# Patient Record
Sex: Male | Born: 1975 | Race: Black or African American | Hispanic: No | Marital: Single | Smoking: Never smoker
Health system: Southern US, Community
[De-identification: ages and names within clinical notes are randomized; demographics above are authoritative.]

## PROBLEM LIST (undated history)

## (undated) DIAGNOSIS — H8309 Labyrinthitis, unspecified ear: Secondary | ICD-10-CM

## (undated) DIAGNOSIS — T7840XA Allergy, unspecified, initial encounter: Secondary | ICD-10-CM

## (undated) DIAGNOSIS — K469 Unspecified abdominal hernia without obstruction or gangrene: Secondary | ICD-10-CM

## (undated) DIAGNOSIS — G5600 Carpal tunnel syndrome, unspecified upper limb: Secondary | ICD-10-CM

## (undated) DIAGNOSIS — K219 Gastro-esophageal reflux disease without esophagitis: Secondary | ICD-10-CM

## (undated) DIAGNOSIS — M1990 Unspecified osteoarthritis, unspecified site: Secondary | ICD-10-CM

## (undated) DIAGNOSIS — I10 Essential (primary) hypertension: Secondary | ICD-10-CM

## (undated) DIAGNOSIS — G4730 Sleep apnea, unspecified: Secondary | ICD-10-CM

## (undated) DIAGNOSIS — F419 Anxiety disorder, unspecified: Secondary | ICD-10-CM

## (undated) DIAGNOSIS — R51 Headache: Secondary | ICD-10-CM

## (undated) DIAGNOSIS — E876 Hypokalemia: Secondary | ICD-10-CM

## (undated) DIAGNOSIS — J329 Chronic sinusitis, unspecified: Secondary | ICD-10-CM

## (undated) DIAGNOSIS — G43909 Migraine, unspecified, not intractable, without status migrainosus: Secondary | ICD-10-CM

## (undated) DIAGNOSIS — F32A Depression, unspecified: Secondary | ICD-10-CM

## (undated) DIAGNOSIS — T63301A Toxic effect of unspecified spider venom, accidental (unintentional), initial encounter: Secondary | ICD-10-CM

## (undated) DIAGNOSIS — F329 Major depressive disorder, single episode, unspecified: Secondary | ICD-10-CM

## (undated) HISTORY — DX: Chronic sinusitis, unspecified: J32.9

## (undated) HISTORY — DX: Major depressive disorder, single episode, unspecified: F32.9

## (undated) HISTORY — DX: Allergy, unspecified, initial encounter: T78.40XA

## (undated) HISTORY — DX: Headache: R51

## (undated) HISTORY — DX: Unspecified abdominal hernia without obstruction or gangrene: K46.9

## (undated) HISTORY — DX: Anxiety disorder, unspecified: F41.9

## (undated) HISTORY — DX: Labyrinthitis, unspecified ear: H83.09

## (undated) HISTORY — DX: Unspecified osteoarthritis, unspecified site: M19.90

## (undated) HISTORY — DX: Hypokalemia: E87.6

## (undated) HISTORY — DX: Essential (primary) hypertension: I10

## (undated) HISTORY — DX: Carpal tunnel syndrome, unspecified upper limb: G56.00

## (undated) HISTORY — DX: Toxic effect of unspecified spider venom, accidental (unintentional), initial encounter: T63.301A

## (undated) HISTORY — DX: Gastro-esophageal reflux disease without esophagitis: K21.9

## (undated) HISTORY — DX: Depression, unspecified: F32.A

---
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
## 2003-09-10 ENCOUNTER — Emergency Department (HOSPITAL_COMMUNITY): Admission: EM | Admit: 2003-09-10 | Discharge: 2003-09-10 | Payer: Self-pay

## 2004-01-14 ENCOUNTER — Emergency Department (HOSPITAL_COMMUNITY): Admission: EM | Admit: 2004-01-14 | Discharge: 2004-01-14 | Payer: Self-pay | Admitting: Emergency Medicine

## 2006-08-31 ENCOUNTER — Emergency Department (HOSPITAL_COMMUNITY): Admission: EM | Admit: 2006-08-31 | Discharge: 2006-08-31 | Payer: Self-pay | Admitting: Family Medicine

## 2009-10-21 ENCOUNTER — Emergency Department (HOSPITAL_COMMUNITY): Admission: EM | Admit: 2009-10-21 | Discharge: 2009-10-21 | Payer: Self-pay | Admitting: Emergency Medicine

## 2010-07-09 ENCOUNTER — Encounter: Admission: RE | Admit: 2010-07-09 | Discharge: 2010-07-09 | Payer: Self-pay | Admitting: Internal Medicine

## 2010-09-19 ENCOUNTER — Encounter
Admission: RE | Admit: 2010-09-19 | Discharge: 2010-09-19 | Payer: Self-pay | Source: Home / Self Care | Attending: Surgery | Admitting: Surgery

## 2010-12-21 LAB — COMPREHENSIVE METABOLIC PANEL
Albumin: 4.1 g/dL (ref 3.5–5.2)
Alkaline Phosphatase: 70 U/L (ref 39–117)
BUN: 9 mg/dL (ref 6–23)
Potassium: 4.2 mEq/L (ref 3.5–5.1)
Sodium: 134 mEq/L — ABNORMAL LOW (ref 135–145)
Total Protein: 7.4 g/dL (ref 6.0–8.3)

## 2010-12-21 LAB — COMPREHENSIVE METABOLIC PANEL WITH GFR
ALT: 16 U/L (ref 0–53)
AST: 20 U/L (ref 0–37)
CO2: 27 meq/L (ref 19–32)
Calcium: 9.1 mg/dL (ref 8.4–10.5)
Chloride: 99 meq/L (ref 96–112)
Creatinine, Ser: 1.28 mg/dL (ref 0.4–1.5)
GFR calc Af Amer: >60 mL/min (ref >60)
GFR calc non Af Amer: >60 mL/min (ref >60)
Glucose, Bld: 96 mg/dL (ref 70–99)
Total Bilirubin: 1 mg/dL (ref 0.3–1.2)

## 2010-12-21 LAB — URINALYSIS, ROUTINE W REFLEX MICROSCOPIC
Bilirubin Urine: NEGATIVE
Glucose, UA: NEGATIVE mg/dL
Hgb urine dipstick: NEGATIVE
Ketones, ur: NEGATIVE mg/dL
Nitrite: NEGATIVE
Protein, ur: NEGATIVE mg/dL
Specific Gravity, Urine: 1.011 (ref 1.005–1.030)
Urobilinogen, UA: 0.2 mg/dL (ref 0.0–1.0)
pH: 7.5 (ref 5.0–8.0)

## 2010-12-21 LAB — DIFFERENTIAL
Basophils Absolute: 0 K/uL (ref 0.0–0.1)
Basophils Relative: 1 % (ref 0–1)
Eosinophils Absolute: 0.1 K/uL (ref 0.0–0.7)
Eosinophils Relative: 3 % (ref 0–5)
Lymphocytes Relative: 34 % (ref 12–46)
Lymphs Abs: 1.3 K/uL (ref 0.7–4.0)
Monocytes Absolute: 0.3 10*3/uL (ref 0.1–1.0)
Monocytes Relative: 9 % (ref 3–12)
Neutro Abs: 2 10*3/uL (ref 1.7–7.7)
Neutrophils Relative %: 53 % (ref 43–77)

## 2010-12-21 LAB — LIPASE, BLOOD: Lipase: 17 U/L (ref 11–59)

## 2010-12-21 LAB — POCT CARDIAC MARKERS
CKMB, poc: <1 ng/mL — ABNORMAL LOW (ref 1.0–8.0)
CKMB, poc: <1 ng/mL — ABNORMAL LOW (ref 1.0–8.0)
Myoglobin, poc: 82.8 ng/mL (ref 12–200)
Myoglobin, poc: 93.4 ng/mL (ref 12–200)
Troponin i, poc: 0.06 ng/mL (ref 0.00–0.09)
Troponin i, poc: <0.05 ng/mL (ref 0.00–0.09)

## 2010-12-21 LAB — CBC
HCT: 45.7 % (ref 39.0–52.0)
Hemoglobin: 15.2 g/dL (ref 13.0–17.0)
MCHC: 33.3 g/dL (ref 30.0–36.0)
MCV: 83.9 fL (ref 78.0–100.0)
Platelets: 232 10*3/uL (ref 150–400)
RBC: 5.45 MIL/uL (ref 4.22–5.81)
RDW: 14 % (ref 11.5–15.5)
WBC: 3.7 K/uL — ABNORMAL LOW (ref 4.0–10.5)

## 2010-12-21 LAB — D-DIMER, QUANTITATIVE: D-Dimer, Quant: <0.22 ug{FEU}/mL (ref 0.00–0.48)

## 2011-01-21 ENCOUNTER — Other Ambulatory Visit (HOSPITAL_COMMUNITY): Payer: Self-pay | Admitting: Surgery

## 2011-01-21 ENCOUNTER — Other Ambulatory Visit: Payer: Self-pay | Admitting: Surgery

## 2011-01-21 ENCOUNTER — Ambulatory Visit (HOSPITAL_COMMUNITY)
Admission: RE | Admit: 2011-01-21 | Discharge: 2011-01-21 | Disposition: A | Discharge status: Home / Self Care | Payer: BC Managed Care – PPO | Source: Ambulatory Visit | Attending: Surgery | Admitting: Surgery

## 2011-01-21 ENCOUNTER — Encounter (HOSPITAL_COMMUNITY): Payer: BC Managed Care – PPO

## 2011-01-21 DIAGNOSIS — K4090 Unilateral inguinal hernia, without obstruction or gangrene, not specified as recurrent: Secondary | ICD-10-CM | POA: Insufficient documentation

## 2011-01-21 DIAGNOSIS — Z01818 Encounter for other preprocedural examination: Secondary | ICD-10-CM | POA: Insufficient documentation

## 2011-01-21 DIAGNOSIS — Z01812 Encounter for preprocedural laboratory examination: Secondary | ICD-10-CM | POA: Insufficient documentation

## 2011-01-21 DIAGNOSIS — K469 Unspecified abdominal hernia without obstruction or gangrene: Secondary | ICD-10-CM

## 2011-01-21 DIAGNOSIS — Z01810 Encounter for preprocedural cardiovascular examination: Secondary | ICD-10-CM | POA: Insufficient documentation

## 2011-01-21 LAB — BASIC METABOLIC PANEL
BUN: 15 mg/dL (ref 6–23)
CO2: 31 mEq/L (ref 19–32)
Chloride: 98 mEq/L (ref 96–112)
Creatinine, Ser: 1.21 mg/dL (ref 0.4–1.5)
Glucose, Bld: 92 mg/dL (ref 70–99)

## 2011-01-23 LAB — MRSA CULTURE

## 2011-01-30 ENCOUNTER — Ambulatory Visit (HOSPITAL_COMMUNITY)
Admission: RE | Admit: 2011-01-30 | Discharge: 2011-01-30 | Disposition: A | Discharge status: Home / Self Care | Payer: BC Managed Care – PPO | Source: Ambulatory Visit | Attending: Surgery | Admitting: Surgery

## 2011-01-30 DIAGNOSIS — Z79899 Other long term (current) drug therapy: Secondary | ICD-10-CM | POA: Insufficient documentation

## 2011-01-30 DIAGNOSIS — K429 Umbilical hernia without obstruction or gangrene: Secondary | ICD-10-CM | POA: Insufficient documentation

## 2011-01-30 DIAGNOSIS — K219 Gastro-esophageal reflux disease without esophagitis: Secondary | ICD-10-CM | POA: Insufficient documentation

## 2011-01-30 DIAGNOSIS — K4090 Unilateral inguinal hernia, without obstruction or gangrene, not specified as recurrent: Secondary | ICD-10-CM | POA: Insufficient documentation

## 2011-01-30 DIAGNOSIS — I10 Essential (primary) hypertension: Secondary | ICD-10-CM | POA: Insufficient documentation

## 2011-01-30 HISTORY — PX: HERNIA REPAIR: SHX51

## 2011-02-01 NOTE — Op Note (Signed)
Dylan Guerrero, DROTAR                ACCOUNT NO.:  [id number]  MEDICAL RECORD NO.:  [id number]           PATIENT TYPE:  O  LOCATION:  DAYL                         FACILITY:  [facility] [place]  PHYSICIAN:  Wilmon Arms. Corliss Skains, M.D. DATE OF BIRTH:  1976/06/15  DATE OF PROCEDURE:  01/30/2011 DATE OF DISCHARGE:                              OPERATIVE REPORT   PREOPERATIVE DIAGNOSES: 1. Right inguinal hernia. 2. Umbilical hernia.  POSTOPERATIVE DIAGNOSES: 1. Right inguinal hernia. 2. Umbilical hernia.  PROCEDURE PERFORMED: 1. Right inguinal hernia repair with mesh. 2. Umbilical hernia repair with mesh.  SURGEON:  Wilmon Arms. Dulcy Sida, M.D.  ANESTHESIA:  General  INDICATIONS:  This is a 35 year old male who presents with a protruding umbilicus and some pain in his right groin.  He was found to have a reducible right inguinal hernia as well as a reducible umbilical hernia. The patient presents now for repair.  DESCRIPTION OF PROCEDURE:  The patient was brought to the operating room, placed in supine position on the operating table.  After an adequate level of general anesthesia was obtained, the patient's abdomen and right groin were shaved, prepped with ChloraPrep and Betadine and draped in sterile fashion.  Time-out was taken to ensure the proper patient, proper procedure.  We infiltrated the area of the right inguinal ligament with 0.25% Marcaine with epinephrine.  An oblique incision was made.  Dissection was carried down to the fascia. Then, we opened the external oblique fascia along the direction of its fibers down the external ring.  The spermatic cord was circumferentially dissected and retracted with a Penrose drain.  The floor of the inguinal canal was intact.  We skeletonized the spermatic cord and dissected a moderate-sized indirect hernia sac free.  We reduced this up to the internal ring.  We used a Parietex ProGrip mesh and secured this with 2- 0 Vicryl at its apex down to the  pubic tubercle.  We then positioned the mesh in the proper orientation and secured it with ProGrip self-adherent mesh.  This mesh seemed to be in good placement.  We closed the fascia with 2-0 Vicryl.  The 3-0 Vicryl was used to close the subcutaneous tissues and 4-0 Monocryl was used to close the skin.  We then turned our attention to the umbilicus.  The patient had a protuberant umbilicus.  We made a transverse incision above the umbilicus.  Dissection was carried down into the subcutaneous tissues with cautery.  We identified the hernia sac.  We dissected all the way down to the fascia.  The defect initially seemed to be about 2 cm across.  We opened a 4.3-cm Proceed ventral patch.  However, as we were clearing the fascia in all directions to place the mesh, we identified 2 further defects.  One was above and one was below this original hernia defect.  We dissected these completely and then connected all the defects to make one large common defect.  This measured 6 cm.  All these hernias contained only omentum.  We did enter the peritoneal cavity. Therefore, we made the decision not to use the Proceed ventral patch as it  was too small.  We closed the fascia in vertical fashion with figure- of-eight interrupted #1 Novofil sutures.  We then cut Ultrapro into 7 x 3 cm strip and secured this as onlay with 0 Novofil sutures.  We closed the subcutaneous tissue anterior to the mesh with 3-0 Vicryl.  The base of the umbilicus was tacked down with 3-0 Vicryl.  A 4-0 Monocryl was used to close the skin.  Steri-Strips and clean dressings were applied. The patient was then extubated and brought to recovery room in stable condition.  All sponge, instrument and needle counts were correct.     Wilmon Arms. Corliss Skains, M.D.     MKT/MEDQ  D:  01/30/2011  T:  01/30/2011  Job:  [id number]  Electronically Signed by Manus Rudd M.D. on 02/01/2011 10:09:21 PM

## 2011-02-07 ENCOUNTER — Emergency Department (HOSPITAL_COMMUNITY)
Admission: EM | Admit: 2011-02-07 | Discharge: 2011-02-07 | Disposition: A | Discharge status: Home / Self Care | Payer: BC Managed Care – PPO | Attending: Emergency Medicine | Admitting: Emergency Medicine

## 2011-02-07 DIAGNOSIS — Z09 Encounter for follow-up examination after completed treatment for conditions other than malignant neoplasm: Secondary | ICD-10-CM | POA: Insufficient documentation

## 2011-02-07 DIAGNOSIS — T8132XA Disruption of internal operation (surgical) wound, not elsewhere classified, initial encounter: Secondary | ICD-10-CM | POA: Insufficient documentation

## 2011-02-07 DIAGNOSIS — I10 Essential (primary) hypertension: Secondary | ICD-10-CM | POA: Insufficient documentation

## 2011-02-07 DIAGNOSIS — Y838 Other surgical procedures as the cause of abnormal reaction of the patient, or of later complication, without mention of misadventure at the time of the procedure: Secondary | ICD-10-CM | POA: Insufficient documentation

## 2011-02-07 DIAGNOSIS — T81329A Deep disruption or dehiscence of operation wound, unspecified, initial encounter: Secondary | ICD-10-CM | POA: Insufficient documentation

## 2011-02-12 ENCOUNTER — Emergency Department (HOSPITAL_COMMUNITY)
Admission: EM | Admit: 2011-02-12 | Discharge: 2011-02-12 | Disposition: A | Discharge status: Home / Self Care | Payer: BC Managed Care – PPO | Attending: Emergency Medicine | Admitting: Emergency Medicine

## 2011-02-12 DIAGNOSIS — IMO0002 Reserved for concepts with insufficient information to code with codable children: Secondary | ICD-10-CM | POA: Insufficient documentation

## 2011-02-12 DIAGNOSIS — Y838 Other surgical procedures as the cause of abnormal reaction of the patient, or of later complication, without mention of misadventure at the time of the procedure: Secondary | ICD-10-CM | POA: Insufficient documentation

## 2011-10-21 ENCOUNTER — Encounter (INDEPENDENT_AMBULATORY_CARE_PROVIDER_SITE_OTHER): Payer: Self-pay | Admitting: Surgery

## 2011-10-22 ENCOUNTER — Ambulatory Visit (INDEPENDENT_AMBULATORY_CARE_PROVIDER_SITE_OTHER): Payer: BC Managed Care – PPO | Admitting: Surgery

## 2011-10-22 ENCOUNTER — Encounter (INDEPENDENT_AMBULATORY_CARE_PROVIDER_SITE_OTHER): Payer: Self-pay | Admitting: Surgery

## 2011-10-22 VITALS — BP 132/90 | HR 66 | Temp 98.0°F | Resp 18 | Ht 74.0 in | Wt 242.4 lb

## 2011-10-22 DIAGNOSIS — R109 Unspecified abdominal pain: Secondary | ICD-10-CM

## 2011-10-22 DIAGNOSIS — R1033 Periumbilical pain: Secondary | ICD-10-CM

## 2011-10-22 NOTE — Progress Notes (Signed)
This patient underwent right inguinal hernia repair with mesh as well as an umbilical hernia repair with mesh on 01/30/11. The umbilical hernia actually had 3 separate fascial defects which were all connected to a long 6 cm defect. This was repaired with figure-of-eight interrupted #1 Novafil sutures reinforced with onlay ultra Pro mesh. The patient had been doing quite well with no symptoms until about a week ago. He felt a pulling sensation just to the right of his umbilicus and then describes what he feels as warm fluid on his skin. However there was no actual drainage on the skin, just the sensation. He comes in today for evaluation. The patient is currently taking Naprosyn for tendinitis in his arm. He continues to work as a Midwife.  On examination, his incisions are well-healed with no sign of infection.  No obvious recurrent umbilical hernia.  No movement or protrusion with valsalva maneuver.    Imp:  No obvious hernia recurrence.  He may have pulled on one of the fascial sutures and caused some irritation to the muscle or a sensory nerve.    Recs:  NSAIDs, abdominal binder, no heavy lifting.  Recheck in 3 weeks.  Wilmon Arms. Corliss Skains, MD, [facility]  10/22/2011 9:40 AM

## 2011-10-22 NOTE — Patient Instructions (Signed)
Elastic abdominal binder may help with the pulling sensation. Use Naprosyn on a regular basis

## 2011-11-12 ENCOUNTER — Encounter (INDEPENDENT_AMBULATORY_CARE_PROVIDER_SITE_OTHER): Payer: Self-pay | Admitting: Surgery

## 2011-11-12 ENCOUNTER — Encounter (INDEPENDENT_AMBULATORY_CARE_PROVIDER_SITE_OTHER): Payer: BC Managed Care – PPO | Admitting: Surgery

## 2011-11-12 ENCOUNTER — Ambulatory Visit (INDEPENDENT_AMBULATORY_CARE_PROVIDER_SITE_OTHER): Payer: BC Managed Care – PPO | Admitting: Surgery

## 2011-11-12 VITALS — BP 126/82 | HR 80 | Temp 98.6°F | Resp 14 | Ht 74.0 in | Wt 244.2 lb

## 2011-11-12 DIAGNOSIS — R109 Unspecified abdominal pain: Secondary | ICD-10-CM

## 2011-11-12 DIAGNOSIS — R1033 Periumbilical pain: Secondary | ICD-10-CM

## 2011-11-12 NOTE — Progress Notes (Signed)
The right periumbilical pain is better with the abdominal binder.  No sign of recurrent hernia.   Incision well-healed  Resume full activity Follow-up PRN

## 2013-01-09 ENCOUNTER — Ambulatory Visit (INDEPENDENT_AMBULATORY_CARE_PROVIDER_SITE_OTHER): Payer: BC Managed Care – PPO | Admitting: Diagnostic Neuroimaging

## 2013-01-09 ENCOUNTER — Encounter: Payer: Self-pay | Admitting: Diagnostic Neuroimaging

## 2013-01-09 VITALS — BP 128/85 | HR 87 | Temp 99.0°F | Ht 75.0 in | Wt 252.0 lb

## 2013-01-09 DIAGNOSIS — G43109 Migraine with aura, not intractable, without status migrainosus: Secondary | ICD-10-CM

## 2013-01-09 MED ORDER — SUMATRIPTAN SUCCINATE 100 MG PO TABS: 100.0000 mg | ORAL_TABLET | Freq: Once | ORAL | Status: DC | PRN

## 2013-01-09 MED ORDER — PROPRANOLOL HCL ER 60 MG PO CP24: 60.0000 mg | ORAL_CAPSULE | Freq: Every day | ORAL | Status: DC

## 2013-01-09 NOTE — Patient Instructions (Signed)
Migraine Headache A migraine headache is an intense, throbbing pain on one or both sides of your head. A migraine can last for 30 minutes to several hours. CAUSES  The exact cause of a migraine headache is not always known. However, a migraine may be caused when nerves in the brain become irritated and release chemicals that cause inflammation. This causes pain. SYMPTOMS  Pain on one or both sides of your head.  Pulsating or throbbing pain.  Severe pain that prevents daily activities.  Pain that is aggravated by any physical activity.  Nausea, vomiting, or both.  Dizziness.  Pain with exposure to bright lights, loud noises, or activity.  General sensitivity to bright lights, loud noises, or smells. Before you get a migraine, you may get warning signs that a migraine is coming (aura). An aura may include:  Seeing flashing lights.  Seeing bright spots, halos, or zig-zag lines.  Having tunnel vision or blurred vision.  Having feelings of numbness or tingling.  Having trouble talking.  Having muscle weakness. MIGRAINE TRIGGERS  Alcohol.  Smoking.  Stress.  Menstruation.  Aged cheeses.  Foods or drinks that contain nitrates, glutamate, aspartame, or tyramine.  Lack of sleep.  Chocolate.  Caffeine.  Hunger.  Physical exertion.  Fatigue.  Medicines used to treat chest pain (nitroglycerine), birth control pills, estrogen, and some blood pressure medicines. DIAGNOSIS  A migraine headache is often diagnosed based on:  Symptoms.  Physical examination.  A CT scan or MRI of your head. TREATMENT Medicines may be given for pain and nausea. Medicines can also be given to help prevent recurrent migraines.  HOME CARE INSTRUCTIONS  Only take over-the-counter or prescription medicines for pain or discomfort as directed by your caregiver. The use of long-term narcotics is not recommended.  Lie down in a dark, quiet room when you have a migraine.  Keep a journal  to find out what may trigger your migraine headaches. For example, write down:  What you eat and drink.  How much sleep you get.  Any change to your diet or medicines.  Limit alcohol consumption.  Quit smoking if you smoke.  Get 7 to 9 hours of sleep, or as recommended by your caregiver.  Limit stress.  Keep lights dim if bright lights bother you and make your migraines worse. SEEK IMMEDIATE MEDICAL CARE IF:   Your migraine becomes severe.  You have a fever.  You have a stiff neck.  You have vision loss.  You have muscular weakness or loss of muscle control.  You start losing your balance or have trouble walking.  You feel faint or pass out.  You have severe symptoms that are different from your first symptoms. MAKE SURE YOU:   Understand these instructions.  Will watch your condition.  Will get help right away if you are not doing well or get worse. Document Released: 09/21/2005 Document Revised: 12/14/2011 Document Reviewed: 09/11/2011 Exit[employer] Patient Information 2013 [employer].  

## 2013-01-09 NOTE — Progress Notes (Signed)
[facility]  PATIENT: Dylan Guerrero DOB: 03/15/1976  REFERRING CLINICIAN: Pollyann Kennedy HISTORY FROM: patient REASON FOR VISIT: new consult   HISTORICAL  CHIEF COMPLAINT:  Chief Complaint  Patient presents with  . Headache  . Dizziness    HISTORY OF PRESENT ILLNESS:   37 year old right-handed male with history of hypertension, here for evaluation of headaches and dizziness.  Patient reports one to 2 year history of intermittent right-sided headaches, pounding throbbing sensation, radiating to the right eye. Headaches last up to 30 minutes at a time. He has hazy vision, flashing light sensation, photophobia, rocking back and forth sensation. No nausea vomiting or sensitivity to sound. He has up to 20 days of these headaches and symptoms per month.  Patient has tried meclizine, tramadol, ibuprofen without relief. In June 2013 he was out of town near [address], had severe headache and vertigo, had MRI of the brain which is unremarkable.  Patient has not tried topiramate, propranolol, or any sumatriptan for the symptoms.  REVIEW OF SYSTEMS: Full 14 system review of systems performed and notable only for spinning sensation, skin moles, eye pain, easy bruising, ecchymosis, allergies, dizziness, headache.  ALLERGIES: Allergies  Allergen Reactions  . Ketoprofen Other (See Comments)    Causes muscle and stomach cramps    HOME MEDICATIONS: Outpatient Prescriptions Prior to Visit  Medication Sig Dispense Refill  . ibuprofen (ADVIL,MOTRIN) 800 MG tablet Take 800 mg by mouth every 8 (eight) hours as needed.      . loratadine (CLARITIN) 10 MG tablet Take 10 mg by mouth daily.      . Olopatadine HCl (PATADAY) 0.2 % SOLN Apply to eye daily.      Marland Kitchen dexlansoprazole (DEXILANT) 60 MG capsule Take 60 mg by mouth daily.      . fluticasone (FLONASE) 50 MCG/ACT nasal spray Place 2 sprays into the nose 2 (two) times daily.      . hydrochlorothiazide (HYDRODIURIL)  25 MG tablet Take 25 mg by mouth daily.      . naproxen (NAPROSYN) 500 MG tablet Take 500 mg by mouth 2 (two) times daily with a meal.       No facility-administered medications prior to visit.    PAST MEDICAL HISTORY: Past Medical History  Diagnosis Date  . GERD (gastroesophageal reflux disease)   . Hypertension   . Allergy   . Spider bite   . Headache   . Arthritis   . Abdominal pain   . Hernia   . Carpal tunnel syndrome   . Infection of the inner ear   . Sinus infection   . Hypokalemia     PAST SURGICAL HISTORY: Past Surgical History  Procedure Laterality Date  . Appendectomy  1984  . Hernia repair  01/30/2011    Right Inguinal Hernia repair with mesh  . Hernia repair  01/30/2011    Umbilical Hernia repair with meash    FAMILY HISTORY: Family History  Problem Relation Age of Onset  . Scleroderma Mother   . Diabetes    . Hypertension      SOCIAL HISTORY:  History   Social History  . Marital Status: Single    Spouse Name: N/A    Number of Children: 0  . Years of Education: college   Occupational History  . BUS DRIVER [employer]   Social History Main Topics  . Smoking status: Never Smoker   . Smokeless tobacco: Never Used  . Alcohol Use: No  Comment: quit 10/05/2009  . Drug Use: No  . Sexually Active: Not on file   Other Topics Concern  . Not on file   Social History Narrative   Pt lives at home alone.   Caffeine Use- Does not use.     PHYSICAL EXAM  Filed Vitals:   01/09/13 1124 01/09/13 1129  BP: 132/87 128/85  Pulse: 91 87  Temp: 99 F (37.2 C)   TempSrc: Oral   Height: 6\' 3"  (1.905 m)   Weight: 252 lb (114.306 kg)    Body mass index is 31.5 kg/(m^2).  GENERAL EXAM: Patient is in no distress  CARDIOVASCULAR: Regular rate and rhythm, no murmurs, no carotid bruits  NEUROLOGIC: MENTAL STATUS: awake, alert, language fluent, comprehension intact, naming intact CRANIAL NERVE: no papilledema on fundoscopic exam,  pupils equal and reactive to light, visual fields full to confrontation, extraocular muscles intact, no nystagmus, facial sensation and strength symmetric, uvula midline, shoulder shrug symmetric, tongue midline. MOTOR: normal bulk and tone, full strength in the BUE, BLE SENSORY: normal and symmetric to light touch, pinprick, temperature, vibration COORDINATION: finger-nose-finger, fine finger movements normal REFLEXES: deep tendon reflexes present and symmetric GAIT/STATION: narrow based gait; able to walk on toes, heels and tandem; romberg is negative   DIAGNOSTIC DATA (LABS, IMAGING, TESTING) - I reviewed patient records, labs, notes, testing and imaging myself where available.  Lab Results  Component Value Date   WBC 3.7* 10/21/2009   HGB 15.2 10/21/2009   HCT 45.7 10/21/2009   MCV 83.9 10/21/2009   PLT 232 10/21/2009      Component Value Date/Time   NA 138 01/21/2011 1010   K 3.5 01/21/2011 1010   CL 98 01/21/2011 1010   CO2 31 01/21/2011 1010   GLUCOSE 92 01/21/2011 1010   BUN 15 01/21/2011 1010   CREATININE 1.21 01/21/2011 1010   CALCIUM 9.4 01/21/2011 1010   PROT 7.4 10/21/2009 0935   ALBUMIN 4.1 10/21/2009 0935   AST 20 10/21/2009 0935   ALT 16 10/21/2009 0935   ALKPHOS 70 10/21/2009 0935   BILITOT 1.0 10/21/2009 0935   GFRNONAA >60 01/21/2011 1010   GFRAA  Value: >60        The eGFR has been calculated using the MDRD equation. This calculation has not been validated in all clinical situations. eGFR's persistently <60 mL/min signify possible Chronic Kidney Disease. 01/21/2011 1010   No results found for this basename: CHOL, HDL, LDLCALC, LDLDIRECT, TRIG, CHOLHDL   No results found for this basename: HGBA1C   No results found for this basename: VITAMINB12   No results found for this basename: TSH     ASSESSMENT AND PLAN  37 y.o. year old male  has a past medical history of GERD (gastroesophageal reflux disease); Hypertension; Allergy; Spider bite; Headache; Arthritis; Abdominal  pain; Hernia; Carpal tunnel syndrome; Infection of the inner ear; Sinus infection; and Hypokalemia. here with one to 2 year history of intermittent headaches with migraine features. Neurologic exam unremarkable. Patient reports normal MRI of the brain in June 2013, although do not have the report or images to review myself.  PLAN: 1. Start propranolol plus sumatriptan 2. Migraine education, triggers and treatment strategies reviewed   Suanne Marker, MD 01/09/2013, 11:41 AM Certified in Neurology, Neurophysiology and Neuroimaging  [facility] [address], [address] [place], [place] [address] [phone]

## 2013-01-12 ENCOUNTER — Encounter: Payer: Self-pay | Admitting: Diagnostic Neuroimaging

## 2013-01-12 NOTE — Telephone Encounter (Signed)
I called pt and he is having tingling and jerking of both legs since starting the propranolol (Monday night), increasingly worse as time as gone on.  He takes another dose tonight at 1700.   I told him that I would send message to Dr. Marjory Lies.   Did not see these SE in reference book.  Will call back or will email.

## 2013-08-10 ENCOUNTER — Other Ambulatory Visit: Payer: Self-pay

## 2014-01-21 ENCOUNTER — Other Ambulatory Visit: Payer: Self-pay | Admitting: Diagnostic Neuroimaging

## 2015-09-15 ENCOUNTER — Encounter (HOSPITAL_COMMUNITY): Payer: Self-pay

## 2015-09-15 ENCOUNTER — Emergency Department (HOSPITAL_COMMUNITY)
Admission: EM | Admit: 2015-09-15 | Discharge: 2015-09-16 | Disposition: A | Discharge status: Home / Self Care | Payer: BC Managed Care – PPO | Attending: Emergency Medicine | Admitting: Emergency Medicine

## 2015-09-15 DIAGNOSIS — R05 Cough: Secondary | ICD-10-CM | POA: Insufficient documentation

## 2015-09-15 DIAGNOSIS — M1990 Unspecified osteoarthritis, unspecified site: Secondary | ICD-10-CM | POA: Diagnosis not present

## 2015-09-15 DIAGNOSIS — Z8639 Personal history of other endocrine, nutritional and metabolic disease: Secondary | ICD-10-CM | POA: Insufficient documentation

## 2015-09-15 DIAGNOSIS — Z79899 Other long term (current) drug therapy: Secondary | ICD-10-CM | POA: Insufficient documentation

## 2015-09-15 DIAGNOSIS — G43109 Migraine with aura, not intractable, without status migrainosus: Secondary | ICD-10-CM | POA: Diagnosis not present

## 2015-09-15 DIAGNOSIS — Z87828 Personal history of other (healed) physical injury and trauma: Secondary | ICD-10-CM | POA: Diagnosis not present

## 2015-09-15 DIAGNOSIS — R002 Palpitations: Secondary | ICD-10-CM | POA: Insufficient documentation

## 2015-09-15 DIAGNOSIS — Z8709 Personal history of other diseases of the respiratory system: Secondary | ICD-10-CM | POA: Diagnosis not present

## 2015-09-15 DIAGNOSIS — Z7951 Long term (current) use of inhaled steroids: Secondary | ICD-10-CM | POA: Insufficient documentation

## 2015-09-15 DIAGNOSIS — K219 Gastro-esophageal reflux disease without esophagitis: Secondary | ICD-10-CM | POA: Insufficient documentation

## 2015-09-15 DIAGNOSIS — I10 Essential (primary) hypertension: Secondary | ICD-10-CM | POA: Insufficient documentation

## 2015-09-15 DIAGNOSIS — R0602 Shortness of breath: Secondary | ICD-10-CM | POA: Insufficient documentation

## 2015-09-15 DIAGNOSIS — R51 Headache: Secondary | ICD-10-CM | POA: Diagnosis present

## 2015-09-15 MED ORDER — SODIUM CHLORIDE 0.9 % IV BOLUS (SEPSIS)
500.0000 mL | Freq: Once | INTRAVENOUS | Status: AC
  Administered 2015-09-15: 500 mL via INTRAVENOUS

## 2015-09-15 MED ORDER — PROMETHAZINE HCL 25 MG/ML IJ SOLN
25.0000 mg | Freq: Once | INTRAMUSCULAR | Status: AC
  Administered 2015-09-15: 25 mg via INTRAVENOUS
  Filled 2015-09-15: qty 1

## 2015-09-15 MED ORDER — DIPHENHYDRAMINE HCL 50 MG/ML IJ SOLN
25.0000 mg | Freq: Once | INTRAMUSCULAR | Status: AC
  Administered 2015-09-15: 25 mg via INTRAVENOUS
  Filled 2015-09-15: qty 1

## 2015-09-15 NOTE — ED Notes (Signed)
MD at bedside. 

## 2015-09-15 NOTE — ED Provider Notes (Signed)
CSN: [id number]     Arrival date & time 09/15/15  2201 History   First MD Initiated Contact with Patient 09/15/15 2219     Chief Complaint  Patient presents with  . Migraine     (Consider location/radiation/quality/duration/timing/severity/associated sxs/prior Treatment) Patient is a 39 y.o. male presenting with migraines. The history is provided by the patient.  Migraine This is a recurrent problem. Associated symptoms include headaches and shortness of breath. Pertinent negatives include no chest pain and no abdominal pain.   patient presents with right-sided headache. Began around 2 days ago. Has a history of somewhat frequent headaches. He is seemed neurologist for it and is on Imitrex and propranolol. States he started back on this around 2 weeks ago after a break. Has had some nausea. Some photophobia. No numbness weakness. States he does have some chronic vision changes in his right eye. It is been associated with migraines but is very limited assessment of the headache. He has occasionally felt his heart racing. No real chest pain. No trouble breathing. Occasional shortness of breath. Has had occasional cough. No abdominal pain. No trauma. No relief with his propranolol or Imitrex  Past Medical History  Diagnosis Date  . GERD (gastroesophageal reflux disease)   . Hypertension   . Allergy   . Spider bite   . Headache(784.0)   . Arthritis   . Abdominal pain   . Hernia   . Carpal tunnel syndrome   . Infection of the inner ear   . Sinus infection   . Hypokalemia    Past Surgical History  Procedure Laterality Date  . Appendectomy  1984  . Hernia repair  01/30/2011    Right Inguinal Hernia repair with mesh  . Hernia repair  01/30/2011    Umbilical Hernia repair with meash   Family History  Problem Relation Age of Onset  . Scleroderma Mother   . Diabetes    . Hypertension     Social History  Substance Use Topics  . Smoking status: Never Smoker   . Smokeless tobacco:  Never Used  . Alcohol Use: No     Comment: quit 10/05/2009    Review of Systems  Constitutional: Negative for activity change and appetite change.  Eyes: Positive for photophobia and visual disturbance. Negative for pain.  Respiratory: Positive for shortness of breath. Negative for chest tightness.   Cardiovascular: Negative for chest pain and leg swelling.  Gastrointestinal: Positive for nausea. Negative for vomiting, abdominal pain and diarrhea.  Genitourinary: Negative for flank pain.  Musculoskeletal: Negative for back pain and neck stiffness.  Skin: Negative for rash.  Neurological: Positive for headaches. Negative for weakness and numbness.  Psychiatric/Behavioral: Negative for behavioral problems.      Allergies  Ketoprofen  Home Medications   Prior to Admission medications   Medication Sig Start Date End Date Taking? Authorizing Provider  amLODipine (NORVASC) 10 MG tablet Take 10 mg by mouth daily.   Yes Historical Provider, MD  cycloSPORINE (RESTASIS) 0.05 % ophthalmic emulsion Place 1 drop into both eyes 2 (two) times daily.   Yes Historical Provider, MD  fluticasone (FLONASE) 50 MCG/ACT nasal spray Place 2 sprays into the nose 2 (two) times daily.   Yes Historical Provider, MD  hydrochlorothiazide (HYDRODIURIL) 25 MG tablet Take 25 mg by mouth daily.   Yes Historical Provider, MD  ibuprofen (ADVIL,MOTRIN) 800 MG tablet Take 800 mg by mouth every 8 (eight) hours as needed for moderate pain.    Yes Historical Provider, MD  loratadine (CLARITIN) 10 MG tablet Take 10 mg by mouth daily.   Yes Historical Provider, MD  meclizine (ANTIVERT) 25 MG tablet Take 25 mg by mouth 3 (three) times daily as needed for dizziness or nausea.    Yes Historical Provider, MD  Multiple Vitamins-Minerals (MENS MULTIVITAMIN PLUS PO) Take 1 tablet by mouth daily.   Yes Historical Provider, MD  naproxen (NAPROSYN) 500 MG tablet Take 500 mg by mouth 2 (two) times daily as needed for mild pain.    Yes  Historical Provider, MD  omeprazole (PRILOSEC) 20 MG capsule Take 20 mg by mouth daily.   Yes Historical Provider, MD  propranolol ER (INDERAL LA) 60 MG 24 hr capsule take 1 capsule by mouth once daily 01/21/14  Yes Suanne Marker, MD  SUMAtriptan (IMITREX) 100 MG tablet Take 1 tablet (100 mg total) by mouth once as needed for migraine. May repeat x 1 after 2 hours; maximum 2 tabs per day and 8 tabs per month 01/09/13  Yes Vikram R Penumalli, MD  traMADol (ULTRAM) 50 MG tablet Take 50 mg by mouth every 6 (six) hours as needed for pain.   Yes Historical Provider, MD  vitamin C (ASCORBIC ACID) 250 MG tablet Take 250 mg by mouth daily.   Yes Historical Provider, MD   BP 93/68 mmHg  Pulse 65  Temp(Src) 97.9 F (36.6 C) (Oral)  Resp 18  Ht  (1.88 m)  Wt 263 lb (119.296 kg)  BMI 33.75 kg/m2  SpO2 98% Physical Exam  Constitutional: He is oriented to person, place, and time. He appears well-developed and well-nourished.  HENT:  Head: Normocephalic and atraumatic.  Eyes: Pupils are equal, round, and reactive to light.  Neck: Normal range of motion. Neck supple.  Cardiovascular: Normal rate, regular rhythm and normal heart sounds.   No murmur heard. Pulmonary/Chest: Effort normal and breath sounds normal. He has no wheezes. He exhibits no tenderness.  Abdominal: Soft. Bowel sounds are normal. He exhibits no distension and no mass. There is no tenderness. There is no rebound and no guarding.  Musculoskeletal: Normal range of motion. He exhibits no edema.  Neurological: He is alert and oriented to person, place, and time. No cranial nerve deficit.  Skin: Skin is warm and dry.  Psychiatric: He has a normal mood and affect.  Nursing note and vitals reviewed.   ED Course  Procedures (including critical care time) Labs Review Labs Reviewed - No data to display  Imaging Review No results found. I have personally reviewed and evaluated these images and lab results as part of my medical  decision-making.   EKG Interpretation   Date/Time:  Sunday September 15 2015 22:04:19 EST Ventricular Rate:  86 PR Interval:  172 QRS Duration: 74 QT Interval:  354 QTC Calculation: 423 R Axis:   64 Text Interpretation:  Normal sinus rhythm Normal ECG Confirmed by  Rubin Payor  MD, Harrold Donath [phone]) on 09/15/2015 11:12:07 PM      MDM   Final diagnoses:  Migraine with aura and without status migrainosus, not intractable    Patient migraine. History of same. Feels better after treatment will discharge home.    Benjiman Core, MD 09/18/15 [phone]

## 2015-09-15 NOTE — ED Notes (Signed)
Pt states he has had a migraine all day on the right side and states he felt his HR speed up just when he was sitting down not doing anything. Denies any N/V, endorses sensitivity to light.

## 2015-09-16 MED ORDER — KETOROLAC TROMETHAMINE 30 MG/ML IJ SOLN
30.0000 mg | Freq: Once | INTRAMUSCULAR | Status: AC
  Administered 2015-09-16: 30 mg via INTRAVENOUS
  Filled 2015-09-16: qty 1

## 2015-09-16 NOTE — ED Notes (Signed)
Pt left at this time with all belongings.  

## 2015-09-16 NOTE — Discharge Instructions (Signed)

## 2015-10-01 ENCOUNTER — Encounter: Payer: Self-pay | Admitting: Diagnostic Neuroimaging

## 2015-10-01 ENCOUNTER — Ambulatory Visit (INDEPENDENT_AMBULATORY_CARE_PROVIDER_SITE_OTHER): Payer: BC Managed Care – PPO | Admitting: Diagnostic Neuroimaging

## 2015-10-01 VITALS — BP 112/79 | HR 71 | Ht 75.0 in | Wt 266.0 lb

## 2015-10-01 DIAGNOSIS — G43109 Migraine with aura, not intractable, without status migrainosus: Secondary | ICD-10-CM | POA: Diagnosis not present

## 2015-10-01 NOTE — Patient Instructions (Signed)
Thank you for coming to see [place] at [facility]. I hope we have been able to provide you high quality care today.  You may receive a patient satisfaction survey over the next few weeks. We would appreciate your feedback and comments so that we may continue to improve ourselves and the health of our patients.  - continue propranolol and naproxyn   ~~~~~~~~~~~~~~~~~~~~~~~~~~~~~~~~~~~~~~~~~~~~~~~~~~~~~~~~~~~~~~~~~  DR. Jaishon Krisher'S GUIDE TO HAPPY AND HEALTHY LIVING These are some of my general health and wellness recommendations. Some of them may apply to you better than others. Please use common sense as you try these suggestions and feel free to ask me any questions.   ACTIVITY/FITNESS Mental, social, emotional and physical stimulation are very important for brain and body health. Try learning a new activity (arts, music, language, sports, games).  Keep moving your body to the best of your abilities. You can do this at home, inside or outside, the park, community center, gym or anywhere you like. Consider a physical therapist or personal trainer to get started. Consider the app Sworkit. Fitness trackers such as smart-watches, smart-phones or Fitbits can help as well.   NUTRITION Eat more plants: colorful vegetables, nuts, seeds and berries.  Eat less sugar, salt, preservatives and processed foods.  Avoid toxins such as cigarettes and alcohol.  Drink water when you are thirsty. Warm water with a slice of lemon is an excellent morning drink to start the day.  Consider these websites for more information The Nutrition Source (https://www.henry-hernandez.biz/) Precision Nutrition (WindowBlog.ch)   RELAXATION Consider practicing mindfulness meditation or other relaxation techniques such as deep breathing, prayer, yoga, tai chi, massage. See website mindful.org or the apps Headspace or Calm to help get started.   SLEEP Try to  get at least 7-8+ hours sleep per day. Regular exercise and reduced caffeine will help you sleep better. Practice good sleep hygeine techniques. See website sleep.org for more information.   PLANNING Prepare estate planning, living will, healthcare POA documents. Sometimes this is best planned with the help of an attorney. Theconversationproject.org and agingwithdignity.org are excellent resources.\

## 2015-10-01 NOTE — Progress Notes (Signed)
[facility]  PATIENT: Dylan Guerrero DOB: 05/06/1976  REFERRING CLINICIAN:  HISTORY FROM: patient  REASON FOR VISIT: follow up   HISTORICAL  CHIEF COMPLAINT:  Chief Complaint  Patient presents with  . Migraine    rm 7, "daily HA, migraine on R side of head"  . Follow-up    8 month    HISTORY OF PRESENT ILLNESS:   UPDATE 10/01/15: Since last visit, was doing well and HA resolved on propranolol, so stopped it back in 2014. Then HA returned in 2016. Now back on propranolol x 2-3 months. Sumatriptan was too strong. Overall HA getting better. Naproxyn works well (5-10 x per month). Having ~ 5-10 days HA per month.  PRIOR HPI (01/09/13): 39 year old right-handed male with history of hypertension, here for evaluation of headaches and dizziness. Patient reports one to 2 year history of intermittent right-sided headaches, pounding throbbing sensation, radiating to the right eye. Headaches last up to 30 minutes at a time. He has hazy vision, flashing light sensation, photophobia, rocking back and forth sensation. No nausea vomiting or sensitivity to sound. He has up to 20 days of these headaches and symptoms per month. Patient has tried meclizine, tramadol, ibuprofen without relief. In June 2013 he was out of town near the [place], had severe headache and vertigo, had MRI of the brain which is unremarkable. Patient has not tried topiramate, propranolol, or any sumatriptan for the symptoms.  REVIEW OF SYSTEMS: Full 14 system review of systems performed and notable only for dizziness headache tremors light sens ear pain.    ALLERGIES: Allergies  Allergen Reactions  . Ibuprofen Other (See Comments)    reflux  . Ketoprofen Other (See Comments)    Causes muscle and stomach cramps    HOME MEDICATIONS: Outpatient Prescriptions Prior to Visit  Medication Sig Dispense Refill  . amLODipine (NORVASC) 10 MG tablet Take 10 mg by mouth daily.    . cycloSPORINE  (RESTASIS) 0.05 % ophthalmic emulsion Place 1 drop into both eyes 2 (two) times daily.    . fluticasone (FLONASE) 50 MCG/ACT nasal spray Place 2 sprays into the nose 2 (two) times daily.    . hydrochlorothiazide (HYDRODIURIL) 25 MG tablet Take 25 mg by mouth daily.    Marland Kitchen loratadine (CLARITIN) 10 MG tablet Take 10 mg by mouth daily.    . meclizine (ANTIVERT) 25 MG tablet Take 25 mg by mouth 3 (three) times daily as needed for dizziness or nausea.     . Multiple Vitamins-Minerals (MENS MULTIVITAMIN PLUS PO) Take 1 tablet by mouth daily.    . naproxen (NAPROSYN) 500 MG tablet Take 500 mg by mouth 2 (two) times daily as needed for mild pain.     Marland Kitchen omeprazole (PRILOSEC) 20 MG capsule Take 20 mg by mouth daily.    . propranolol ER (INDERAL LA) 60 MG 24 hr capsule take 1 capsule by mouth once daily 30 capsule 0  . SUMAtriptan (IMITREX) 100 MG tablet Take 1 tablet (100 mg total) by mouth once as needed for migraine. May repeat x 1 after 2 hours; maximum 2 tabs per day and 8 tabs per month 8 tablet 6  . traMADol (ULTRAM) 50 MG tablet Take 50 mg by mouth every 6 (six) hours as needed for pain.    . vitamin C (ASCORBIC ACID) 250 MG tablet Take 250 mg by mouth daily.    Marland Kitchen ibuprofen (ADVIL,MOTRIN) 800 MG tablet Take 800 mg by mouth every 8 (eight) hours  as needed for moderate pain.      No facility-administered medications prior to visit.    PAST MEDICAL HISTORY: Past Medical History  Diagnosis Date  . GERD (gastroesophageal reflux disease)   . Hypertension   . Allergy   . Spider bite   . Headache(784.0)   . Arthritis   . Abdominal pain   . Hernia   . Carpal tunnel syndrome   . Infection of the inner ear   . Sinus infection   . Hypokalemia     PAST SURGICAL HISTORY: Past Surgical History  Procedure Laterality Date  . Appendectomy  1984  . Hernia repair  01/30/2011    Right Inguinal Hernia repair with mesh  . Hernia repair  98/33/8250    Umbilical Hernia repair with meash    FAMILY  HISTORY: Family History  Problem Relation Age of Onset  . Scleroderma Mother   . Diabetes    . Hypertension      SOCIAL HISTORY:  Social History   Social History  . Marital Status: Single    Spouse Name: N/A  . Number of Children: 0  . Years of Education: college   Occupational History  . [employer]   Social History Main Topics  . Smoking status: Never Smoker   . Smokeless tobacco: Never Used  . Alcohol Use: No     Comment: quit 10/05/2009  . Drug Use: No  . Sexual Activity: Not on file   Other Topics Concern  . Not on file   Social History Narrative   Pt lives at home alone.   Caffeine Use- Does not use.     PHYSICAL EXAM  GENERAL EXAM/CONSTITUTIONAL: Vitals:  Filed Vitals:   10/01/15 1524  BP: 112/79  Pulse: 71  Height: 6' 3"  (1.905 m)  Weight: 266 lb (120.657 kg)     Body mass index is 33.25 kg/(m^2).  No exam data present  Patient is in no distress; well developed, nourished and groomed; neck is supple  CARDIOVASCULAR:  Examination of carotid arteries is normal; no carotid bruits  Regular rate and rhythm, no murmurs  Examination of peripheral vascular system by observation and palpation is normal  EYES:  Ophthalmoscopic exam of optic discs and posterior segments is normal; no papilledema or hemorrhages  MUSCULOSKELETAL:  Gait, strength, tone, movements noted in Neurologic exam below  NEUROLOGIC: MENTAL STATUS:  No flowsheet data found.  awake, alert, oriented to person, place and time  recent and remote memory intact  normal attention and concentration  language fluent, comprehension intact, naming intact,   fund of knowledge appropriate  CRANIAL NERVE:   2nd - no papilledema on fundoscopic exam  2nd, 3rd, 4th, 6th - pupils equal and reactive to light, visual fields full to confrontation, extraocular muscles intact, no nystagmus  5th - facial sensation symmetric  7th - facial strength  symmetric  8th - hearing intact  9th - palate elevates symmetrically, uvula midline  11th - shoulder shrug symmetric  12th - tongue protrusion midline  MOTOR:   normal bulk and tone, full strength in the BUE, BLE  SENSORY:   normal and symmetric to light touch, pinprick, temperature, vibration   COORDINATION:   finger-nose-finger, fine finger movements normal  REFLEXES:   deep tendon reflexes present and symmetric  GAIT/STATION:   narrow based gait; romberg is negative    DIAGNOSTIC DATA (LABS, IMAGING, TESTING) - I reviewed patient records, labs, notes, testing and imaging myself where available.  Lab  Results  Component Value Date   WBC 3.7* 10/21/2009   HGB 15.2 10/21/2009   HCT 45.7 10/21/2009   MCV 83.9 10/21/2009   PLT 232 10/21/2009      Component Value Date/Time   NA 138 01/21/2011 1010   K 3.5 01/21/2011 1010   CL 98 01/21/2011 1010   CO2 31 01/21/2011 1010   GLUCOSE 92 01/21/2011 1010   BUN 15 01/21/2011 1010   CREATININE 1.21 01/21/2011 1010   CALCIUM 9.4 01/21/2011 1010   PROT 7.4 10/21/2009 0935   ALBUMIN 4.1 10/21/2009 0935   AST 20 10/21/2009 0935   ALT 16 10/21/2009 0935   ALKPHOS 70 10/21/2009 0935   BILITOT 1.0 10/21/2009 0935   GFRNONAA >60 01/21/2011 1010   GFRAA  01/21/2011 1010    >60        The eGFR has been calculated using the MDRD equation. This calculation has not been validated in all clinical situations. eGFR's persistently <60 mL/min signify possible Chronic Kidney Disease.   No results found for: CHOL, HDL, LDLCALC, LDLDIRECT, TRIG, CHOLHDL No results found for: HGBA1C No results found for: VITAMINB12 No results found for: TSH   09/23/15 CT head  - No acute intracranial abnormalities.     ASSESSMENT AND PLAN  39 y.o. year old male here with here with intermittent headaches with migraine features since 2012. Neurologic exam unremarkable. Patient reports normal MRI of the brain in June 2013, although do  not have the report or images to review myself. CT head from 09/23/15 normal. HA improved with propranolol treatment. Sumatriptan not tolerated ("too strong").  PLAN: 1. Continue propranolol + naproxyn prn 2. Migraine education, triggers and treatment strategies reviewed 3. May consider sleep study in future (daytime fatigue, obesity, HTN)  Return in about 3 months (around 12/30/2015).    Penni Bombard, MD 08/65/7846, 9:62 PM Certified in Neurology, Neurophysiology and Neuroimaging  [facility] [address], [place], [place] [address] [phone]

## 2015-11-28 ENCOUNTER — Telehealth: Payer: Self-pay | Admitting: Diagnostic Neuroimaging

## 2015-11-28 MED ORDER — PROPRANOLOL HCL ER 60 MG PO CP24: 60.0000 mg | ORAL_CAPSULE | Freq: Every day | ORAL | Status: DC

## 2015-11-28 NOTE — Telephone Encounter (Signed)
Patient is calling to get a new Rx for propranolol ER (INDERAL LA) 60 MG 24 hr capsule called to [facility] on [address].

## 2016-01-03 ENCOUNTER — Ambulatory Visit (INDEPENDENT_AMBULATORY_CARE_PROVIDER_SITE_OTHER): Payer: BC Managed Care – PPO | Admitting: Diagnostic Neuroimaging

## 2016-01-03 ENCOUNTER — Encounter: Payer: Self-pay | Admitting: Diagnostic Neuroimaging

## 2016-01-03 VITALS — BP 130/93 | HR 89 | Ht 75.0 in | Wt 263.0 lb

## 2016-01-03 DIAGNOSIS — G4719 Other hypersomnia: Secondary | ICD-10-CM | POA: Diagnosis not present

## 2016-01-03 DIAGNOSIS — G43109 Migraine with aura, not intractable, without status migrainosus: Secondary | ICD-10-CM | POA: Diagnosis not present

## 2016-01-03 DIAGNOSIS — I10 Essential (primary) hypertension: Secondary | ICD-10-CM | POA: Diagnosis not present

## 2016-01-03 DIAGNOSIS — E669 Obesity, unspecified: Secondary | ICD-10-CM

## 2016-01-03 MED ORDER — PROPRANOLOL HCL ER 60 MG PO CP24: 60.0000 mg | ORAL_CAPSULE | Freq: Every day | ORAL | Status: DC

## 2016-01-03 NOTE — Patient Instructions (Signed)
Thank you for coming to see [place] at [facility]. I hope we have been able to provide you high quality care today.  You may receive a patient satisfaction survey over the next few weeks. We would appreciate your feedback and comments so that we may continue to improve ourselves and the health of our patients.  - continue propranolol - I will check sleep study    ~~~~~~~~~~~~~~~~~~~~~~~~~~~~~~~~~~~~~~~~~~~~~~~~~~~~~~~~~~~~~~~~~  DR. Breelyn Icard'S GUIDE TO HAPPY AND HEALTHY LIVING These are some of my general health and wellness recommendations. Some of them may apply to you better than others. Please use common sense as you try these suggestions and feel free to ask me any questions.   ACTIVITY/FITNESS Mental, social, emotional and physical stimulation are very important for brain and body health. Try learning a new activity (arts, music, language, sports, games).  Keep moving your body to the best of your abilities. You can do this at home, inside or outside, the park, community center, gym or anywhere you like. Consider a physical therapist or personal trainer to get started. Consider the app Sworkit. Fitness trackers such as smart-watches, smart-phones or Fitbits can help as well.   NUTRITION Eat more plants: colorful vegetables, nuts, seeds and berries.  Eat less sugar, salt, preservatives and processed foods.  Avoid toxins such as cigarettes and alcohol.  Drink water when you are thirsty. Warm water with a slice of lemon is an excellent morning drink to start the day.  Consider these websites for more information The Nutrition Source (https://www.henry-hernandez.biz/) Precision Nutrition (WindowBlog.ch)   RELAXATION Consider practicing mindfulness meditation or other relaxation techniques such as deep breathing, prayer, yoga, tai chi, massage. See website mindful.org or the apps Headspace or Calm to help get  started.   SLEEP Try to get at least 7-8+ hours sleep per day. Regular exercise and reduced caffeine will help you sleep better. Practice good sleep hygeine techniques. See website sleep.org for more information.   PLANNING Prepare estate planning, living will, healthcare POA documents. Sometimes this is best planned with the help of an attorney. Theconversationproject.org and agingwithdignity.org are excellent resources.

## 2016-01-03 NOTE — Progress Notes (Signed)
[facility]  PATIENT: Dylan Guerrero DOB: 1976-09-09  REFERRING CLINICIAN:  HISTORY FROM: patient  REASON FOR VISIT: follow up   HISTORICAL  CHIEF COMPLAINT:  Chief Complaint  Patient presents with  . Migraine    rm 6, having migraine maybe once every 2 wks, not as steady as before"  . Follow-up    3 month    HISTORY OF PRESENT ILLNESS:   UPDATE 01/03/16: Since last visit, HA improved. Now with 2 days HA per month. Now on flonase, claritin for allergies and sinus issues, and he thinks this is also helping HA. Tolerating other meds. Still with daytime sleepiness.   UPDATE 10/01/15: Since last visit, was doing well and HA resolved on propranolol, so stopped it back in 2014. Then HA returned in 2016. Now back on propranolol x 2-3 months. Sumatriptan was too strong. Overall HA getting better. Naproxyn works well (5-10 x per month). Having ~ 5-10 days HA per month.  PRIOR HPI (01/09/13): 40 year old right-handed male with history of hypertension, here for evaluation of headaches and dizziness. Patient reports one to 2 year history of intermittent right-sided headaches, pounding throbbing sensation, radiating to the right eye. Headaches last up to 30 minutes at a time. He has hazy vision, flashing light sensation, photophobia, rocking back and forth sensation. No nausea vomiting or sensitivity to sound. He has up to 20 days of these headaches and symptoms per month. Patient has tried meclizine, tramadol, ibuprofen without relief. In June 2013 he was out of town near the [place], had severe headache and vertigo, had MRI of the brain which is unremarkable. Patient has not tried topiramate, propranolol, or any sumatriptan for the symptoms.  REVIEW OF SYSTEMS: Full 14 system review of systems performed and negative except for headache light sens ear pain.    ALLERGIES: Allergies  Allergen Reactions  . Ibuprofen Other (See Comments)    reflux  . Ketoprofen  Other (See Comments)    Causes muscle and stomach cramps    HOME MEDICATIONS: Outpatient Prescriptions Prior to Visit  Medication Sig Dispense Refill  . amLODipine (NORVASC) 10 MG tablet Take 10 mg by mouth daily.    . cycloSPORINE (RESTASIS) 0.05 % ophthalmic emulsion Place 1 drop into both eyes 2 (two) times daily.    . fluticasone (FLONASE) 50 MCG/ACT nasal spray Place 2 sprays into the nose 2 (two) times daily.    . hydrochlorothiazide (HYDRODIURIL) 25 MG tablet Take 25 mg by mouth daily.    Marland Kitchen loratadine (CLARITIN) 10 MG tablet Take 10 mg by mouth daily.    . meclizine (ANTIVERT) 25 MG tablet Take 25 mg by mouth 3 (three) times daily as needed for dizziness or nausea.     . Multiple Vitamins-Minerals (MENS MULTIVITAMIN PLUS PO) Take 1 tablet by mouth daily.    . naproxen (NAPROSYN) 500 MG tablet Take 500 mg by mouth 2 (two) times daily as needed for mild pain.     Marland Kitchen omeprazole (PRILOSEC) 20 MG capsule Take 20 mg by mouth daily.    . propranolol ER (INDERAL LA) 60 MG 24 hr capsule Take 1 capsule (60 mg total) by mouth daily. 90 capsule 4  . vitamin C (ASCORBIC ACID) 250 MG tablet Take 250 mg by mouth daily.    . traMADol (ULTRAM) 50 MG tablet Take 50 mg by mouth every 6 (six) hours as needed for pain.     No facility-administered medications prior to visit.    PAST  MEDICAL HISTORY: Past Medical History  Diagnosis Date  . GERD (gastroesophageal reflux disease)   . Hypertension   . Allergy   . Spider bite   . Headache(784.0)   . Arthritis   . Abdominal pain   . Hernia   . Carpal tunnel syndrome   . Infection of the inner ear   . Sinus infection   . Hypokalemia     PAST SURGICAL HISTORY: Past Surgical History  Procedure Laterality Date  . Appendectomy  1984  . Hernia repair  01/30/2011    Right Inguinal Hernia repair with mesh  . Hernia repair  01/30/2011    Umbilical Hernia repair with meash    FAMILY HISTORY: Family History  Problem Relation Age of Onset  .  Scleroderma Mother   . Diabetes    . Hypertension      SOCIAL HISTORY:  Social History   Social History  . Marital Status: Single    Spouse Name: N/A  . Number of Children: 0  . 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 Smoking status: Never Smoker   . Smokeless tobacco: Never Used  . Alcohol Use: No     Comment: quit 10/05/2009  . Drug Use: No  . Sexual Activity: Not on file   Other Topics Concern  . Not on file   Social History Narrative   Pt lives at home alone.   Caffeine Use- Does not use.     PHYSICAL EXAM  GENERAL EXAM/CONSTITUTIONAL: Vitals:  Filed Vitals:   01/03/16 0943  BP: 130/93  Pulse: 89  Height: 6' 3" (1.905 m)  Weight: 263 lb (119.296 kg)   Body mass index is 32.87 kg/(m^2). No exam data present  Patient is in no distress; well developed, nourished and groomed; neck is supple  CARDIOVASCULAR:  Examination of carotid arteries is normal; no carotid bruits  Regular rate and rhythm, no murmurs  Examination of peripheral vascular system by observation and palpation is normal  EYES:  Ophthalmoscopic exam of optic discs and posterior segments is normal; no papilledema or hemorrhages  MUSCULOSKELETAL:  Gait, strength, tone, movements noted in Neurologic exam below  NEUROLOGIC: MENTAL STATUS:  No flowsheet data found.  awake, alert, oriented to person, place and time  recent and remote memory intact  normal attention and concentration  language fluent, comprehension intact, naming intact,   fund of knowledge appropriate  CRANIAL NERVE:   2nd - no papilledema on fundoscopic exam  2nd, 3rd, 4th, 6th - pupils equal and reactive to light, visual fields full to confrontation, extraocular muscles intact, no nystagmus  5th - facial sensation symmetric  7th - facial strength symmetric  8th - hearing intact  9th - palate elevates symmetrically,  uvula midline  11th - shoulder shrug symmetric  12th - tongue protrusion midline  MOTOR:   normal bulk and tone, full strength in the BUE, BLE  SENSORY:   normal and symmetric to light touch, temperature, vibration   COORDINATION:   finger-nose-finger, fine finger movements normal  REFLEXES:   deep tendon reflexes TRACE and symmetric  GAIT/STATION:   narrow based gait; TANDEM STABLE     DIAGNOSTIC DATA (LABS, IMAGING, TESTING) - I reviewed patient records, labs, notes, testing and imaging myself where available.  Lab Results  Component Value Date   WBC 3.7* 10/21/2009   HGB 15.2 10/21/2009   HCT 45.7 10/21/2009   MCV 83.9   10/21/2009   PLT 232 10/21/2009      Component Value Date/Time   NA 138 01/21/2011 1010   K 3.5 01/21/2011 1010   CL 98 01/21/2011 1010   CO2 31 01/21/2011 1010   GLUCOSE 92 01/21/2011 1010   BUN 15 01/21/2011 1010   CREATININE 1.21 01/21/2011 1010   CALCIUM 9.4 01/21/2011 1010   PROT 7.4 10/21/2009 0935   ALBUMIN 4.1 10/21/2009 0935   AST 20 10/21/2009 0935   ALT 16 10/21/2009 0935   ALKPHOS 70 10/21/2009 0935   BILITOT 1.0 10/21/2009 0935   GFRNONAA >60 01/21/2011 1010   GFRAA  01/21/2011 1010    >60        The eGFR has been calculated using the MDRD equation. This calculation has not been validated in all clinical situations. eGFR's persistently <60 mL/min signify possible Chronic Kidney Disease.   No results found for: CHOL, HDL, LDLCALC, LDLDIRECT, TRIG, CHOLHDL No results found for: HGBA1C No results found for: VITAMINB12 No results found for: TSH   09/16/15 EKG [I reviewed images myself and agree with interpretation. -VRP]  - normal sinus rhythm  09/23/15 CT head  - No acute intracranial abnormalities.     ASSESSMENT AND PLAN  40 y.o. year old male here with here with intermittent headaches with migraine features since 2012. Neurologic exam unremarkable. Patient reports normal MRI of the brain in June 2013,  although do not have the report or images to review myself. CT head from 09/23/15 normal. HA improved with propranolol treatment. Sumatriptan not tolerated ("too strong").  Dx:  Migraine with aura and without status migrainosus, not intractable  Excessive daytime sleepiness  Accelerated hypertension  Obesity    PLAN: 1. Continue propranolol + naproxyn prn 2. Migraine education, triggers and treatment strategies reviewed 3. Check sleep study (daytime fatigue, obesity, HTN)  Orders Placed This Encounter  Procedures  . Ambulatory referral to Sleep Studies   Meds ordered this encounter  Medications  . propranolol ER (INDERAL LA) 60 MG 24 hr capsule    Sig: Take 1 capsule (60 mg total) by mouth daily.    Dispense:  90 capsule    Refill:  4   Return in about 3 months (around 04/03/2016).    Penni Bombard, MD 8/85/0277, 41:28 AM Certified in Neurology, Neurophysiology and Neuroimaging  [facility] [address] [place], [place] [address] [phone]

## 2016-01-09 ENCOUNTER — Institutional Professional Consult (permissible substitution): Payer: BC Managed Care – PPO | Admitting: Neurology

## 2016-02-13 ENCOUNTER — Institutional Professional Consult (permissible substitution): Payer: BC Managed Care – PPO | Admitting: Neurology

## 2016-03-17 ENCOUNTER — Ambulatory Visit (INDEPENDENT_AMBULATORY_CARE_PROVIDER_SITE_OTHER): Payer: BC Managed Care – PPO | Admitting: Neurology

## 2016-03-17 ENCOUNTER — Encounter: Payer: Self-pay | Admitting: Neurology

## 2016-03-17 VITALS — BP 116/82 | HR 90 | Resp 18 | Ht 75.0 in | Wt 262.0 lb

## 2016-03-17 DIAGNOSIS — R519 Headache, unspecified: Secondary | ICD-10-CM

## 2016-03-17 DIAGNOSIS — R51 Headache: Secondary | ICD-10-CM

## 2016-03-17 DIAGNOSIS — E669 Obesity, unspecified: Secondary | ICD-10-CM

## 2016-03-17 DIAGNOSIS — R0902 Hypoxemia: Secondary | ICD-10-CM | POA: Diagnosis not present

## 2016-03-17 DIAGNOSIS — R351 Nocturia: Secondary | ICD-10-CM | POA: Diagnosis not present

## 2016-03-17 DIAGNOSIS — G4734 Idiopathic sleep related nonobstructive alveolar hypoventilation: Secondary | ICD-10-CM

## 2016-03-17 DIAGNOSIS — G478 Other sleep disorders: Secondary | ICD-10-CM

## 2016-03-17 NOTE — Patient Instructions (Signed)

## 2016-03-17 NOTE — Progress Notes (Signed)
Subjective:    Patient ID: Dylan Guerrero is a 40 y.o. male.  HPI     Huston Foley, MD, PhD [facility] [address] [address] [place], [place] [address]  Dear Dylan Guerrero,   I saw your patient, Dylan Guerrero, upon your kind request in my clinic today for initial consultation of his sleep disturbance, in particular, concern for underlying obstructive sleep apnea. The patient is unaccompanied today. As you know, Dylan Guerrero is a 39 year old right-handed gentleman with an underlying medical history of recurrent headaches, hypertension, allergies, arthritis, history of sinus infection, reflux disease, and obesity, who reports possible snoring and an abnormal overnight pulse oximetry test recently. I reviewed your office note from 01/03/2016.  He denies morning headaches, has nocturia about once per night on average, does not always wake up rested, feels either adequately to marginally rested typically. Tries to go to bed by 8:30 and puts his TV on the 90 minute timer, typically is asleep about 30-45 minutes into it. Wakeup time is 4:30 AM. His weight has been fluctuating. Epworth sleepiness score is 1 out of 24 today, his fatigue score is 21 out of 63. Primary care physician ordered an overnight pulse oximetry test recently in April 2017. Results are not available for my review today but patient verbally reports that findings were mildly abnormal and PCP talked to him about potentially doing a sleep study, and the diagnosis of OSA and treatment options including CPAP at the time.  The patient is single and works as a Midwife for [employer]. He is a nonsmoker. He lives alone. He does not drink caffeine on a regular basis (maybe one cup per week) or alcohol on a regular basis, in fact quit alcohol in 2011, never heavy drinker. Has no pets, no children, no FHx of OSA. No RLS Sx.  His Past Medical History Is Significant For: Past Medical History  Diagnosis  Date  . GERD (gastroesophageal reflux disease)   . Hypertension   . Allergy   . Spider bite   . Headache(784.0)   . Arthritis   . Abdominal pain   . Hernia   . Carpal tunnel syndrome   . Infection of the inner ear   . Sinus infection   . Hypokalemia     Her Past Surgical History Is Significant For: Past Surgical History  Procedure Laterality Date  . Appendectomy  1984  . Hernia repair  01/30/2011    Right Inguinal Hernia repair with mesh  . Hernia repair  01/30/2011    Umbilical Hernia repair with meash    His Family History Is Significant For: Family History  Problem Relation Age of Onset  . Scleroderma Mother   . Diabetes    . Hypertension      His Social History Is Significant For: Social History   Social History  . Marital Status: Single    Spouse Name: N/A  . Number of Children: 0  . Years of Education: college   Occupational History  . BUS DRIVER [employer]   Social History Main Topics  . Smoking status: Never Smoker   . Smokeless tobacco: Never Used  . Alcohol Use: No     Comment: quit 10/05/2009  . Drug Use: No  . Sexual Activity: Not Asked   Other Topics Concern  . None   Social History Narrative   Pt lives at home alone.   Caffeine Use- maybe 1 cup of coffee a week  His Allergies Are:  Allergies  Allergen Reactions  . Ibuprofen Other (See Comments)    reflux  . Ketoprofen Other (See Comments)    Causes muscle and stomach cramps  :   His Current Medications Are:  Outpatient Encounter Prescriptions as of 03/17/2016  Medication Sig  . amLODipine (NORVASC) 10 MG tablet Take 10 mg by mouth daily.  . cycloSPORINE (RESTASIS) 0.05 % ophthalmic emulsion Place 1 drop into both eyes 2 (two) times daily.  . fluticasone (FLONASE) 50 MCG/ACT nasal spray Place 2 sprays into the nose 2 (two) times daily.  . hydrochlorothiazide (HYDRODIURIL) 25 MG tablet Take 25 mg by mouth daily.  Marland Kitchen levocetirizine (XYZAL) 5 MG tablet Take 5 mg by  mouth every evening.  . meclizine (ANTIVERT) 25 MG tablet Take 25 mg by mouth 3 (three) times daily as needed for dizziness or nausea.   . Multiple Vitamins-Minerals (MENS MULTIVITAMIN PLUS PO) Take 1 tablet by mouth daily.  . naproxen (NAPROSYN) 500 MG tablet Take 500 mg by mouth 2 (two) times daily as needed for mild pain.   Marland Kitchen omeprazole (PRILOSEC) 20 MG capsule Take 20 mg by mouth daily.  . propranolol ER (INDERAL LA) 60 MG 24 hr capsule Take 1 capsule (60 mg total) by mouth daily.  . vitamin C (ASCORBIC ACID) 250 MG tablet Take 250 mg by mouth daily.  . [DISCONTINUED] loratadine (CLARITIN) 10 MG tablet Take 10 mg by mouth daily.   No facility-administered encounter medications on file as of 03/17/2016.  :  Review of Systems:  Out of a complete 14 point review of systems, all are reviewed and negative with the exception of these symptoms as listed below:   Review of Systems  Neurological:       No trouble falling or staying asleep, headaches, unable to take naps during the day  Epworth Sleepiness Scale 0= would never doze 1= slight chance of dozing 2= moderate chance of dozing 3= high chance of dozing  Sitting and reading:0 Watching TV:0 Sitting inactive in a public place (ex. Theater or meeting):0 As a passenger in a car for an hour without a break:0 Lying down to rest in the afternoon:1 Sitting and talking to someone:0 Sitting quietly after lunch (no alcohol):0 In a car, while stopped in traffic:0 Total:1   Objective:  Neurologic Exam  Physical Exam Physical Examination:   Filed Vitals:   03/17/16 1058  BP: 116/82  Pulse: 90  Resp: 18   General Examination: The patient is a very pleasant 39 y.o. male in no acute distress. He appears well-developed and well-nourished and well groomed.   HEENT: Normocephalic, atraumatic, pupils are equal, round and reactive to light and accommodation. Funduscopic exam is normal with sharp disc margins noted. Extraocular tracking is  good without limitation to gaze excursion or nystagmus noted. Normal smooth pursuit is noted. Hearing is grossly intact. Tympanic membranes are clear bilaterally. Face is symmetric with normal facial animation and normal facial sensation. Speech is clear with no dysarthria noted. There is no hypophonia. There is no lip, neck/head, jaw or voice tremor. Neck is supple with full range of passive and active motion. There are no carotid bruits on auscultation. Oropharynx exam reveals: mild mouth dryness, adequate to marginal dental hygiene and moderate airway crowding, due to larger tongue, large uvula, which is also fairly elongated, tonsils in place, left a little larger than right. Mallampati is class II. Tongue protrudes centrally and palate elevates symmetrically. Tonsils are 1-2+ bilaterally. Neck circumference is 17-5/8  inches. No overbite, maybe mild underbite.   Chest: Clear to auscultation without wheezing, rhonchi or crackles noted.  Heart: S1+S2+0, regular and normal without murmurs, rubs or gallops noted.   Abdomen: Soft, non-tender and non-distended with normal bowel sounds appreciated on auscultation.  Extremities: There is no pitting edema in the distal lower extremities bilaterally. Pedal pulses are intact.  Skin: Warm and dry without trophic changes noted. There are no varicose veins.  Musculoskeletal: exam reveals no obvious joint deformities, tenderness or joint swelling or erythema.   Neurologically:  Mental status: The patient is awake, alert and oriented in all 4 spheres. His immediate and remote memory, attention, language skills and fund of knowledge are appropriate. There is no evidence of aphasia, agnosia, apraxia or anomia. Speech is clear with normal prosody and enunciation. Thought process is linear. Mood is normal and affect is normal.  Cranial nerves II - XII are as described above under HEENT exam. In addition: shoulder shrug is normal with equal shoulder height  noted. Motor exam: Normal bulk, strength and tone is noted. There is no drift, tremor or rebound. Romberg is negative. Reflexes are 2+ throughout. Babinski: Toes are flexor bilaterally. Fine motor skills and coordination: intact with normal finger taps, normal hand movements, normal rapid alternating patting, normal foot taps and normal foot agility.  Cerebellar testing: No dysmetria or intention tremor on finger to nose testing. Heel to shin is unremarkable bilaterally. There is no truncal or gait ataxia.  Sensory exam: intact to light touch, pinprick, vibration, temperature sense in the upper and lower extremities.  Gait, station and balance: He stands easily. No veering to one side is noted. No leaning to one side is noted. Posture is age-appropriate and stance is narrow based. Gait shows normal stride length and normal pace. No problems turning are noted. He turns en bloc. Tandem walk is unremarkable.   Assessment and Plan:  In summary, WALKER SITAR is a very pleasant 40 y.o.-year old male with an underlying medical history of recurrent headaches, hypertension, allergies, arthritis, history of sinus infection, reflux disease, and obesity, who reports possible snoring and an abnormal overnight pulse oximetry test recently. His history and physical exam are concerning for obstructive sleep apnea (OSA). I had a long chat with the patient about my findings and the diagnosis of OSA, its prognosis and treatment options. We talked about medical treatments, surgical interventions and non-pharmacological approaches. I explained in particular the risks and ramifications of untreated moderate to severe OSA, especially with respect to developing cardiovascular disease down the Road, including congestive heart failure, difficult to treat hypertension, cardiac arrhythmias, or stroke. Even type 2 diabetes has, in part, been linked to untreated OSA. Symptoms of untreated OSA include daytime sleepiness, memory  problems, mood irritability and mood disorder such as depression and anxiety, lack of energy, as well as recurrent headaches, especially morning headaches. We talked about trying to maintain a healthy lifestyle in general, as well as the importance of weight control. I encouraged the patient to eat healthy, exercise daily and keep well hydrated, to keep a scheduled bedtime and wake time routine, to not skip any meals and eat healthy snacks in between meals. I advised the patient not to drive when feeling sleepy. I recommended the following at this time: sleep study with potential positive airway pressure titration. (We will score hypopneas at 3% and split the sleep study into diagnostic and treatment portion, if the estimated. 2 hour AHI is >15/h).   I explained the sleep  test procedure to the patient and also outlined possible surgical and non-surgical treatment options of OSA, including the use of a custom-made dental device (which would require a referral to a specialist dentist or oral surgeon), upper airway surgical options, such as pillar implants, radiofrequency surgery, tongue base surgery, and UPPP (which would involve a referral to an ENT surgeon). Rarely, jaw surgery such as mandibular advancement may be considered.  I also explained the CPAP treatment option to the patient, who indicated that he would be willing to try CPAP if the need arises. I explained the importance of being compliant with PAP treatment, not only for insurance purposes but primarily to improve His symptoms, and for the patient's long term health benefit, including to reduce His cardiovascular risks. I answered all his questions today and the patient was in agreement. I would like to see him back after the sleep study is completed and encouraged him to call with any interim questions, concerns, problems or updates.   Thank you very much for allowing me to participate in the care of this nice patient. If I can be of any further  assistance to you please do not hesitate to talk to me.  Sincerely,   Huston FoleySaima Vonda Harth, MD, PhD

## 2016-04-15 ENCOUNTER — Ambulatory Visit: Payer: BC Managed Care – PPO | Admitting: Diagnostic Neuroimaging

## 2016-04-23 ENCOUNTER — Ambulatory Visit (INDEPENDENT_AMBULATORY_CARE_PROVIDER_SITE_OTHER): Payer: BC Managed Care – PPO | Admitting: Neurology

## 2016-04-23 DIAGNOSIS — G479 Sleep disorder, unspecified: Secondary | ICD-10-CM

## 2016-04-23 DIAGNOSIS — G4733 Obstructive sleep apnea (adult) (pediatric): Secondary | ICD-10-CM | POA: Diagnosis not present

## 2016-04-23 DIAGNOSIS — G4734 Idiopathic sleep related nonobstructive alveolar hypoventilation: Secondary | ICD-10-CM

## 2016-04-23 DIAGNOSIS — G4720 Circadian rhythm sleep disorder, unspecified type: Secondary | ICD-10-CM

## 2016-04-29 ENCOUNTER — Telehealth: Payer: Self-pay | Admitting: Neurology

## 2016-04-29 DIAGNOSIS — G4733 Obstructive sleep apnea (adult) (pediatric): Secondary | ICD-10-CM

## 2016-04-29 NOTE — Telephone Encounter (Signed)
Patient referred by Dr. Marjory Lies, seen by me on 03/17/16, diagnostic PSG on 04/23/16.   Please call and notify the patient that the recent sleep study did confirm the diagnosis of severe obstructive sleep apnea and that I recommend treatment for this in the form of CPAP. This will require a repeat sleep study for proper titration and mask fitting. Please explain to patient and arrange for a CPAP titration study. I have placed an order in the chart. Thanks, and please route to [facility] for scheduling next sleep study.  Huston Foley, MD, PhD [facility])

## 2016-04-29 NOTE — Telephone Encounter (Signed)
I spoke to patient and he is aware of results and recommendations. He is willing to proceed with titration study. I will send copy to PCP.  

## 2016-05-19 ENCOUNTER — Ambulatory Visit (INDEPENDENT_AMBULATORY_CARE_PROVIDER_SITE_OTHER): Payer: BC Managed Care – PPO | Admitting: Neurology

## 2016-05-19 DIAGNOSIS — G4733 Obstructive sleep apnea (adult) (pediatric): Secondary | ICD-10-CM | POA: Diagnosis not present

## 2016-05-19 DIAGNOSIS — G479 Sleep disorder, unspecified: Secondary | ICD-10-CM

## 2016-05-19 DIAGNOSIS — G4720 Circadian rhythm sleep disorder, unspecified type: Secondary | ICD-10-CM

## 2016-05-26 ENCOUNTER — Telehealth: Payer: Self-pay | Admitting: Neurology

## 2016-05-26 DIAGNOSIS — G4733 Obstructive sleep apnea (adult) (pediatric): Secondary | ICD-10-CM

## 2016-05-26 NOTE — Telephone Encounter (Signed)
Pt returned call. Please call when you have time.

## 2016-05-26 NOTE — Telephone Encounter (Signed)
I spoke to patient and he is aware of results and recommendations. He is willing to proceed with treatment. I will send orders to [facility]. Patient requested specifically not to use [facility]. I have sent report to PCP. I will send the patient a letter reminding him to make f/u appt and stress the importance of compliance.

## 2016-05-26 NOTE — Telephone Encounter (Signed)
Patient referred by Dr. Marjory LiesPenumalli, seen by me on 03/17/16, diagnostic PSG on 04/23/16, CPAP study on 05/19/16.   Please call and inform patient that I have entered an order for treatment with positive airway pressure (PAP) treatment of obstructive sleep apnea (OSA). He did well during the latest sleep study with CPAP. We will, therefore, arrange for a machine for home use through a DME (durable medical equipment) company of His choice; and I will see the patient back in follow-up in about 8-10 weeks. Please also explain to the patient that I will be looking out for compliance data, which can be downloaded from the machine (stored on an SD card, that is inserted in the machine) or via remote access through a modem, that is built into the machine. At the time of the followup appointment we will discuss sleep study results and how it is going with PAP treatment at home. Please advise patient to bring His machine at the time of the first FU visit, even though this is cumbersome. Bringing the machine for every visit after that will likely not be needed, but often helps for the first visit to troubleshoot if needed. Please re-enforce the importance of compliance with treatment and the need for us to monitor compliance data - often an insurance requirement and actually good feedback for the patient as far as how they are doing.  Also remind patient, that any interim PAP machine or mask issues should be first addressed with the DME company, as they can often help better with technical and mask fit issues. Please ask if patient has a preference regarding DME company.  Please also make sure, the patient has a follow-up appointment with me in about 8-10 weeks from the setup date, thanks.  Once you have spoken to the patient - and faxed/routed report to PCP and referring MD (if other than PCP), you can close this encounter, thanks,   Huston FoleySaima Mikah Rottinghaus, MD, PhD [facility] ([facility])

## 2016-05-26 NOTE — Telephone Encounter (Signed)
I called patient back. He was just calling because we got disconnected today. I made sure he had all the information that he needed.

## 2016-05-26 NOTE — Telephone Encounter (Signed)
LM for patient to call back for results

## 2016-05-26 NOTE — Telephone Encounter (Signed)
Pt called back and wants you to call him back dg

## 2016-06-24 ENCOUNTER — Other Ambulatory Visit: Payer: Self-pay | Admitting: Internal Medicine

## 2016-06-24 ENCOUNTER — Ambulatory Visit
Admission: RE | Admit: 2016-06-24 | Discharge: 2016-06-24 | Disposition: A | Discharge status: Home / Self Care | Payer: BC Managed Care – PPO | Source: Ambulatory Visit | Attending: Internal Medicine | Admitting: Internal Medicine

## 2016-06-24 DIAGNOSIS — M25551 Pain in right hip: Secondary | ICD-10-CM
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
## 2016-12-17 ENCOUNTER — Encounter (HOSPITAL_COMMUNITY): Payer: Self-pay | Admitting: *Deleted

## 2016-12-17 ENCOUNTER — Emergency Department (HOSPITAL_COMMUNITY): Payer: BC Managed Care – PPO

## 2016-12-17 ENCOUNTER — Emergency Department (HOSPITAL_COMMUNITY)
Admission: EM | Admit: 2016-12-17 | Discharge: 2016-12-17 | Disposition: A | Discharge status: Home / Self Care | Payer: BC Managed Care – PPO | Attending: Physician Assistant | Admitting: Physician Assistant

## 2016-12-17 DIAGNOSIS — Z79899 Other long term (current) drug therapy: Secondary | ICD-10-CM | POA: Insufficient documentation

## 2016-12-17 DIAGNOSIS — H6123 Impacted cerumen, bilateral: Secondary | ICD-10-CM | POA: Diagnosis not present

## 2016-12-17 DIAGNOSIS — I10 Essential (primary) hypertension: Secondary | ICD-10-CM | POA: Diagnosis not present

## 2016-12-17 DIAGNOSIS — H9203 Otalgia, bilateral: Secondary | ICD-10-CM | POA: Diagnosis present

## 2016-12-17 MED ORDER — DOCUSATE SODIUM 50 MG/5ML PO LIQD
50.0000 mg | Freq: Once | ORAL | Status: AC
  Administered 2016-12-17: 50 mg via ORAL
  Filled 2016-12-17: qty 10

## 2016-12-17 MED ORDER — HYDROCODONE-ACETAMINOPHEN 5-325 MG PO TABS
2.0000 | ORAL_TABLET | Freq: Once | ORAL | Status: AC
Start: 2016-12-17 — End: 2016-12-17
  Administered 2016-12-17: 2 via ORAL
  Filled 2016-12-17: qty 2

## 2016-12-17 MED ORDER — AMOXICILLIN 500 MG PO CAPS: 500.0000 mg | ORAL_CAPSULE | Freq: Three times a day (TID) | ORAL | 0 refills | Status: AC

## 2016-12-17 NOTE — ED Notes (Signed)
Patient transported to X-ray 

## 2016-12-17 NOTE — ED Provider Notes (Signed)
[facility]-EMERGENCY DEPT Provider Note   CSN: [id number] Arrival date & time: 12/17/16  1017  History   Chief Complaint Chief Complaint  Patient presents with  . Shortness of Breath    HPI Dylan Guerrero is a 41 y.o. male with a h/o of migraine, HTN, and allergies who presents to the Emergency Department with dyspnea. He states he started getting SOB with some chest tightness and a left-sided headache that radiates down the left side of his neck and onto the left side of his anterior chest wall yesterday, but the symptoms worsened this morning. He states the current headache feels different than his usual migraines. He also presents with nasal congestion, sore through, bilateral otalgia, and tinnitus.  Denies hearing changes, eye pain, visual changes, rhinorrhea, lightheadedness, syncope, CP, or abdominal pain.  He is a non-smoker. PMH includes migraines, allergies, and HTN. Medication sensitivities include ibuprofen, which causes gastric upset.  HPI  Past Medical History:  Diagnosis Date  . Abdominal pain   . Allergy   . Arthritis   . Carpal tunnel syndrome   . GERD (gastroesophageal reflux disease)   . Headache(784.0)   . Hernia   . Hypertension   . Hypokalemia   . Infection of the inner ear   . Sinus infection   . Spider bite    Patient Active Problem List   Diagnosis Date Noted  . Migraine with aura 01/09/2013  . Umbilical pain 10/22/2011   Past Surgical History:  Procedure Laterality Date  . APPENDECTOMY  1984  . HERNIA REPAIR  01/30/2011   Right Inguinal Hernia repair with mesh  . HERNIA REPAIR  01/30/2011   Umbilical Hernia repair with meash     Home Medications    Prior to Admission medications   Medication Sig Start Date End Date Taking? Authorizing Provider  amLODipine (NORVASC) 10 MG tablet Take 10 mg by mouth daily.    Historical Provider, MD  cycloSPORINE (RESTASIS) 0.05 % ophthalmic emulsion Place 1 drop into both eyes 2 (two) times daily.    Historical  Provider, MD  fluticasone (FLONASE) 50 MCG/ACT nasal spray Place 2 sprays into the nose 2 (two) times daily.    Historical Provider, MD  hydrochlorothiazide (HYDRODIURIL) 25 MG tablet Take 25 mg by mouth daily.    Historical Provider, MD  levocetirizine (XYZAL) 5 MG tablet Take 5 mg by mouth every evening.    Historical Provider, MD  meclizine (ANTIVERT) 25 MG tablet Take 25 mg by mouth 3 (three) times daily as needed for dizziness or nausea.     Historical Provider, MD  Multiple Vitamins-Minerals (MENS MULTIVITAMIN PLUS PO) Take 1 tablet by mouth daily.    Historical Provider, MD  naproxen (NAPROSYN) 500 MG tablet Take 500 mg by mouth 2 (two) times daily as needed for mild pain.     Historical Provider, MD  omeprazole (PRILOSEC) 20 MG capsule Take 20 mg by mouth daily.    Historical Provider, MD  propranolol ER (INDERAL LA) 60 MG 24 hr capsule Take 1 capsule (60 mg total) by mouth daily. 01/03/16   Suanne Marker, MD  vitamin C (ASCORBIC ACID) 250 MG tablet Take 250 mg by mouth daily.    Historical Provider, MD    Family History Family History  Problem Relation Age of Onset  . Scleroderma Mother   . Diabetes    . Hypertension      Social History Social History  Substance Use Topics  . Smoking status: Never Smoker  .  Smokeless tobacco: Never Used  . Alcohol use No     Comment: quit 10/05/2009    Allergies   Ibuprofen and Ketoprofen  Review of Systems Review of Systems  Constitutional: Negative for activity change, chills and fever.  HENT: Positive for congestion, ear pain, sinus pain, sinus pressure, sore throat and tinnitus. Negative for facial swelling, hearing loss and rhinorrhea.   Eyes: Negative for pain and visual disturbance.  Respiratory: Positive for shortness of breath. Negative for cough.   Cardiovascular: Negative for chest pain and leg swelling.  Gastrointestinal: Negative for abdominal pain.  Genitourinary: Negative for flank pain.  Musculoskeletal: Positive  for neck pain. Negative for back pain and neck stiffness.  Allergic/Immunologic: Positive for environmental allergies.  Neurological: Positive for dizziness and headaches. Negative for weakness.  Psychiatric/Behavioral: Negative for confusion.  All other systems reviewed and are negative.    Physical Exam Updated Vital Signs BP 136/96   Pulse 87   Temp 98 F (36.7 C) (Oral)   Resp 17   Ht 6\' 2"  (1.88 m)   Wt 120.2 kg   SpO2 99%   BMI 34.02 kg/m   Physical Exam  Constitutional: He is oriented to person, place, and time. He appears well-developed and well-nourished.  HENT:  Head: Normocephalic and atraumatic.  Right Ear: Hearing and external ear normal.  Left Ear: Hearing and external ear normal.  Nose: Mucosal edema present. No rhinorrhea. Right sinus exhibits no maxillary sinus tenderness and no frontal sinus tenderness. Left sinus exhibits no maxillary sinus tenderness and no frontal sinus tenderness.  Mouth/Throat: Oropharynx is clear and moist and mucous membranes are normal.  TMs are occluded bilaterally. After treatment with colace, TMs are not injected or bulging. Non-erythematous.  No drainage.   Eyes: Conjunctivae and EOM are normal. Pupils are equal, round, and reactive to light.  Neck: Normal range of motion. Neck supple. No tracheal deviation present. No thyromegaly present.  Mild tenderness over the sternocleidomastoid.   Cardiovascular: Normal rate, regular rhythm, normal heart sounds and intact distal pulses.  Exam reveals no gallop and no friction rub.   No murmur heard. Pulmonary/Chest: Effort normal and breath sounds normal. No respiratory distress. He has no wheezes. He has no rales. He exhibits no tenderness.  Abdominal: Soft. Bowel sounds are normal. He exhibits no distension.  Musculoskeletal: Normal range of motion. He exhibits no edema.  Lymphadenopathy:    He has no cervical adenopathy.  Neurological: He is alert and oriented to person, place, and time.  He has normal strength. No cranial nerve deficit or sensory deficit. Coordination and gait normal. GCS eye subscore is 4. GCS verbal subscore is 5. GCS motor subscore is 6.  Skin: Skin is warm and dry.  Psychiatric: He has a normal mood and affect. His behavior is normal. Judgment and thought content normal.  Nursing note and vitals reviewed.  ED Treatments / Results  Labs (all labs ordered are listed, but only abnormal results are displayed) Labs Reviewed - No data to display  EKG  EKG Interpretation  Date/Time:  Thursday December 17 2016 10:18:19 EDT Ventricular Rate:  84 PR Interval:    QRS Duration: 92 QT Interval:  373 QTC Calculation: 441 R Axis:   43 Text Interpretation:  Sinus rhythm ST elev, probable normal early repol pattern No significant change since last tracing Confirmed by Kandis Mannan ([id number]) on 12/17/2016 10:28:32 AM      Radiology Dg Chest 2 View  Result Date: 12/17/2016 CLINICAL DATA:  Shortness of  breath.  Hypertension. EXAM: CHEST  2 VIEW COMPARISON:  January 21, 2011 FINDINGS: Lungs are clear. Heart size and pulmonary vascularity are normal. No adenopathy. No bone lesions. IMPRESSION: No edema or consolidation. Electronically Signed   By: Bretta BangWilliam  Woodruff III M.D.   On: 12/17/2016 12:49    Procedures Procedures (including critical care time)  Medications Ordered in ED Medications  HYDROcodone-acetaminophen (NORCO/VICODIN) 5-325 MG per tablet 2 tablet (2 tablets Oral Given 12/17/16 1207)  docusate (COLACE) 50 MG/5ML liquid 50 mg (50 mg Oral Given 12/17/16 1232)    Initial Impression / Assessment and Plan / ED Course  I have reviewed the triage vital signs and the nursing notes.  Pertinent labs & imaging results that were available during my care of the patient were reviewed by me and considered in my medical decision making (see chart for details).     41 year old male with a h/o of migraines, HTN, and allergies with headache dizziness and shortness  of breath. Ddx: otitis media, CVA, sinus infection, migraine, pneumonia. CXR shows no acute cardiopulmonary processes. Lungs are CTA bilaterally. On HENT exam, the bilateral TMs are occluded. Treated with colace and bilateral TMs were visualized. No dizziness reported in the ED, and the patient successfully ambulated through the unit. TMs do not appear infected. Will discharge to home with supportive treatment. Discussed reasons to return including fever and worsening symptoms and follow up with PCP as needed. The patient is agreeable with the plan at this time.   Final Clinical Impressions(s) / ED Diagnoses   Final diagnoses:  None    New Prescriptions New Prescriptions   No medications on file     Cortlin Marano Conan Bowensdair Jillyan Plitt, PA-C 12/17/16 1439    Courteney Lyn Mackuen, MD 12/18/16 1430

## 2016-12-17 NOTE — ED Triage Notes (Signed)
Pt stated that he has been having SOB since last night, His left nostril is stopped up, he is having pain on the left side of his face, radiating to the left side of his neck. His right leg feels like It is pulsating.

## 2016-12-17 NOTE — ED Notes (Signed)
Placed patient into a gown and on the monitor waiting for provider 

## 2016-12-17 NOTE — ED Notes (Signed)
Pt's ears irrigated with warm water to aid in wax removal. Unable to get all of wax out of left ear. Used colace drops x 3, followed by irrigation after setting for 10 mins. The right ear canal is cleared out. Was able to remove large ball of wax.

## 2016-12-17 NOTE — ED Notes (Addendum)
Pt ambulated independently well. No dizziness.

## 2016-12-22 ENCOUNTER — Emergency Department (HOSPITAL_COMMUNITY)
Admission: EM | Admit: 2016-12-22 | Discharge: 2016-12-22 | Disposition: A | Discharge status: Home / Self Care | Payer: BC Managed Care – PPO | Attending: Emergency Medicine | Admitting: Emergency Medicine

## 2016-12-22 ENCOUNTER — Emergency Department (HOSPITAL_COMMUNITY): Payer: BC Managed Care – PPO

## 2016-12-22 ENCOUNTER — Encounter (HOSPITAL_COMMUNITY): Payer: Self-pay

## 2016-12-22 DIAGNOSIS — I10 Essential (primary) hypertension: Secondary | ICD-10-CM | POA: Diagnosis not present

## 2016-12-22 DIAGNOSIS — R1012 Left upper quadrant pain: Secondary | ICD-10-CM | POA: Diagnosis not present

## 2016-12-22 HISTORY — DX: Migraine, unspecified, not intractable, without status migrainosus: G43.909

## 2016-12-22 HISTORY — DX: Sleep apnea, unspecified: G47.30

## 2016-12-22 LAB — CBC
HEMATOCRIT: 42.2 % (ref 39.0–52.0)
HEMOGLOBIN: 14.5 g/dL (ref 13.0–17.0)
MCH: 26.7 pg (ref 26.0–34.0)
MCHC: 34.4 g/dL (ref 30.0–36.0)
MCV: 77.6 fL — AB (ref 78.0–100.0)
Platelets: 309 10*3/uL (ref 150–400)
RBC: 5.44 MIL/uL (ref 4.22–5.81)
RDW: 13.7 % (ref 11.5–15.5)
WBC: 6.6 10*3/uL (ref 4.0–10.5)

## 2016-12-22 LAB — URINALYSIS, ROUTINE W REFLEX MICROSCOPIC
BILIRUBIN URINE: NEGATIVE
GLUCOSE, UA: NEGATIVE mg/dL
HGB URINE DIPSTICK: NEGATIVE
Ketones, ur: NEGATIVE mg/dL
Leukocytes, UA: NEGATIVE
Nitrite: NEGATIVE
PH: 7 (ref 5.0–8.0)
Protein, ur: NEGATIVE mg/dL
SPECIFIC GRAVITY, URINE: 1.006 (ref 1.005–1.030)

## 2016-12-22 LAB — COMPREHENSIVE METABOLIC PANEL
ALBUMIN: 4.6 g/dL (ref 3.5–5.0)
ALK PHOS: 104 U/L (ref 38–126)
ALT: 16 U/L — ABNORMAL LOW (ref 17–63)
ANION GAP: 10 (ref 5–15)
AST: 32 U/L (ref 15–41)
BUN: 11 mg/dL (ref 6–20)
CALCIUM: 9.2 mg/dL (ref 8.9–10.3)
CHLORIDE: 103 mmol/L (ref 101–111)
CO2: 24 mmol/L (ref 22–32)
Creatinine, Ser: 1.01 mg/dL (ref 0.61–1.24)
GFR calc Af Amer: >60 mL/min (ref >60)
GFR calc non Af Amer: >60 mL/min (ref >60)
GLUCOSE: 100 mg/dL — AB (ref 65–99)
POTASSIUM: 4.1 mmol/L (ref 3.5–5.1)
SODIUM: 137 mmol/L (ref 135–145)
Total Bilirubin: 1.7 mg/dL — ABNORMAL HIGH (ref 0.3–1.2)
Total Protein: 8.3 g/dL — ABNORMAL HIGH (ref 6.5–8.1)

## 2016-12-22 LAB — LIPASE, BLOOD: LIPASE: 25 U/L (ref 11–51)

## 2016-12-22 MED ORDER — IOPAMIDOL (ISOVUE-300) INJECTION 61%
INTRAVENOUS | Status: AC
  Filled 2016-12-22: qty 100

## 2016-12-22 MED ORDER — IOPAMIDOL (ISOVUE-300) INJECTION 61%
100.0000 mL | Freq: Once | INTRAVENOUS | Status: AC | PRN
Start: 2016-12-22 — End: 2016-12-22
  Administered 2016-12-22: 100 mL via INTRAVENOUS

## 2016-12-22 MED ORDER — ACETAMINOPHEN 325 MG PO TABS
650.0000 mg | ORAL_TABLET | Freq: Once | ORAL | Status: AC
  Administered 2016-12-22: 650 mg via ORAL
  Filled 2016-12-22: qty 2

## 2016-12-22 NOTE — ED Provider Notes (Signed)
[facility]-EMERGENCY DEPT Provider Note   CSN: [id number] Arrival date & time: 12/22/16  0741     History   Chief Complaint Chief Complaint  Patient presents with  . Abdominal Pain    HPI Dylan Guerrero is a 41 y.o. male with PMHx of GERD, inguinal and umbilical hernia, HTN presents today with complaints of LUQ abdominal pain 1 week. He reports his pain has been worsening, constant, sharp, 8/10. He denies fever, chills, chest pain, nausea, vomiting, diarrhea, urinary symptoms, changes in appetite, trauma. He states movement and palpation makes symptoms worse. He states he has tried naproxen with no relief. He states nothing makes his symptoms better. He admits to past abdominal surgeries consisting of 2 hernia repair and appendectomy.      The history is provided by the patient. No language interpreter was used.    Past Medical History:  Diagnosis Date  . Abdominal pain   . Allergy   . Arthritis   . Carpal tunnel syndrome   . GERD (gastroesophageal reflux disease)   . Headache(784.0)   . Hernia   . Hypertension   . Hypokalemia   . Infection of the inner ear   . Migraine   . Sinus infection   . Sleep apnea   . Spider bite     Patient Active Problem List   Diagnosis Date Noted  . Migraine with aura 01/09/2013  . Umbilical pain 10/22/2011    Past Surgical History:  Procedure Laterality Date  . APPENDECTOMY  1984  . HERNIA REPAIR  01/30/2011   Right Inguinal Hernia repair with mesh  . HERNIA REPAIR  01/30/2011   Umbilical Hernia repair with meash       Home Medications    Prior to Admission medications   Medication Sig Start Date End Date Taking? Authorizing Provider  amLODipine (NORVASC) 10 MG tablet Take 10 mg by mouth daily.   Yes Historical Provider, MD  amoxicillin (AMOXIL) 500 MG capsule Take 1 capsule (500 mg total) by mouth 3 (three) times daily. 12/17/16 12/22/16 Yes Mia Conan Bowens, PA-C  cyclobenzaprine (FLEXERIL) 10 MG tablet Take 10 mg by mouth 2  (two) times daily as needed for muscle spasms.   Yes Historical Provider, MD  fluticasone (FLONASE) 50 MCG/ACT nasal spray Place 2 sprays into both nostrils 2 (two) times daily.    Yes Historical Provider, MD  hydrochlorothiazide (HYDRODIURIL) 25 MG tablet Take 25 mg by mouth daily.   Yes Historical Provider, MD  levocetirizine (XYZAL) 5 MG tablet Take 5 mg by mouth at bedtime.    Yes Historical Provider, MD  Multiple Vitamin (MULTIVITAMIN WITH MINERALS) TABS tablet Take 1 tablet by mouth daily.   Yes Historical Provider, MD  naproxen (NAPROSYN) 500 MG tablet Take 500 mg by mouth 2 (two) times daily as needed for mild pain.    Yes Historical Provider, MD  omeprazole (PRILOSEC) 20 MG capsule Take 20 mg by mouth every evening.    Yes Historical Provider, MD  propranolol ER (INDERAL LA) 60 MG 24 hr capsule Take 1 capsule (60 mg total) by mouth daily. 01/03/16  Yes Suanne Marker, MD  vitamin C (ASCORBIC ACID) 500 MG tablet Take 500 mg by mouth daily.   Yes Historical Provider, MD    Family History Family History  Problem Relation Age of Onset  . Scleroderma Mother   . Diabetes    . Hypertension      Social History Social History  Substance Use Topics  . Smoking  status: Never Smoker  . Smokeless tobacco: Never Used  . Alcohol use No     Comment: quit 10/05/2009     Allergies   Ibuprofen and Ketoprofen   Review of Systems Review of Systems  Constitutional: Negative for chills and fever.  Respiratory: Negative for shortness of breath.   Cardiovascular: Negative for chest pain.  Gastrointestinal: Positive for abdominal pain (LUQ). Negative for diarrhea, nausea and vomiting.  Genitourinary: Negative for difficulty urinating and dysuria.  Skin: Negative for wound.  All other systems reviewed and are negative.    Physical Exam Updated Vital Signs BP 112/80   Pulse 76   Temp 97.6 F (36.4 C) (Oral)   Resp 16   Ht 6\' 2"  (1.88 m)   Wt 120.2 kg   SpO2 97%   BMI 34.02 kg/m    Physical Exam  Constitutional: He is oriented to person, place, and time. He appears well-developed and well-nourished.  Well appearing  HENT:  Head: Normocephalic and atraumatic.  Nose: Nose normal.  Mouth/Throat: Oropharynx is clear and moist.  Eyes: Conjunctivae and EOM are normal.  Neck: Normal range of motion.  Cardiovascular: Normal rate, normal heart sounds and intact distal pulses.   No murmur heard. Pulmonary/Chest: Effort normal and breath sounds normal. No respiratory distress. He has no wheezes. He has no rales.  Normal work of breathing. No respiratory distress noted.   Abdominal: Soft. There is tenderness (LUQ). There is guarding. There is no rebound.  Negative Murphy sign. No focal tenderness at McBurney's point. Negative CVA tenderness.  Musculoskeletal: Normal range of motion.  Neurological: He is alert and oriented to person, place, and time.  Skin: Skin is warm.  Psychiatric: He has a normal mood and affect. His behavior is normal.  Nursing note and vitals reviewed.    ED Treatments / Results  Labs (all labs ordered are listed, but only abnormal results are displayed) Labs Reviewed  COMPREHENSIVE METABOLIC PANEL - Abnormal; Notable for the following:       Result Value   Glucose, Bld 100 (*)    Total Protein 8.3 (*)    ALT 16 (*)    Total Bilirubin 1.7 (*)    All other components within normal limits  CBC - Abnormal; Notable for the following:    MCV 77.6 (*)    All other components within normal limits  URINALYSIS, ROUTINE W REFLEX MICROSCOPIC - Abnormal; Notable for the following:    Color, Urine STRAW (*)    All other components within normal limits  LIPASE, BLOOD    EKG  EKG Interpretation None       Radiology Ct Abdomen Pelvis W Contrast  Result Date: 12/22/2016 CLINICAL DATA:  One week history of abdominal pain EXAM: CT ABDOMEN AND PELVIS WITH CONTRAST TECHNIQUE: Multidetector CT imaging of the abdomen and pelvis was performed using  the standard protocol following bolus administration of intravenous contrast. CONTRAST:  100mL ISOVUE-300 IOPAMIDOL (ISOVUE-300) INJECTION 61% COMPARISON:  July 09, 2010 FINDINGS: Lower chest: Lung bases are clear. Hepatobiliary: No focal liver lesions are evident. Gallbladder wall is not appreciably thickened. There is no biliary duct dilatation. Pancreas: No pancreatic mass or inflammatory focus. Spleen: No splenic lesions are evident. There is a small splenule medial and superior to the spleen. Adrenals/Urinary Tract: Adrenals appear normal bilaterally. Kidneys bilaterally show no mass or hydronephrosis on either side. There is no renal or ureteral calculus on either side. Urinary bladder is midline with wall thickness within normal limits. Stomach/Bowel: There  is no appreciable bowel wall or mesenteric thickening. There are scattered colonic diverticula without diverticulitis. No bowel obstruction. No free air or portal venous air. Vascular/Lymphatic: There is no abdominal aortic aneurysm. No vascular lesions are appreciable. No adenopathy is evident in the abdomen or pelvis. Reproductive: Prostate and seminal vesicles appear normal in size and contour. No pelvic mass. Other: Appendix is absent. The patient has had previous hiatal hernia repair with scarring in the anterior abdominal wall in this area. No hernia currently evident, although there is thinning of the rectus muscles medially. No abscess or ascites is evident in the abdomen or pelvis. Musculoskeletal: There are no blastic or lytic bone lesions. No intramuscular or abdominal wall lesion. IMPRESSION: Scarring in the anterior abdominal wall at the site of a previous ventral hernia repair. There is thinning of the rectus muscle in the midline in this area but no recurrent hernia. Scattered colonic diverticula without diverticulitis. No bowel obstruction. No abscess. Appendix absent. No renal or ureteral calculus.  No hydronephrosis. Electronically  Signed   By: Bretta Bang III M.D.   On: 12/22/2016 13:45    Procedures Procedures (including critical care time)  Medications Ordered in ED Medications  iopamidol (ISOVUE-300) 61 % injection (not administered)  acetaminophen (TYLENOL) tablet 650 mg (650 mg Oral Given 12/22/16 1241)  iopamidol (ISOVUE-300) 61 % injection 100 mL (100 mLs Intravenous Contrast Given 12/22/16 1310)     Initial Impression / Assessment and Plan / ED Course  I have reviewed the triage vital signs and the nursing notes.  Pertinent labs & imaging results that were available during my care of the patient were reviewed by me and considered in my medical decision making (see chart for details).    Pt here with LUQ abdominal pain. No evidence of recurrent hernia, diverticulitis, bowel obstruction, abscess, evidence of renal or ureteral calculus, hydronephrosis via CT. Low suspicion for PNA due to no recent URI symptoms, no cough, no fever, and lower ribs clear on CT. Low suspicion for PE.  Perc negative. Normal work of breathing, afebrile, NAD here in ED. Heart and lungs clear to auscultation. Abdomen mildly tender to LUQ with mild guarding. No rebound tenderness. Negative CVA tenderness. He also denies any gastric symptoms. Labwork is reassuring. Patient is to follow up with his PCP regarding today's visit. Encouraged symptomatic treatment with tylenol and warm compress. Reasons to return to the ED discussed. He is in NAD, hemodynamically stable and afebrile prior to discharge. Able to tolerate PO without difficulty.    Final Clinical Impressions(s) / ED Diagnoses   Final diagnoses:  Left upper quadrant pain    New Prescriptions Discharge Medication List as of 12/22/2016  3:01 PM       [address] [place], [place] 12/22/16 1604    Benjiman Core, MD 12/23/16 1047

## 2016-12-22 NOTE — ED Triage Notes (Signed)
Per EMS, Pt, from home, c/o LUQ abdominal pain x 1 week.  Pain score 8/10. Denies n/v/d.  Hx of Inguinal and Umbilical hernia.

## 2016-12-22 NOTE — Discharge Instructions (Signed)
Please schedule appointment with your primary care provider within 1 week regarding today's visit. Take tylenol as needed for pain. Use cold/warm compress to the area.   Get help right away if: Your pain does not go away as soon as your health care provider told you to expect. You cannot stop throwing up. Your pain is only in areas of the abdomen, such as the right side or the left lower portion of the abdomen. You have bloody or black stools, or stools that look like tar. You have severe pain, cramping, or bloating in your abdomen. You have signs of dehydration, such as: Dark urine, very little urine, or no urine. Cracked lips. Dry mouth. Sunken eyes. Sleepiness. Weakness.

## 2017-02-10 ENCOUNTER — Other Ambulatory Visit: Payer: Self-pay | Admitting: Otolaryngology

## 2017-02-10 DIAGNOSIS — R1310 Dysphagia, unspecified: Secondary | ICD-10-CM

## 2017-02-10 DIAGNOSIS — J989 Respiratory disorder, unspecified: Secondary | ICD-10-CM

## 2017-02-10 DIAGNOSIS — R0989 Other specified symptoms and signs involving the circulatory and respiratory systems: Secondary | ICD-10-CM

## 2017-02-12 ENCOUNTER — Emergency Department (HOSPITAL_COMMUNITY): Payer: BC Managed Care – PPO

## 2017-02-12 ENCOUNTER — Encounter (HOSPITAL_COMMUNITY): Payer: Self-pay | Admitting: Emergency Medicine

## 2017-02-12 ENCOUNTER — Emergency Department (HOSPITAL_COMMUNITY)
Admission: EM | Admit: 2017-02-12 | Discharge: 2017-02-12 | Disposition: A | Discharge status: Home / Self Care | Payer: BC Managed Care – PPO | Attending: Emergency Medicine | Admitting: Emergency Medicine

## 2017-02-12 ENCOUNTER — Other Ambulatory Visit: Payer: BC Managed Care – PPO

## 2017-02-12 DIAGNOSIS — J385 Laryngeal spasm: Secondary | ICD-10-CM | POA: Insufficient documentation

## 2017-02-12 DIAGNOSIS — Z79899 Other long term (current) drug therapy: Secondary | ICD-10-CM | POA: Diagnosis not present

## 2017-02-12 DIAGNOSIS — I10 Essential (primary) hypertension: Secondary | ICD-10-CM | POA: Diagnosis not present

## 2017-02-12 DIAGNOSIS — R0602 Shortness of breath: Secondary | ICD-10-CM | POA: Diagnosis present

## 2017-02-12 LAB — BASIC METABOLIC PANEL
Anion gap: 10 (ref 5–15)
BUN: 11 mg/dL (ref 6–20)
CALCIUM: 9.5 mg/dL (ref 8.9–10.3)
CHLORIDE: 99 mmol/L — AB (ref 101–111)
CO2: 29 mmol/L (ref 22–32)
CREATININE: 1.26 mg/dL — AB (ref 0.61–1.24)
Glucose, Bld: 92 mg/dL (ref 65–99)
Potassium: 3.2 mmol/L — ABNORMAL LOW (ref 3.5–5.1)
SODIUM: 138 mmol/L (ref 135–145)

## 2017-02-12 LAB — URINALYSIS, ROUTINE W REFLEX MICROSCOPIC
Bilirubin Urine: NEGATIVE
GLUCOSE, UA: NEGATIVE mg/dL
Hgb urine dipstick: NEGATIVE
KETONES UR: NEGATIVE mg/dL
LEUKOCYTES UA: NEGATIVE
NITRITE: NEGATIVE
PROTEIN: NEGATIVE mg/dL
Specific Gravity, Urine: 1.002 — ABNORMAL LOW (ref 1.005–1.030)
pH: 7 (ref 5.0–8.0)

## 2017-02-12 LAB — CBC
HCT: 43.5 % (ref 39.0–52.0)
Hemoglobin: 14.7 g/dL (ref 13.0–17.0)
MCH: 26.7 pg (ref 26.0–34.0)
MCHC: 33.8 g/dL (ref 30.0–36.0)
MCV: 78.9 fL (ref 78.0–100.0)
PLATELETS: 320 10*3/uL (ref 150–400)
RBC: 5.51 MIL/uL (ref 4.22–5.81)
RDW: 13.1 % (ref 11.5–15.5)
WBC: 6.4 10*3/uL (ref 4.0–10.5)

## 2017-02-12 LAB — D-DIMER, QUANTITATIVE (NOT AT ARMC)

## 2017-02-12 MED ORDER — IOPAMIDOL (ISOVUE-300) INJECTION 61%
INTRAVENOUS | Status: AC
  Administered 2017-02-12: 100 mL
  Filled 2017-02-12: qty 75

## 2017-02-12 MED ORDER — POTASSIUM CHLORIDE CRYS ER 20 MEQ PO TBCR: 40.0000 meq | EXTENDED_RELEASE_TABLET | Freq: Two times a day (BID) | ORAL | 0 refills | Status: DC

## 2017-02-12 NOTE — ED Provider Notes (Signed)
[facility]-EMERGENCY DEPT Provider Note   CSN: [id number] Arrival date & time: 02/12/17  1243     History   Chief Complaint Chief Complaint  Patient presents with  . Dizziness  . Shortness of Breath    HPI Dylan Guerrero is a 41 y.o. male who presents to the Emergency Department for acute shortness of breath. He reports that he was sitting down to eat his lunch when he became short of breath suddenly with associated lightheaded, dizziness, and tingling in his bilateral hands and feet. He also reports that he feels that something has been stuck in his throat for some time. He denies CP, He reports a h/o of chronic abdominal pain that is unchanged, chronic dizziness for which he has been evaluated by ENT.   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 Jearld Fenton ENT on 5/09. Transnasal laryngoscopy on 3/28 demonstrated laryngopharyngeal reflux.   The history is provided by the patient. No language interpreter was used.    Past Medical History:  Diagnosis Date  . Abdominal pain   . Allergy   . Arthritis   . Carpal tunnel syndrome   . GERD (gastroesophageal reflux disease)   . Headache(784.0)   . Hernia   . Hypertension   . Hypokalemia   . Infection of the inner ear   . Migraine   . Sinus infection   . Sleep apnea   . Spider bite     Patient Active Problem List   Diagnosis Date Noted  . Migraine with aura 01/09/2013  . Umbilical pain 10/22/2011    Past Surgical History:  Procedure Laterality Date  . APPENDECTOMY  1984  . HERNIA REPAIR  01/30/2011   Right Inguinal Hernia repair with mesh  . HERNIA REPAIR  01/30/2011   Umbilical Hernia repair with meash       Home Medications    Prior to Admission medications   Medication Sig Start Date End Date Taking? Authorizing Provider  acetaminophen (TYLENOL) 500 MG tablet Take 500 mg by mouth every 6 (six) hours as needed (pain).   Yes [provider]    albuterol (PROVENTIL HFA;VENTOLIN HFA) 108 (90 Base) MCG/ACT inhaler Inhale 2 puffs into the lungs every 6 (six) hours as needed for wheezing or shortness of breath.   Yes [provider]  amLODipine (NORVASC) 10 MG tablet Take 10 mg by mouth daily.   Yes [provider]  cyclobenzaprine (FLEXERIL) 10 MG tablet Take 10 mg by mouth 2 (two) times daily as needed for muscle spasms.   Yes [provider]  hydrochlorothiazide (HYDRODIURIL) 25 MG tablet Take 25 mg by mouth daily.   Yes [provider]  loratadine (CLARITIN) 10 MG tablet Take 10 mg by mouth daily.   Yes [provider]  Multiple Vitamin (MULTIVITAMIN WITH MINERALS) TABS tablet Take 1 tablet by mouth daily.   Yes [provider]  pantoprazole (PROTONIX) 40 MG tablet Take 40 mg by mouth 2 (two) times daily.   Yes [provider]  propranolol ER (INDERAL LA) 60 MG 24 hr capsule Take 1 capsule (60 mg total) by mouth daily. 01/03/16  Yes Penumalli, Glenford Bayley, MD  ranitidine (ZANTAC) 300 MG capsule Take 300 mg by mouth every evening.   Yes [provider]  vitamin B-12 (CYANOCOBALAMIN) 1000 MCG tablet Take 1,000 mcg by mouth daily.   Yes [provider]  potassium chloride SA (K-DUR,KLOR-CON) 20 MEQ tablet Take 2  tablets (40 mEq total) by mouth 2 (two) times daily. 02/12/17   Sonjia Wilcoxson A, PA-C    Family History Family History  Problem Relation Age of Onset  . Scleroderma Mother   . Diabetes Unknown   . Hypertension Unknown     Social History Social History  Substance Use Topics  . Smoking status: Never Smoker  . Smokeless tobacco: Never Used  . Alcohol use No     Comment: quit 10/05/2009     Allergies   Ibuprofen and Ketoprofen   Review of Systems Review of Systems  Constitutional: Negative for chills and fever.  HENT: Negative for congestion and sore throat.   Eyes: Negative for visual disturbance.  Respiratory: Positive for chest  tightness and shortness of breath. Negative for cough and wheezing.   Cardiovascular: Negative for chest pain and leg swelling.  Gastrointestinal: Positive for abdominal pain (chronic and unchanged). Negative for diarrhea, nausea and vomiting.  Genitourinary: Negative for dysuria and flank pain.  Musculoskeletal: Positive for neck pain and neck stiffness.  Skin: Negative for rash.  Allergic/Immunologic: Negative for immunocompromised state.  Neurological: Negative for headaches.       Tingling  Psychiatric/Behavioral: Negative for confusion.     Physical Exam Updated Vital Signs BP 112/76 (BP Location: Left Arm)   Pulse 81   Temp 97.8 F (36.6 C) (Oral)   Resp 20   SpO2 97%   Physical Exam  Constitutional: He is oriented to person, place, and time. He appears well-developed and well-nourished.  HENT:  Head: Normocephalic and atraumatic.  Right Ear: Tympanic membrane and external ear normal.  Left Ear: Tympanic membrane and external ear normal.  Nose: Nose normal.  Mouth/Throat: Uvula is midline, oropharynx is clear and moist and mucous membranes are normal.  Eyes: Conjunctivae are normal.  Neck: Normal range of motion. Neck supple. No JVD present. No tracheal deviation present. No thyromegaly present.  Mild upper airways sounds noted with auscultation.   Cardiovascular: Normal rate, regular rhythm, normal heart sounds and intact distal pulses.  Exam reveals no gallop and no friction rub.   No murmur heard. Pulmonary/Chest: Effort normal and breath sounds normal. No respiratory distress. He has no wheezes. He has no rales.  Abdominal: Soft. He exhibits no distension. There is tenderness. There is no guarding.  Well-healed surgical scars superior to the umbilicus, consistent with hernia repair. Left upper quadrant discomfort with palpation.   Musculoskeletal: Normal range of motion. He exhibits no edema, tenderness or deformity.  Lymphadenopathy:    He has no cervical  adenopathy.  Neurological: He is alert and oriented to person, place, and time.  Normal gait. No sensory deficits in the bilateral upper and lower extremities.   Skin: Skin is warm and dry.  Psychiatric: His behavior is normal.  Nursing note and vitals reviewed.  ED Treatments / Results  Labs (all labs ordered are listed, but only abnormal results are displayed) Labs Reviewed  BASIC METABOLIC PANEL - Abnormal; Notable for the following:       Result Value   Potassium 3.2 (*)    Chloride 99 (*)    Creatinine, Ser 1.26 (*)    All other components within normal limits  URINALYSIS, ROUTINE W REFLEX MICROSCOPIC - Abnormal; Notable for the following:    Color, Urine COLORLESS (*)    Specific Gravity, Urine 1.002 (*)    All other components within normal limits  CBC  D-DIMER, QUANTITATIVE (NOT AT [facility])    EKG  EKG Interpretation None  Radiology Dg Chest 2 View  Result Date: 02/12/2017 CLINICAL DATA:  Shortness of breath and generalized weakness. Tachypnea. EXAM: CHEST  2 VIEW COMPARISON:  12/17/2016 FINDINGS: The heart size and mediastinal contours are within normal limits. Both lungs are clear. The visualized skeletal structures are unremarkable. IMPRESSION: Normal chest x-ray Electronically Signed   By: Rudie Meyer M.D.   On: 02/12/2017 16:03   Ct Soft Tissue Neck W Contrast  Result Date: 02/12/2017 CLINICAL DATA:  Dyspnea.  Globus sensation. EXAM: CT NECK WITH CONTRAST TECHNIQUE: Multidetector CT imaging of the neck was performed using the standard protocol following the bolus administration of intravenous contrast. CONTRAST:  < 75 mL > ISOVUE-300 IOPAMIDOL (ISOVUE-300) INJECTION 61% COMPARISON:  None. FINDINGS: Pharynx and larynx: Normal. No mass or swelling. Salivary glands: No inflammation, mass, or stone. Thyroid: Negative Lymph nodes: No enlarged or pathologic lymph nodes in the neck. Vascular: Negative Limited intracranial: Negative Visualized orbits: Negative Mastoids  and visualized paranasal sinuses: Negative Skeleton: Negative Upper chest: Negative Other: None IMPRESSION: Negative CT of the neck. Electronically Signed   By: Marlan Palau M.D.   On: 02/12/2017 16:14    Procedures Procedures (including critical care time)  Medications Ordered in ED Medications  iopamidol (ISOVUE-300) 61 % injection (100 mLs  Contrast Given 02/12/17 1543)     Initial Impression / Assessment and Plan / ED Course  I have reviewed the triage vital signs and the nursing notes.  Pertinent labs & imaging results that were available during my care of the patient were reviewed by me and considered in my medical decision making (see chart for details).     41 year old male with multiple vague complaints including acute non-exertional dyspnea lightheadedness, and tingling in the bilateral upper and lower extremities. He also reports dizziness, which has previously been evaluated by Dr. Jearld Fenton in ENT. NAD. VSS improving in the ED, improved tachypnea since arrival. SaO2 in the high 90s. Patient is resting comfortably in bed; previously walk to the restroom to give a urine sample. EKG with NSR. Cr at patient's baseline of 1.2-1.3. CBC unremarkable. This patient was seen by me on 3/15 for dyspnea and otalgia. After reviewing the patients chart, he is scheduled for an outpatient CT of the soft tissues of the neck in 4 days. Will go ahead and perform here since pulmonary exam is unremarkable and the patient's O2 Sats are in the high 90s. Discussed that patient with Dr. Rosalia Hammers, attending physician. CT neck is negative. CXR is clear. Leading diagnosis is laryngospasm given unremarkably pulmonary work up during this visit and previous. Will discharge the patient to home with education on improving dyspnea related to laryngospasms, including panting and breathing through a straw. Encouraged the patient to contact Dr. Tona Sensing office about today's visit and for follow-up.   Final Clinical Impressions(s)  / ED Diagnoses   Final diagnoses:  Laryngospasm    New Prescriptions Discharge Medication List as of 02/12/2017  5:21 PM    START taking these medications   Details  potassium chloride SA (K-DUR,KLOR-CON) 20 MEQ tablet Take 2 tablets (40 mEq total) by mouth 2 (two) times daily., Starting Fri 02/12/2017, Print         Brittain Smithey A, PA-C 02/13/17 0111    Margarita Grizzle, MD 02/19/17 [phone]

## 2017-02-12 NOTE — Discharge Instructions (Signed)
If you develop severe worsening shortness of breath, fever, chills, or worsening symptoms, please return to the Emergency Department for re-evaluation. Please call Dr. Tona SensingByer's office and let him know about today's visit and the CT that was performed. If you become short of breath, you can try to use your albuterol inhaler, but you can also try other techniques such as breathing through a straw or forcing yourself to pant to help with the spasms. Continue to use your CPAP as directed.

## 2017-02-12 NOTE — ED Triage Notes (Signed)
Pt reports SOB, lightheadedness, and generalized body numbness for the last hour. Began just prior eating. Ate some of lunch prior to arrival, but this did not help.

## 2017-02-15 ENCOUNTER — Other Ambulatory Visit: Payer: BC Managed Care – PPO

## 2017-03-31 ENCOUNTER — Other Ambulatory Visit: Payer: Self-pay

## 2017-03-31 ENCOUNTER — Ambulatory Visit (HOSPITAL_COMMUNITY)
Admission: EM | Admit: 2017-03-31 | Discharge: 2017-03-31 | Disposition: A | Discharge status: Nursing Home (Includes ICF) | Payer: BC Managed Care – PPO | Source: Home / Self Care

## 2017-03-31 ENCOUNTER — Encounter (HOSPITAL_COMMUNITY): Payer: Self-pay | Admitting: Emergency Medicine

## 2017-03-31 ENCOUNTER — Emergency Department (HOSPITAL_COMMUNITY): Payer: BC Managed Care – PPO

## 2017-03-31 ENCOUNTER — Emergency Department (HOSPITAL_COMMUNITY)
Admission: EM | Admit: 2017-03-31 | Discharge: 2017-03-31 | Disposition: A | Discharge status: Home / Self Care | Payer: BC Managed Care – PPO | Attending: Emergency Medicine | Admitting: Emergency Medicine

## 2017-03-31 DIAGNOSIS — R072 Precordial pain: Secondary | ICD-10-CM | POA: Insufficient documentation

## 2017-03-31 DIAGNOSIS — I10 Essential (primary) hypertension: Secondary | ICD-10-CM | POA: Diagnosis not present

## 2017-03-31 DIAGNOSIS — R079 Chest pain, unspecified: Secondary | ICD-10-CM | POA: Diagnosis present

## 2017-03-31 LAB — CBC
HCT: 44.6 % (ref 39.0–52.0)
Hemoglobin: 15.3 g/dL (ref 13.0–17.0)
MCH: 27.3 pg (ref 26.0–34.0)
MCHC: 34.3 g/dL (ref 30.0–36.0)
MCV: 79.5 fL (ref 78.0–100.0)
Platelets: 339 10*3/uL (ref 150–400)
RBC: 5.61 MIL/uL (ref 4.22–5.81)
RDW: 13.5 % (ref 11.5–15.5)
WBC: 6.8 10*3/uL (ref 4.0–10.5)

## 2017-03-31 LAB — BASIC METABOLIC PANEL
Anion gap: 11 (ref 5–15)
BUN: 8 mg/dL (ref 6–20)
CHLORIDE: 100 mmol/L — AB (ref 101–111)
CO2: 27 mmol/L (ref 22–32)
Calcium: 9.6 mg/dL (ref 8.9–10.3)
Creatinine, Ser: 1.18 mg/dL (ref 0.61–1.24)
GFR calc Af Amer: >60 mL/min (ref >60)
GFR calc non Af Amer: >60 mL/min (ref >60)
Glucose, Bld: 91 mg/dL (ref 65–99)
Potassium: 3.4 mmol/L — ABNORMAL LOW (ref 3.5–5.1)
SODIUM: 138 mmol/L (ref 135–145)

## 2017-03-31 LAB — I-STAT TROPONIN, ED: Troponin i, poc: 0 ng/mL (ref 0.00–0.08)

## 2017-03-31 MED ORDER — ACETAMINOPHEN 500 MG PO TABS
500.0000 mg | ORAL_TABLET | Freq: Once | ORAL | Status: AC
  Administered 2017-03-31: 500 mg via ORAL
  Filled 2017-03-31: qty 1

## 2017-03-31 NOTE — ED Provider Notes (Signed)
[facility]-EMERGENCY DEPT Provider Note   CSN: [id number] Arrival date & time: 03/31/17  1635     History   Chief Complaint Chief Complaint  Patient presents with  . Chest Pain    HPI Dylan Guerrero is a 41 y.o. male presenting with three-day history of intermittent chest pain.  Patient states that for the past several days he has felt this left-sided chest pain that lasts for a few seconds before it goes away. The pain is sharp, and can be triggered with movement of the arm. He denies associated shortness of breath, nausea, vomiting, diaphoresis, or dizziness. He denies fever, chills, or abdominal pain. Patient went to see his primary care doctor yesterday and was diagnosed with musculoskeletal chest wall pain. He was told to take Tylenol and given a prescription for Robaxin. He took one dose of Tylenol this morning, and has not taken any of the Robaxin.   HPI  Past Medical History:  Diagnosis Date  . Abdominal pain   . Allergy   . Arthritis   . Carpal tunnel syndrome   . GERD (gastroesophageal reflux disease)   . Headache(784.0)   . Hernia   . Hypertension   . Hypokalemia   . Infection of the inner ear   . Migraine   . Sinus infection   . Sleep apnea   . Spider bite     Patient Active Problem List   Diagnosis Date Noted  . Migraine with aura 01/09/2013  . Umbilical pain 10/22/2011    Past Surgical History:  Procedure Laterality Date  . APPENDECTOMY  1984  . HERNIA REPAIR  01/30/2011   Right Inguinal Hernia repair with mesh  . HERNIA REPAIR  01/30/2011   Umbilical Hernia repair with meash       Home Medications    Prior to Admission medications   Medication Sig Start Date End Date Taking? Authorizing Provider  acetaminophen (TYLENOL) 500 MG tablet Take 500 mg by mouth every 6 (six) hours as needed (pain).    [provider]  albuterol (PROVENTIL HFA;VENTOLIN HFA) 108 (90 Base) MCG/ACT inhaler Inhale 2 puffs into the lungs every 6 (six) hours as  needed for wheezing or shortness of breath.    [provider]  amLODipine (NORVASC) 10 MG tablet Take 10 mg by mouth daily.    [provider]  cyclobenzaprine (FLEXERIL) 10 MG tablet Take 10 mg by mouth 2 (two) times daily as needed for muscle spasms.    [provider]  hydrochlorothiazide (HYDRODIURIL) 25 MG tablet Take 25 mg by mouth daily.    [provider]  loratadine (CLARITIN) 10 MG tablet Take 10 mg by mouth daily.    [provider]  Multiple Vitamin (MULTIVITAMIN WITH MINERALS) TABS tablet Take 1 tablet by mouth daily.    [provider]  pantoprazole (PROTONIX) 40 MG tablet Take 40 mg by mouth 2 (two) times daily.    [provider]  potassium chloride SA (K-DUR,KLOR-CON) 20 MEQ tablet Take 2 tablets (40 mEq total) by mouth 2 (two) times daily. 02/12/17   McDonald, Mia A, PA-C  propranolol ER (INDERAL LA) 60 MG 24 hr capsule Take 1 capsule (60 mg total) by mouth daily. 01/03/16   Penumalli, Glenford Bayley, MD  ranitidine (ZANTAC) 300 MG capsule Take 300 mg by mouth every evening.    [provider]  vitamin B-12 (CYANOCOBALAMIN) 1000 MCG tablet Take 1,000 mcg by mouth daily.    [provider]  Family History Family History  Problem Relation Age of Onset  . Scleroderma Mother   . Diabetes Unknown   . Hypertension Unknown     Social History Social History  Substance Use Topics  . Smoking status: Never Smoker  . Smokeless tobacco: Never Used  . Alcohol use No     Comment: quit 10/05/2009     Allergies   Ibuprofen and Ketoprofen   Review of Systems Review of Systems  Constitutional: Negative for chills, diaphoresis and fever.  HENT: Negative for congestion and sore throat.   Eyes: Negative for photophobia and visual disturbance.  Respiratory: Negative for cough, chest tightness and shortness of breath.   Cardiovascular: Positive for chest pain. Negative for palpitations and leg swelling.    Gastrointestinal: Negative for abdominal pain, nausea and vomiting.  Genitourinary: Negative for dysuria and frequency.  Musculoskeletal: Negative for back pain, neck pain and neck stiffness.  Skin: Negative for rash and wound.  Neurological: Negative for dizziness, light-headedness and headaches.  Psychiatric/Behavioral: Negative for confusion.     Physical Exam Updated Vital Signs BP 119/83   Pulse 72   Temp 98.5 F (36.9 C) (Oral)   Resp 13   Ht 6\' 2"  (1.88 m)   Wt 121.1 kg (267 lb)   SpO2 95%   BMI 34.28 kg/m   Physical Exam  Constitutional: He is oriented to person, place, and time. He appears well-developed and well-nourished. No distress.  HENT:  Head: Normocephalic and atraumatic.  Eyes: Conjunctivae and EOM are normal. Pupils are equal, round, and reactive to light.  Neck: Normal range of motion. Neck supple.  Cardiovascular: Normal rate, regular rhythm, normal heart sounds and intact distal pulses.   Pulmonary/Chest: Effort normal and breath sounds normal. No respiratory distress. He has no wheezes.  Tenderness to palpation of the left pectoral muscle. This reproduces the pain.   Abdominal: Soft. Bowel sounds are normal. He exhibits no distension. There is no tenderness.  Musculoskeletal: Normal range of motion.  Full range of motion of the left arm without pain. Strength of upper extremities intact. Sensation intact bilaterally. Pulses intact bilaterally.  Neurological: He is alert and oriented to person, place, and time.  Skin: Skin is warm and dry. He is not diaphoretic.     ED Treatments / Results  Labs (all labs ordered are listed, but only abnormal results are displayed) Labs Reviewed  BASIC METABOLIC PANEL - Abnormal; Notable for the following:       Result Value   Potassium 3.4 (*)    Chloride 100 (*)    All other components within normal limits  CBC  I-STAT TROPOININ, ED    EKG  EKG Interpretation None       Radiology Dg Chest 2  View  Result Date: 03/31/2017 CLINICAL DATA:  Chest pains. EXAM: CHEST  2 VIEW COMPARISON:  02/12/2017. FINDINGS: Mediastinum and hilar structures normal. Lungs are clear. No pleural effusion or pneumothorax. Mild thoracic spine scoliosis. IMPRESSION: No acute cardiopulmonary disease. Electronically Signed   By: Maisie Fus  Register   On: 03/31/2017 17:09    Procedures Procedures (including critical care time)  Medications Ordered in ED Medications  acetaminophen (TYLENOL) tablet 500 mg (500 mg Oral Given 03/31/17 2206)     Initial Impression / Assessment and Plan / ED Course  I have reviewed the triage vital signs and the nursing notes.  Pertinent labs & imaging results that were available during my care of the patient were reviewed by me and considered in  my medical decision making (see chart for details).     Patient presents with intermittent chest pain lasting several seconds, triggered by movement of the arm or touching the chest wall. Troponin, EKG and CXR reassuring. Physical exam shows pain is reproducible with palpation. Patient denies associated shortness of breath, nausea, vomiting, or dizziness. Pain is likely musculoskeletal. Will give another dose of Tylenol here (pt took 500 mg earlier). Patient told by primary care not to take NSAIDs as it was causing stomach upset. Discussed with patient likely cause of his pain. Patient to continue to use tylenol for pain control, and start taking robaxin. Return precautions given. Pt states he understands and agrees to plan.   Final Clinical Impressions(s) / ED Diagnoses   Final diagnoses:  Precordial pain    New Prescriptions Discharge Medication List as of 03/31/2017 10:01 PM       Alveria ApleyCaccavale, Jazel Nimmons, PA-C 03/31/17 2236    Benjiman CorePickering, Nathan, MD 03/31/17 2300

## 2017-03-31 NOTE — ED Triage Notes (Signed)
Pt report he woke up this am with a heaviness in the center of his chest that has been intermittent.  Pt also states he has has intermittent shortness of breath. Pt is alert , warm and dry, no acute distress.

## 2017-03-31 NOTE — Discharge Instructions (Signed)
Continue to take Tylenol as needed for pain. You may also try the Robaxin that your doctor prescribed you. You may try the shoulder exercises for pain relief. Follow-up with your primary care doctor if you are still experiencing this pain. Return to the emergency department if you develop fever, chills, worsening chest pain, or significant increase in shortness of breath.

## 2017-03-31 NOTE — ED Notes (Addendum)
EKG done at triage but did not transfer over.

## 2017-04-09 ENCOUNTER — Encounter: Payer: Self-pay | Admitting: Internal Medicine

## 2017-04-09 ENCOUNTER — Ambulatory Visit (INDEPENDENT_AMBULATORY_CARE_PROVIDER_SITE_OTHER): Payer: BC Managed Care – PPO | Admitting: Internal Medicine

## 2017-04-09 ENCOUNTER — Other Ambulatory Visit (INDEPENDENT_AMBULATORY_CARE_PROVIDER_SITE_OTHER): Payer: BC Managed Care – PPO

## 2017-04-09 VITALS — BP 124/80 | HR 86 | Ht 74.0 in | Wt 268.0 lb

## 2017-04-09 DIAGNOSIS — R0609 Other forms of dyspnea: Secondary | ICD-10-CM

## 2017-04-09 DIAGNOSIS — I10 Essential (primary) hypertension: Secondary | ICD-10-CM | POA: Diagnosis not present

## 2017-04-09 LAB — CBC WITH DIFFERENTIAL/PLATELET
BASOS PCT: 0.6 % (ref 0.0–3.0)
Basophils Absolute: 0 10*3/uL (ref 0.0–0.1)
EOS PCT: 2.9 % (ref 0.0–5.0)
Eosinophils Absolute: 0.2 10*3/uL (ref 0.0–0.7)
HEMATOCRIT: 44.8 % (ref 39.0–52.0)
HEMOGLOBIN: 14.9 g/dL (ref 13.0–17.0)
LYMPHS PCT: 34.6 % (ref 12.0–46.0)
Lymphs Abs: 1.8 10*3/uL (ref 0.7–4.0)
MCHC: 33.4 g/dL (ref 30.0–36.0)
MCV: 80.1 fl (ref 78.0–100.0)
MONO ABS: 0.5 10*3/uL (ref 0.1–1.0)
MONOS PCT: 9.3 % (ref 3.0–12.0)
Neutro Abs: 2.8 10*3/uL (ref 1.4–7.7)
Neutrophils Relative %: 52.6 % (ref 43.0–77.0)
Platelets: 311 10*3/uL (ref 150.0–400.0)
RBC: 5.59 Mil/uL (ref 4.22–5.81)
RDW: 13.5 % (ref 11.5–15.5)
WBC: 5.3 10*3/uL (ref 4.0–10.5)

## 2017-04-09 MED ORDER — BISOPROLOL FUMARATE 5 MG PO TABS: 5.0000 mg | ORAL_TABLET | Freq: Every day | ORAL | 11 refills | Status: DC

## 2017-04-09 MED ORDER — OMEPRAZOLE 20 MG PO CPDR: DELAYED_RELEASE_CAPSULE | ORAL | 11 refills | Status: DC

## 2017-04-09 NOTE — Patient Instructions (Addendum)
Stop inderol (proranolol)  Start bisoprolol 5 mg one daily in its place  Only use your albuterol as a rescue medication to be used if you can't catch your breath by resting or doing a relaxed purse lip breathing pattern.  - The less you use it, the better it will work when you need it. - Ok to use up to 2 puffs  every 4 hours if you must but call for immediate appointment if use goes up over your usual need - Don't leave home without it !!  (think of it like the spare tire for your car)    Omperazole 40mg   Take 30-60 min before first meal of the day   GERD (REFLUX)  is an extremely common cause of respiratory symptoms just like yours , many times with no obvious heartburn at all.    It can be treated with medication, but also with lifestyle changes including elevation of the head of your bed (ideally with 6 inch  bed blocks),  Smoking cessation, avoidance of late meals, excessive alcohol, and avoid fatty foods, chocolate, peppermint, colas, red wine, and acidic juices such as orange juice.  NO MINT OR MENTHOL PRODUCTS SO NO COUGH DROPS   USE SUGARLESS CANDY INSTEAD (Jolley ranchers or Stover's or Life Savers) or even ice chips will also do - the key is to swallow to prevent all throat clearing. NO OIL BASED VITAMINS - use powdered substitutes.  If not happy by around the 1st August you need to call [facility] [phone] and ask to schedule a cpst

## 2017-04-09 NOTE — Progress Notes (Signed)
Subjective:     Patient ID: Dylan Guerrero, male   DOB: April 27, 1976,     MRN: [id number]  HPI  40 yobm never smoker healthy as teenager including soccer but stopped aerobics p 10 th grade and started gaining wt and noted gradual doe but could still do jog  mile thru sept 2017 then  much worse feb 2018 assoc with globus but neg ent w/u and neg response to gerd rx so referred to pulmonary clinic 04/09/2017 by Dr Mikeal HawthorneGarba     04/09/2017 1st  Pulmonary office visit/ Reann Dobias   Chief Complaint  Patient presents with  . Pulmonary Consult    Referred by Dr. Gwenyth BouillonMohammad.  Pt states having SOB and feeling light headed for the past 5 months. He gets SOB walking or even just standing.  He states he has CP occ when he takes a deep breath or when he moves his left side. He gets light headed every time he gets SOB. He has an albuterol inhaler that he uses 4 x per wk on average.   spells of sob up to 5 min at rest / no better with inhaler / happens avg  once a day s pattern but assoc with loss of voice and chest discomfort  Not typically at hs while on cpap Ex tol down to a block nl pace walk  tilley eval neg for cards feb 2018  Takes omeprazole 20 mg x 30 min a supper but most of his symptoms are before supper  No better p saba  clariton x 2  Years no better  No obvious day to day or daytime variability or assoc excess/ purulent sputum or mucus plugs or hemoptysis or cp or chest tightness, subjective wheeze or overt sinus or hb symptoms. No unusual exp hx or h/o childhood pna/ asthma or knowledge of premature birth.  Sleeping ok without nocturnal  or early am exacerbation  of respiratory  c/o's or need for noct saba. Also denies any obvious fluctuation of symptoms with weather or environmental changes or other aggravating or alleviating factors except as outlined above   Current Medications, Allergies, Complete Past Medical History, Past Surgical History, Family History, and Social History were reviewed in  [facility] electronic medical record.  ROS  The following are not active complaints unless bolded sore throat, dysphagia, dental problems, itching, sneezing,  nasal congestion or excess/ purulent secretions, ear ache,   fever, chills, sweats, unintended wt loss, classically pleuritic or exertional cp,  orthopnea pnd or leg swelling, presyncope, palpitations, abdominal pain, anorexia, nausea, vomiting, diarrhea  or change in bowel or bladder habits, change in stools or urine, dysuria,hematuria,  rash, arthralgias, visual complaints, headache, numbness, weakness or ataxia or problems with walking or coordination,  change in mood/affect or memory.          Review of Systems     Objective:   Physical Exam  amb obes bm nad   Wt Readings from Last 3 Encounters:  04/09/17 268 lb (121.6 kg)  03/31/17 267 lb (121.1 kg)  12/22/16 265 lb (120.2 kg)    Vital signs reviewed  - Note on arrival 02 sats  97% on RA      HEENT: nl dentition, turbinates bilaterally, and oropharynx. Nl external ear canals without cough reflex   NECK :  without JVD/Nodes/TM/ nl carotid upstrokes bilaterally   LUNGS: no acc muscle use,  Nl contour chest which is clear to A and P bilaterally without cough on insp or exp maneuvers  CV:  RRR  no s3 or murmur or increase in P2, and no edema   ABD:  soft and nontender with nl inspiratory excursion in the supine position. No bruits or organomegaly appreciated, bowel sounds nl  MS:  Nl gait/ ext warm without deformities, calf tenderness, cyanosis or clubbing No obvious joint restrictions   SKIN: warm and dry without lesions    NEURO:  alert, approp, nl sensorium with  no motor or cerebellar deficits apparent.     I personally reviewed images and agree with radiology impression as follows:  CXR:   03/31/17 No acute cardiopulmonary disease.    Labs ordered 04/09/2017    Allergy profile       Assessment:

## 2017-04-11 DIAGNOSIS — I10 Essential (primary) hypertension: Secondary | ICD-10-CM | POA: Insufficient documentation

## 2017-04-11 NOTE — Assessment & Plan Note (Signed)
Body mass index is 34.41 kg/m.  -  trending up slowly  No results found for: TSH   Contributing to gerd risk/ doe/reviewed the need and the process to achieve and maintain neg calorie balance > defer f/u primary care including intermittently monitoring thyroid status

## 2017-04-11 NOTE — Assessment & Plan Note (Addendum)
Spirometry 04/09/2017  FEV1 3.33 (82%)  Ratio 85   Allergy profile 04/09/2017 >  Eos 0.2 /  IgE  Pending  - 04/09/2017  Walked RA x 3 laps @ 185 ft each stopped due to  End of study, nl pace, no sob or desat but throat tightness > rec ppi qam ac x 6 weeks then cpst as already had neg cards w/u per [name]   Symptoms are markedly disproportionate to objective findings and not clear this is actually much of a  lung problem but pt does appear to have difficult to sort out respiratory symptoms of unknown origin for which  DDX  = almost all start with A and  include Adherence, Ace Inhibitors, Acid Reflux, Active Sinus Disease, Alpha 1 Antitripsin deficiency, Anxiety masquerading as Airways dz,  ABPA,  Allergy(esp in young), Aspiration (esp in elderly), Adverse effects of meds,  Active smokers, A bunch of PE's (a small clot burden can't cause this syndrome unless there is already severe underlying pulm or vascular dz with poor reserve) plus two Bs  = Bronchiectasis and Beta blocker use..and one C= CHF     Adherence is always the initial "prime suspect" and is a multilayered concern that requires a "trust but verify" approach in every patient - starting with knowing how to use medications, especially inhalers, correctly, keeping up with refills and understanding the fundamental difference between maintenance and prns vs those medications only taken for a very short course and then stopped and not refilled.  - note not taking ppi as advised   ? Acid (or non-acid) GERD > always difficult to exclude as up to 75% of pts in some series report no assoc GI/ Heartburn symptoms> rec max (24h)  acid suppression and diet restrictions/ reviewed and instructions given in writing.   ? Anxiety > usually at the bottom of this list of usual suspects but should be much higher on this pt's based on H and P esp since symptoms come on at rest and resolve s specific rx  - the ones that come on reproducibly with ex but could not be  reproduced here need to be explored with cpst next step  ? Allergy/ asthma > neg resp to saba and absence of rhinitis or sleep symptoms both make this less likely > check allergy profile   ? Beta blocker effects > in theory this could be why when he has "spells" he's not resp to saba though not why he gets better without saba but in any case reasonable to d/c propranolol in this setting (see separate a/p)   ? CHF >  Neg cards eval noted    Total time devoted to counseling  > 50 % of initial 60 min office visit:  review case with pt/ discussion of options/alternatives/ personally creating written customized instructions  in presence of pt  then going over those specific  Instructions directly with the pt including how to use all of the meds but in particular covering each new medication in detail and the difference between the maintenance= "automatic" meds and the prns using an action plan format for the latter (If this problem/symptom => do that organization reading Left to right).  Please see AVS from this visit for a full list of these instructions which I personally wrote for this pt and  are unique to this visit.

## 2017-04-11 NOTE — Assessment & Plan Note (Signed)
D/c propranolol 04/09/2017 > change to bisoprolol due to concern ? Asthma   Strongly prefer in this setting: Bystolic, the most beta -1  selective Beta blocker available in sample form, with bisoprolol the most selective generic choice  on the market.   Try bisoprolol 5 mg daily > f/u with cpst next if not better

## 2017-04-12 ENCOUNTER — Telehealth: Payer: Self-pay | Admitting: Internal Medicine

## 2017-04-12 LAB — RESPIRATORY ALLERGY PROFILE REGION II ~~LOC~~
ALLERGEN, COTTONWOOD, T14: 0.18 kU/L — AB
ALLERGEN, D PTERNOYSSINUS, D1: 0.93 kU/L — AB
Allergen, A. alternata, m6: <0.1 kU/L
Allergen, Cedar tree, t12: <0.1 kU/L
Allergen, Comm Silver Birch, t9: <0.1 kU/L
Allergen, Mouse Urine Protein, e78: <0.1 kU/L
Allergen, Oak,t7: <0.1 kU/L
Allergen, P. notatum, m1: <0.1 kU/L
Aspergillus fumigatus, m3: <0.1 kU/L
Box Elder IgE: <0.1 kU/L
COCKROACH: 0.1 kU/L — AB
Common Ragweed: <0.1 kU/L
D. FARINAE: 0.73 kU/L — AB
ELM IGE: 0.1 kU/L — AB
IgE (Immunoglobulin E), Serum: 282 kU/L — ABNORMAL HIGH (ref <115)
Pecan/Hickory Tree IgE: 0.17 kU/L — ABNORMAL HIGH
Rough Pigweed  IgE: <0.1 kU/L
Sheep Sorrel IgE: <0.1 kU/L
Timothy Grass: <0.1 kU/L

## 2017-04-12 MED ORDER — OMEPRAZOLE 40 MG PO CPDR: 40.0000 mg | DELAYED_RELEASE_CAPSULE | Freq: Every day | ORAL | 11 refills | Status: DC

## 2017-04-12 NOTE — Progress Notes (Signed)
ATC, NA and no option to leave msg 

## 2017-04-12 NOTE — Telephone Encounter (Signed)
Attempted to call patient but VM was full at time of call. Looked at AVS from last visit and it does appear MW wanted him to be on omprazole 40mg  instead of the 20mg  that was called in.   RX for 40mg  has been sent in to his pharmacy.

## 2017-05-06 DIAGNOSIS — Z0289 Encounter for other administrative examinations: Secondary | ICD-10-CM

## 2017-05-11 ENCOUNTER — Telehealth: Payer: Self-pay | Admitting: Internal Medicine

## 2017-05-11 DIAGNOSIS — R0609 Other forms of dyspnea: Principal | ICD-10-CM

## 2017-05-11 NOTE — Telephone Encounter (Signed)
Spoke with the pt  He wanted to go ahead and have CPST like MW had rec b/c he is not improving  I ordered the test and cancelled ov with MW for this wk per his request so that we can see him after the test

## 2017-05-13 ENCOUNTER — Ambulatory Visit (INDEPENDENT_AMBULATORY_CARE_PROVIDER_SITE_OTHER): Payer: BC Managed Care – PPO | Admitting: Diagnostic Neuroimaging

## 2017-05-13 ENCOUNTER — Encounter: Payer: Self-pay | Admitting: Diagnostic Neuroimaging

## 2017-05-13 ENCOUNTER — Ambulatory Visit: Payer: BC Managed Care – PPO | Admitting: Internal Medicine

## 2017-05-13 VITALS — BP 130/80 | HR 84 | Wt 267.4 lb

## 2017-05-13 DIAGNOSIS — G441 Vascular headache, not elsewhere classified: Secondary | ICD-10-CM | POA: Diagnosis not present

## 2017-05-13 DIAGNOSIS — G4733 Obstructive sleep apnea (adult) (pediatric): Secondary | ICD-10-CM

## 2017-05-13 DIAGNOSIS — R42 Dizziness and giddiness: Secondary | ICD-10-CM

## 2017-05-13 DIAGNOSIS — G43109 Migraine with aura, not intractable, without status migrainosus: Secondary | ICD-10-CM

## 2017-05-13 MED ORDER — TOPIRAMATE 50 MG PO TABS: 50.0000 mg | ORAL_TABLET | Freq: Two times a day (BID) | ORAL | 12 refills | Status: DC

## 2017-05-13 NOTE — Patient Instructions (Signed)
Thank you for coming to see [place] at [facility]. I hope we have been able to provide you high quality care today.  You may receive a patient satisfaction survey over the next few weeks. We would appreciate your feedback and comments so that we may continue to improve ourselves and the health of our patients.   - start topiramate 81m at bedtime; after 1 week increase to twice a day; drink plenty of water  - I will check MRI brain; if normal, then will check lumbar puncture to measure pressure     ~~~~~~~~~~~~~~~~~~~~~~~~~~~~~~~~~~~~~~~~~~~~~~~~~~~~~~~~~~~~~~~~~  DR. PENUMALLI'S GUIDE TO HAPPY AND HEALTHY LIVING These are some of my general health and wellness recommendations. Some of them may apply to you better than others. Please use common sense as you try these suggestions and feel free to ask me any questions.   ACTIVITY/FITNESS Mental, social, emotional and physical stimulation are very important for brain and body health. Try learning a new activity (arts, music, language, sports, games).  Keep moving your body to the best of your abilities. You can do this at home, inside or outside, the park, community center, gym or anywhere you like. Consider a physical therapist or personal trainer to get started. Consider the app Sworkit. Fitness trackers such as smart-watches, smart-phones or Fitbits can help as well.   NUTRITION Eat more plants: colorful vegetables, nuts, seeds and berries.  Eat less sugar, salt, preservatives and processed foods.  Avoid toxins such as cigarettes and alcohol.  Drink water when you are thirsty. Warm water with a slice of lemon is an excellent morning drink to start the day.  Consider these websites for more information The Nutrition Source (hhttps://www.henry-hernandez.biz/ Precision Nutrition (wWindowBlog.ch   RELAXATION Consider practicing mindfulness meditation or other relaxation  techniques such as deep breathing, prayer, yoga, tai chi, massage. See website mindful.org or the apps Headspace or Calm to help get started.   SLEEP Try to get at least 7-8+ hours sleep per day. Regular exercise and reduced caffeine will help you sleep better. Practice good sleep hygeine techniques. See website sleep.org for more information.   PLANNING Prepare estate planning, living will, healthcare POA documents. Sometimes this is best planned with the help of an attorney. Theconversationproject.org and agingwithdignity.org are excellent resources.
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
## 2017-05-26 ENCOUNTER — Ambulatory Visit: Payer: BC Managed Care – PPO

## 2017-05-26 ENCOUNTER — Telehealth: Payer: Self-pay | Admitting: Diagnostic Neuroimaging

## 2017-05-26 NOTE — Telephone Encounter (Signed)
Patient was scheduled to have MRI today at the [facility] mobile unit. But he was unable to do it. He was too claustrophic and states he needs an open mri and like to take something to calm his nerves. I faxed order to [facility] for the open mri.

## 2017-05-27 ENCOUNTER — Emergency Department (HOSPITAL_COMMUNITY)
Admission: EM | Admit: 2017-05-27 | Discharge: 2017-05-28 | Disposition: A | Discharge status: Home / Self Care | Payer: BC Managed Care – PPO | Attending: Emergency Medicine | Admitting: Emergency Medicine

## 2017-05-27 ENCOUNTER — Emergency Department (HOSPITAL_COMMUNITY): Payer: BC Managed Care – PPO

## 2017-05-27 ENCOUNTER — Encounter (HOSPITAL_COMMUNITY): Payer: Self-pay

## 2017-05-27 ENCOUNTER — Encounter (HOSPITAL_COMMUNITY): Payer: BC Managed Care – PPO

## 2017-05-27 DIAGNOSIS — R200 Anesthesia of skin: Secondary | ICD-10-CM | POA: Diagnosis present

## 2017-05-27 DIAGNOSIS — Z79899 Other long term (current) drug therapy: Secondary | ICD-10-CM | POA: Diagnosis not present

## 2017-05-27 DIAGNOSIS — R202 Paresthesia of skin: Secondary | ICD-10-CM | POA: Insufficient documentation

## 2017-05-27 DIAGNOSIS — I10 Essential (primary) hypertension: Secondary | ICD-10-CM | POA: Insufficient documentation

## 2017-05-27 LAB — CBC
HEMATOCRIT: 43.5 % (ref 39.0–52.0)
Hemoglobin: 15.3 g/dL (ref 13.0–17.0)
MCH: 26.9 pg (ref 26.0–34.0)
MCHC: 35.2 g/dL (ref 30.0–36.0)
MCV: 76.6 fL — AB (ref 78.0–100.0)
Platelets: 313 10*3/uL (ref 150–400)
RBC: 5.68 MIL/uL (ref 4.22–5.81)
RDW: 13.5 % (ref 11.5–15.5)
WBC: 6.3 10*3/uL (ref 4.0–10.5)

## 2017-05-27 LAB — BASIC METABOLIC PANEL
Anion gap: 11 (ref 5–15)
BUN: 10 mg/dL (ref 6–20)
CHLORIDE: 103 mmol/L (ref 101–111)
CO2: 25 mmol/L (ref 22–32)
Calcium: 9.8 mg/dL (ref 8.9–10.3)
Creatinine, Ser: 1.3 mg/dL — ABNORMAL HIGH (ref 0.61–1.24)
GFR calc non Af Amer: >60 mL/min (ref >60)
Glucose, Bld: 99 mg/dL (ref 65–99)
POTASSIUM: 3.3 mmol/L — AB (ref 3.5–5.1)
SODIUM: 139 mmol/L (ref 135–145)

## 2017-05-27 MED ORDER — ALPRAZOLAM 0.5 MG PO TABS: 0.5000 mg | ORAL_TABLET | ORAL | 0 refills | Status: DC | PRN

## 2017-05-27 NOTE — Telephone Encounter (Signed)
Ok for xanax. rx sent in. -VRP

## 2017-05-27 NOTE — ED Notes (Signed)
Pt not in room, informed d/c by someone else

## 2017-05-27 NOTE — ED Triage Notes (Addendum)
Pt presents for evaluation of bilateral hand numbness and R arm and leg numbness since 0800 this AM when he woke up. LKW last PM.  Pt passed stroke screen for EMS. Alert and oriented x 4. No focal deficits beyond numbness noted in triage.

## 2017-05-27 NOTE — Discharge Instructions (Signed)
Follow up with your neurologist for further evaluation, the CT Scans of your head and neck in the ED were reassuring

## 2017-05-27 NOTE — Telephone Encounter (Signed)
Rx for Xanax for MRI faxed to [facility], [address]

## 2017-05-27 NOTE — ED Provider Notes (Signed)
[facility]-EMERGENCY DEPT Provider Note   CSN: [id number] Arrival date & time: 05/27/17  1153     History   Chief Complaint Chief Complaint  Patient presents with  . Numbness    HPI Dylan Guerrero is a 41 y.o. male.  HPI Pt noticed off and on numbness and weakness in the face and both hands for a few weeks.  Sx would last maybe minutes.  Nothing trigerred it.  This am it has been constant though.  Right leg feels stiff when walking.Marland Kitchen  He feels like his speech is off a little bit. Past Medical History:  Diagnosis Date  . Abdominal pain   . Allergy   . Arthritis   . Carpal tunnel syndrome   . GERD (gastroesophageal reflux disease)   . Headache(784.0)   . Hernia   . Hypertension   . Hypokalemia   . Infection of the inner ear   . Migraine   . Sinus infection   . Sleep apnea   . Spider bite     Patient Active Problem List   Diagnosis Date Noted  . Morbid obesity due to excess calories (HCC) 04/11/2017  . Essential hypertension 04/11/2017  . DOE (dyspnea on exertion) 04/09/2017  . Migraine with aura 01/09/2013  . Umbilical pain 10/22/2011    Past Surgical History:  Procedure Laterality Date  . APPENDECTOMY  1984  . HERNIA REPAIR  01/30/2011   Right Inguinal Hernia repair with mesh  . HERNIA REPAIR  01/30/2011   Umbilical Hernia repair with meash       Home Medications    Prior to Admission medications   Medication Sig Start Date End Date Taking? Authorizing Provider  acetaminophen (TYLENOL) 500 MG tablet Take 500 mg by mouth every 6 (six) hours as needed (pain).    [provider]  albuterol (PROVENTIL HFA;VENTOLIN HFA) 108 (90 Base) MCG/ACT inhaler Inhale 2 puffs into the lungs every 6 (six) hours as needed for wheezing or shortness of breath.    [provider]  ALPRAZolam Prudy Feeler) 0.5 MG tablet Take 1 tablet (0.5 mg total) by mouth as needed for anxiety (for sedation before MRI scan; take 1 hour before scan; may repeat 15 min before scan).  05/27/17   Penumalli, Glenford Bayley, MD  amLODipine (NORVASC) 10 MG tablet Take 10 mg by mouth daily.    [provider]  bisoprolol (ZEBETA) 5 MG tablet Take 1 tablet (5 mg total) by mouth daily. 04/09/17   Nyoka Cowden, MD  Ergocalciferol (VITAMIN D2 PO) Take 1.25 mg by mouth every 7 (seven) days.    [provider]  hydrochlorothiazide (HYDRODIURIL) 25 MG tablet Take 25 mg by mouth daily.    [provider]  meclizine (ANTIVERT) 25 MG tablet Take 25 mg by mouth 3 (three) times daily as needed for dizziness.    [provider]  methocarbamol (ROBAXIN) 750 MG tablet Take 750 mg by mouth every 8 (eight) hours as needed for muscle spasms.    [provider]  Multiple Vitamin (MULTIVITAMIN WITH MINERALS) TABS tablet Take 1 tablet by mouth daily.    [provider]  omeprazole (PRILOSEC) 40 MG capsule Take 1 capsule (40 mg total) by mouth daily. 04/12/17   Nyoka Cowden, MD  topiramate (TOPAMAX) 50 MG tablet Take 1 tablet (50 mg total) by mouth 2 (two) times daily. 05/13/17   Penumalli, Glenford Bayley, MD  UNABLE TO FIND Med Name: CPAP  [facility]    [provider]  Family History Family History  Problem Relation Age of Onset  . Scleroderma Mother   . Diabetes Unknown   . Hypertension Unknown     Social History Social History  Substance Use Topics  . Smoking status: Never Smoker  . Smokeless tobacco: Never Used  . Alcohol use No     Comment: quit 10/05/2009     Allergies   Ibuprofen and Ketoprofen   Review of Systems Review of Systems  All other systems reviewed and are negative.    Physical Exam Updated Vital Signs BP 130/81 (BP Location: Right Arm)   Pulse 66   Temp 98.2 F (36.8 C) (Oral)   Resp 17   Ht 1.88 m (6\' 2" )   Wt 120.2 kg (265 lb)   SpO2 100%   BMI 34.02 kg/m   Physical Exam  Constitutional: He is oriented to person, place, and time. He appears well-developed and well-nourished. No distress.  HENT:    Head: Normocephalic and atraumatic.  Right Ear: External ear normal.  Left Ear: External ear normal.  Mouth/Throat: Oropharynx is clear and moist.  Eyes: Conjunctivae are normal. Right eye exhibits no discharge. Left eye exhibits no discharge. No scleral icterus.  Neck: Neck supple. No tracheal deviation present.  Cardiovascular: Normal rate, regular rhythm and intact distal pulses.   Pulmonary/Chest: Effort normal and breath sounds normal. No stridor. No respiratory distress. He has no wheezes. He has no rales.  Abdominal: Soft. Bowel sounds are normal. He exhibits no distension. There is no tenderness. There is no rebound and no guarding.  Musculoskeletal: He exhibits no edema or tenderness.  Neurological: He is alert and oriented to person, place, and time. He has normal strength. No cranial nerve deficit (No facial droop, extraocular movements intact, tongue midline ) or sensory deficit. He exhibits normal muscle tone. He displays no seizure activity. Coordination normal.  No pronator drift bilateral upper extrem, able to hold both legs off bed for 5 seconds, sensation intact in all extremities, no visual field cuts, no left or right sided neglect, normal finger-nose exam bilaterally, no nystagmus noted   Skin: Skin is warm and dry. No rash noted.  Psychiatric: He has a normal mood and affect.  Nursing note and vitals reviewed.    ED Treatments / Results  Labs (all labs ordered are listed, but only abnormal results are displayed) Labs Reviewed  BASIC METABOLIC PANEL - Abnormal; Notable for the following:       Result Value   Potassium 3.3 (*)    Creatinine, Ser 1.30 (*)    All other components within normal limits  CBC - Abnormal; Notable for the following:    MCV 76.6 (*)    All other components within normal limits     Radiology Ct Head Wo Contrast  Result Date: 05/27/2017 CLINICAL DATA:  Bilateral hand numbness and right arm and leg numbness. EXAM: CT HEAD WITHOUT  CONTRAST CT CERVICAL SPINE WITHOUT CONTRAST TECHNIQUE: Multidetector CT imaging of the head and cervical spine was performed following the standard protocol without intravenous contrast. Multiplanar CT image reconstructions of the cervical spine were also generated. COMPARISON:  CT neck dated Feb 12, 2017; MRI cervical spine dated June 16, 2016. FINDINGS: CT HEAD FINDINGS Brain: No evidence of acute infarction, hemorrhage, hydrocephalus, extra-axial collection or mass lesion/mass effect. Vascular: No hyperdense vessel or unexpected calcification. Skull: Normal. Negative for fracture or focal lesion. Sinuses/Orbits: The bilateral paranasal sinuses and mastoid air cells are clear. The orbits are unremarkable. Other: None.  CT CERVICAL SPINE FINDINGS Alignment: Normal. Skull base and vertebrae: No acute fracture. No primary bone lesion or focal pathologic process. Soft tissues and spinal canal: No prevertebral fluid or swelling. No visible canal hematoma. Disc levels:  Normal.  No spinal canal or neuroforaminal stenosis. Upper chest: Negative. Other: None. IMPRESSION: 1. Normal noncontrast head CT. 2. Normal noncontrast cervical spine CT. Electronically Signed   By: Obie Dredge M.D.   On: 05/27/2017 18:05   Ct Cervical Spine Wo Contrast  Result Date: 05/27/2017 CLINICAL DATA:  Bilateral hand numbness and right arm and leg numbness. EXAM: CT HEAD WITHOUT CONTRAST CT CERVICAL SPINE WITHOUT CONTRAST TECHNIQUE: Multidetector CT imaging of the head and cervical spine was performed following the standard protocol without intravenous contrast. Multiplanar CT image reconstructions of the cervical spine were also generated. COMPARISON:  CT neck dated Feb 12, 2017; MRI cervical spine dated June 16, 2016. FINDINGS: CT HEAD FINDINGS Brain: No evidence of acute infarction, hemorrhage, hydrocephalus, extra-axial collection or mass lesion/mass effect. Vascular: No hyperdense vessel or unexpected calcification. Skull:  Normal. Negative for fracture or focal lesion. Sinuses/Orbits: The bilateral paranasal sinuses and mastoid air cells are clear. The orbits are unremarkable. Other: None. CT CERVICAL SPINE FINDINGS Alignment: Normal. Skull base and vertebrae: No acute fracture. No primary bone lesion or focal pathologic process. Soft tissues and spinal canal: No prevertebral fluid or swelling. No visible canal hematoma. Disc levels:  Normal.  No spinal canal or neuroforaminal stenosis. Upper chest: Negative. Other: None. IMPRESSION: 1. Normal noncontrast head CT. 2. Normal noncontrast cervical spine CT. Electronically Signed   By: Obie Dredge M.D.   On: 05/27/2017 18:05    Procedures Procedures (including critical care time)  Medications Ordered in ED Medications - No data to display   Initial Impression / Assessment and Plan / ED Course  I have reviewed the triage vital signs and the nursing notes.  Pertinent labs & imaging results that were available during my care of the patient were reviewed by me and considered in my medical decision making (see chart for details).   Pt presents with paresthesias.  No focal weakness on my exam.  CT scans are reassuring.  Doubt MS but a consideration if the sx persist.  Discussed outpatient follow up with his neurologist.  Final Clinical Impressions(s) / ED Diagnoses   Final diagnoses:  Paresthesia    New Prescriptions New Prescriptions   No medications on file     Linwood Dibbles, MD 05/27/17 [phone]

## 2017-05-28 ENCOUNTER — Telehealth (HOSPITAL_COMMUNITY): Payer: Self-pay | Admitting: *Deleted

## 2017-05-28 NOTE — Telephone Encounter (Signed)
Returned patient call regarding missed CPX appointment yesterday (8/23), due to visit to [facility]. No answer, left patient a voicemail with instructions to return call to reschedule CPX appointment after completion of MRI per neurologist.    Lesia Hausen, MS, ACSM-RCEP 05/28/2017 12:23 PM

## 2017-06-08 ENCOUNTER — Telehealth: Payer: Self-pay | Admitting: *Deleted

## 2017-06-08 NOTE — Telephone Encounter (Signed)
LVM informing patient that Dr Marjory LiesPenumalli reviewed his MRI brain report and result was normal. Left number for any questions.

## 2017-06-14 ENCOUNTER — Telehealth: Payer: Self-pay | Admitting: Diagnostic Neuroimaging

## 2017-06-14 NOTE — Telephone Encounter (Signed)
I spoke to to pt and he was at urgent care as we spoke to be evaluated.  He has been on the topamax for one month.  I relayed that topamax can increase propensity for calcium phosphorus stone.  He will let us know how is appt goes at urgent care.

## 2017-06-14 NOTE — Telephone Encounter (Signed)
Patient calling stating he thinks topiramate (TOPAMAX) 50 MG tablet has caused kidney stones. Please call l and discuss.

## 2017-06-14 NOTE — Telephone Encounter (Signed)
Ok to stop topiramate. Have patient come in to see me or Megan for follow up visit. May consider other migraine prevention medication. -VRP

## 2017-06-14 NOTE — Telephone Encounter (Signed)
Pt called in he was advised by Dr Bhupinder/[facility] to stop the topamax and to try a different source.  Please call to advise.

## 2017-06-16 NOTE — Telephone Encounter (Signed)
Spoke to pt and relayed that ok to stop topamax and see for another option.  Made appt for 06-23-17 with MM/NP at 0930.  Pt states urgent care told to stop, was having sever constipation and abd cramps.

## 2017-06-19 ENCOUNTER — Emergency Department (HOSPITAL_COMMUNITY)
Admission: EM | Admit: 2017-06-19 | Discharge: 2017-06-19 | Disposition: A | Discharge status: Home / Self Care | Payer: BC Managed Care – PPO | Attending: Emergency Medicine | Admitting: Emergency Medicine

## 2017-06-19 ENCOUNTER — Encounter (HOSPITAL_COMMUNITY): Payer: Self-pay | Admitting: Nurse Practitioner

## 2017-06-19 DIAGNOSIS — R202 Paresthesia of skin: Secondary | ICD-10-CM | POA: Diagnosis present

## 2017-06-19 DIAGNOSIS — E876 Hypokalemia: Secondary | ICD-10-CM

## 2017-06-19 DIAGNOSIS — I10 Essential (primary) hypertension: Secondary | ICD-10-CM | POA: Diagnosis not present

## 2017-06-19 DIAGNOSIS — Z79899 Other long term (current) drug therapy: Secondary | ICD-10-CM | POA: Diagnosis not present

## 2017-06-19 DIAGNOSIS — R42 Dizziness and giddiness: Secondary | ICD-10-CM

## 2017-06-19 DIAGNOSIS — R1012 Left upper quadrant pain: Secondary | ICD-10-CM | POA: Diagnosis not present

## 2017-06-19 LAB — COMPREHENSIVE METABOLIC PANEL
ALT: 21 U/L (ref 17–63)
AST: 24 U/L (ref 15–41)
Albumin: 4.3 g/dL (ref 3.5–5.0)
Alkaline Phosphatase: 103 U/L (ref 38–126)
Anion gap: 11 (ref 5–15)
BUN: 9 mg/dL (ref 6–20)
CO2: 26 mmol/L (ref 22–32)
Calcium: 9.3 mg/dL (ref 8.9–10.3)
Chloride: 101 mmol/L (ref 101–111)
Creatinine, Ser: 1.23 mg/dL (ref 0.61–1.24)
GFR calc Af Amer: >60 mL/min (ref >60)
GFR calc non Af Amer: >60 mL/min (ref >60)
Glucose, Bld: 103 mg/dL — ABNORMAL HIGH (ref 65–99)
Potassium: 3.1 mmol/L — ABNORMAL LOW (ref 3.5–5.1)
Sodium: 138 mmol/L (ref 135–145)
Total Bilirubin: 0.6 mg/dL (ref 0.3–1.2)
Total Protein: 8 g/dL (ref 6.5–8.1)

## 2017-06-19 LAB — CBC
HEMATOCRIT: 42.3 % (ref 39.0–52.0)
Hemoglobin: 14.5 g/dL (ref 13.0–17.0)
MCH: 26.6 pg (ref 26.0–34.0)
MCHC: 34.3 g/dL (ref 30.0–36.0)
MCV: 77.6 fL — ABNORMAL LOW (ref 78.0–100.0)
Platelets: 307 10*3/uL (ref 150–400)
RBC: 5.45 MIL/uL (ref 4.22–5.81)
RDW: 14 % (ref 11.5–15.5)
WBC: 6.2 10*3/uL (ref 4.0–10.5)

## 2017-06-19 MED ORDER — NAPROXEN 375 MG PO TABS
375.0000 mg | ORAL_TABLET | Freq: Once | ORAL | Status: AC
  Administered 2017-06-19: 375 mg via ORAL
  Filled 2017-06-19: qty 1

## 2017-06-19 NOTE — ED Provider Notes (Signed)
[facility]-EMERGENCY DEPT Provider Note   CSN: [id number] Arrival date & time: 06/19/17  1516   History   Chief Complaint Chief Complaint  Patient presents with  . Tingling    HPI Dylan Guerrero is a 41 y.o. male.  HPI   41 year old male presents today with numerous Chronic complaints. Patient notes that he's had abdominal pain that has been coming and going over the left upper quadrant. Patient notes he feels that there is a knot in there, but does not actually feel it. He's been seen and evaluated for this with no acute findings. Patient also notes he had an episode of shortness of breath that lasted approximately 2 minutes and then resolved without complication. Patient notes he also has dizziness that has been consistent, reports it feels as if he is on a boat. He's been diagnosed with vertigo and is taking meclizine for this. He notes symptoms are worse with signs of head movements. Patient also notes tingling in his fingers and lips that is still present, denies any weakness or numbness. Patient denies any fever, nausea vomiting, chest pain, diarrhea.    Past Medical History:  Diagnosis Date  . Abdominal pain   . Allergy   . Arthritis   . Carpal tunnel syndrome   . GERD (gastroesophageal reflux disease)   . Headache(784.0)   . Hernia   . Hypertension   . Hypokalemia   . Infection of the inner ear   . Migraine   . Sinus infection   . Sleep apnea   . Spider bite     Patient Active Problem List   Diagnosis Date Noted  . Morbid obesity due to excess calories (HCC) 04/11/2017  . Essential hypertension 04/11/2017  . DOE (dyspnea on exertion) 04/09/2017  . Migraine with aura 01/09/2013  . Umbilical pain 10/22/2011    Past Surgical History:  Procedure Laterality Date  . APPENDECTOMY  1984  . HERNIA REPAIR  01/30/2011   Right Inguinal Hernia repair with mesh  . HERNIA REPAIR  01/30/2011   Umbilical Hernia repair with meash       Home Medications    Prior to  Admission medications   Medication Sig Start Date End Date Taking? Authorizing Provider  acetaminophen (TYLENOL) 500 MG tablet Take 500 mg by mouth every 6 (six) hours as needed (pain).   Yes [provider]  albuterol (PROVENTIL HFA;VENTOLIN HFA) 108 (90 Base) MCG/ACT inhaler Inhale 2 puffs into the lungs every 6 (six) hours as needed for wheezing or shortness of breath.   Yes [provider]  amLODipine (NORVASC) 10 MG tablet Take 10 mg by mouth daily.   Yes [provider]  bisoprolol (ZEBETA) 5 MG tablet Take 1 tablet (5 mg total) by mouth daily. 04/09/17  Yes Nyoka Cowden, MD  hydrochlorothiazide (HYDRODIURIL) 25 MG tablet Take 25 mg by mouth daily.   Yes [provider]  meclizine (ANTIVERT) 25 MG tablet Take 25 mg by mouth 3 (three) times daily as needed for dizziness.   Yes [provider]  methocarbamol (ROBAXIN) 750 MG tablet Take 750 mg by mouth every 8 (eight) hours as needed for muscle spasms.   Yes [provider]  Multiple Vitamin (MULTIVITAMIN WITH MINERALS) TABS tablet Take 1 tablet by mouth daily.   Yes [provider]  omeprazole (PRILOSEC) 40 MG capsule Take 1 capsule (40 mg total) by mouth daily. 04/12/17  Yes Nyoka Cowden, MD  psyllium (METAMUCIL SMOOTH TEXTURE) 28 % packet Take  1 packet by mouth daily.   Yes [provider]  UNABLE TO FIND Med Name: CPAP  [facility]   Yes [provider]  Vitamin D, Ergocalciferol, (DRISDOL) 50000 units CAPS capsule Take 50,000 Units by mouth every 7 (seven) days.   Yes [provider]  ALPRAZolam Prudy Feeler) 0.5 MG tablet Take 1 tablet (0.5 mg total) by mouth as needed for anxiety (for sedation before MRI scan; take 1 hour before scan; may repeat 15 min before scan). Patient not taking: Reported on 06/19/2017 05/27/17   Penumalli, Glenford Bayley, MD  topiramate (TOPAMAX) 50 MG tablet Take 1 tablet (50 mg total) by mouth 2 (two) times daily. Patient not taking:  Reported on 06/19/2017 05/13/17   Suanne Marker, MD    Family History Family History  Problem Relation Age of Onset  . Scleroderma Mother   . Diabetes Unknown   . Hypertension Unknown     Social History Social History  Substance Use Topics  . Smoking status: Never Smoker  . Smokeless tobacco: Never Used  . Alcohol use No     Comment: quit 10/05/2009     Allergies   Ibuprofen; Ketoprofen; and Topiramate   Review of Systems Review of Systems  All other systems reviewed and are negative.    Physical Exam Updated Vital Signs BP 133/88   Pulse 81   Temp 98.8 F (37.1 C) (Oral)   Resp 16   SpO2 100%   Physical Exam  Constitutional: He is oriented to person, place, and time. He appears well-developed and well-nourished.  HENT:  Head: Normocephalic and atraumatic.  Eyes: Pupils are equal, round, and reactive to light. Conjunctivae are normal. Right eye exhibits no discharge. Left eye exhibits no discharge. No scleral icterus.  Neck: Normal range of motion. No JVD present. No tracheal deviation present.  Pulmonary/Chest: Effort normal. No stridor.  Abdominal: Soft. He exhibits no distension and no mass. There is tenderness. There is no rebound and no guarding. No hernia.  Exquisite tenderness to even brushing of the skin of the left upper quadrant, remainder of abdominal exam benign  Neurological: He is alert and oriented to person, place, and time. He has normal strength. No cranial nerve deficit or sensory deficit. Coordination normal. GCS eye subscore is 4. GCS verbal subscore is 5. GCS motor subscore is 6.  Psychiatric: He has a normal mood and affect. His behavior is normal. Judgment and thought content normal.  Nursing note and vitals reviewed.   ED Treatments / Results  Labs (all labs ordered are listed, but only abnormal results are displayed) Labs Reviewed  CBC - Abnormal; Notable for the following:       Result Value   MCV 77.6 (*)    All other  components within normal limits  COMPREHENSIVE METABOLIC PANEL - Abnormal; Notable for the following:    Potassium 3.1 (*)    Glucose, Bld 103 (*)    All other components within normal limits    EKG  EKG Interpretation  Date/Time:  Saturday June 19 2017 15:44:20 EDT Ventricular Rate:  86 PR Interval:    QRS Duration: 80 QT Interval:  355 QTC Calculation: 425 R Axis:   24 Text Interpretation:  Sinus rhythm When compared to prior, no significant changes seen.  No STEMI Confirmed by Theda Belfast ([id number]) on 06/19/2017 4:50:26 PM       Radiology No results found.  Procedures Procedures (including critical care time)  Medications Ordered in ED Medications  naproxen (NAPROSYN)  tablet 375 mg (not administered)     Initial Impression / Assessment and Plan / ED Course  I have reviewed the triage vital signs and the nursing notes.  Pertinent labs & imaging results that were available during my care of the patient were reviewed by me and considered in my medical decision making (see chart for details).       Final Clinical Impressions(s) / ED Diagnoses   Final diagnoses:  Paresthesia  Vertigo  Hypokalemia   Labs: CBC, CMP   Imaging:  Consults:  Therapeutics:  Discharge Meds:   Assessment/Plan: 41 year old male presents today with numerous vague complaints. He has had workup for these in the past with no significant findings. Most notably MRI without significant abnormalities. I have very low suspicion for any acute life-threatening etiology in this patient. He is well-appearing in no acute distress with reassuring workup. Patient is slightly hypokalemic, likely secondary to HCTZ. He is taking supplemental potassium at home, he will follow with his primary care for management of this. Patient has a neurology follow-up this week encouraged to maintain the follow-up. He is given strict return precautions. He verbalized understanding and agreement to today's  plan.     New Prescriptions New Prescriptions   No medications on file     Rosalio Loud 06/19/17 1925    Eyvonne Mechanic, PA-C 06/19/17 1926    Tegeler, Canary Brim, MD 06/20/17 [phone]

## 2017-06-19 NOTE — Discharge Instructions (Signed)
Please read attached information. If you experience any new or worsening signs or symptoms please return to the emergency room for evaluation. Please follow-up with your primary care provider or specialist as discussed.  °

## 2017-06-19 NOTE — ED Notes (Signed)
Per EMS report: Pt from home-C/O dizziness w/hx vertigo.  Was seen at [facility] last month for same.  Told EMS that he isn't sure it is vertigo and wants a second opinion.

## 2017-06-19 NOTE — ED Triage Notes (Signed)
Pt states he would like to be checked for generalized tingling that started 2 hours ago. Also c/o "pain in the middle of the neck on the back" 8/10.

## 2017-06-23 ENCOUNTER — Encounter: Payer: Self-pay | Admitting: Adult Health

## 2017-06-23 ENCOUNTER — Ambulatory Visit (INDEPENDENT_AMBULATORY_CARE_PROVIDER_SITE_OTHER): Payer: BC Managed Care – PPO | Admitting: Adult Health

## 2017-06-23 VITALS — BP 128/88 | HR 76 | Wt 261.2 lb

## 2017-06-23 DIAGNOSIS — G43019 Migraine without aura, intractable, without status migrainosus: Secondary | ICD-10-CM

## 2017-06-23 MED ORDER — AMITRIPTYLINE HCL 10 MG PO TABS: 10.0000 mg | ORAL_TABLET | Freq: Every day | ORAL | 3 refills | Status: DC

## 2017-06-23 NOTE — Patient Instructions (Signed)
Your Plan:  Start Amitriptyline 10 mg at bedtime  Continue to monitor your headache frequency. Keep a headache journal  To prevent or relieve headaches, try the following: Cool Compress. Lie down and place a cool compress on your head.  Avoid headache triggers. If certain foods or odors seem to have triggered your migraines in the past, avoid them. A headache diary might help you identify triggers.  Include physical activity in your daily routine. Try a daily walk or other moderate aerobic exercise.  Manage stress. Find healthy ways to cope with the stressors, such as delegating tasks on your to-do list.  Practice relaxation techniques. Try deep breathing, yoga, massage and visualization.  Eat regularly. Eating regularly scheduled meals and maintaining a healthy diet might help prevent headaches. Also, drink plenty of fluids.  Follow a regular sleep schedule. Sleep deprivation might contribute to headaches Consider biofeedback. With this mind-body technique, you learn to control certain bodily functions - such as muscle tension, heart rate and blood pressure - to prevent headaches or reduce headache pain.   Proceed to emergency room if you experience new or worsening symptoms or symptoms do not resolve, if you have new neurologic symptoms or if headache is severe, or for any concerning symptom.   Provided education and documentation from American headache Society toolbox including articles on: chronic migraine medication overuse headache, chronic migraines, prevention of migraines, behavioral and other nonpharmacologic treatments for headache.  Amitriptyline tablets What is this medicine? AMITRIPTYLINE (a mee TRIP ti leen) is used to treat depression. This medicine may be used for other purposes; ask your health care provider or pharmacist if you have questions. COMMON BRAND NAME(S): Elavil, Vanatrip What should I tell my health care provider before I take this medicine? They need to know if  you have any of these conditions: -an alcohol problem -asthma, difficulty breathing -bipolar disorder or schizophrenia -difficulty passing urine, prostate trouble -glaucoma -heart disease or previous heart attack -liver disease -over active thyroid -seizures -thoughts or plans of suicide, a previous suicide attempt, or family history of suicide attempt -an unusual or allergic reaction to amitriptyline, other medicines, foods, dyes, or preservatives -pregnant or trying to get pregnant -breast-feeding How should I use this medicine? Take this medicine by mouth with a drink of water. Follow the directions on the prescription label. You can take the tablets with or without food. Take your medicine at regular intervals. Do not take it more often than directed. Do not stop taking this medicine suddenly except upon the advice of your doctor. Stopping this medicine too quickly may cause serious side effects or your condition may worsen. A special MedGuide will be given to you by the pharmacist with each prescription and refill. Be sure to read this information carefully each time. Talk to your pediatrician regarding the use of this medicine in children. Special care may be needed. Overdosage: If you think you have taken too much of this medicine contact a poison control center or emergency room at once. NOTE: This medicine is only for you. Do not share this medicine with others. What if I miss a dose? If you miss a dose, take it as soon as you can. If it is almost time for your next dose, take only that dose. Do not take double or extra doses. What may interact with this medicine? Do not take this medicine with any of the following medications: -arsenic trioxide -certain medicines used to regulate abnormal heartbeat or to treat other heart conditions -cisapride -droperidol -  halofantrine -linezolid -MAOIs like Carbex, Eldepryl, Marplan, Nardil, and Parnate -methylene blue -other medicines for  mental depression -phenothiazines like perphenazine, thioridazine and chlorpromazine -pimozide -probucol -procarbazine -sparfloxacin -St. John's Wort -ziprasidone This medicine may also interact with the following medications: -atropine and related drugs like hyoscyamine, scopolamine, tolterodine and others -barbiturate medicines for inducing sleep or treating seizures, like phenobarbital -cimetidine -disulfiram -ethchlorvynol -thyroid hormones such as levothyroxine This list may not describe all possible interactions. Give your health care provider a list of all the medicines, herbs, non-prescription drugs, or dietary supplements you use. Also tell them if you smoke, drink alcohol, or use illegal drugs. Some items may interact with your medicine. What should I watch for while using this medicine? Tell your doctor if your symptoms do not get better or if they get worse. Visit your doctor or health care professional for regular checks on your progress. Because it may take several weeks to see the full effects of this medicine, it is important to continue your treatment as prescribed by your doctor. Patients and their families should watch out for new or worsening thoughts of suicide or depression. Also watch out for sudden changes in feelings such as feeling anxious, agitated, panicky, irritable, hostile, aggressive, impulsive, severely restless, overly excited and hyperactive, or not being able to sleep. If this happens, especially at the beginning of treatment or after a change in dose, call your health care professional. Bonita Quin may get drowsy or dizzy. Do not drive, use machinery, or do anything that needs mental alertness until you know how this medicine affects you. Do not stand or sit up quickly, especially if you are an older patient. This reduces the risk of dizzy or fainting spells. Alcohol may interfere with the effect of this medicine. Avoid alcoholic drinks. Do not treat yourself for  coughs, colds, or allergies without asking your doctor or health care professional for advice. Some ingredients can increase possible side effects. Your mouth may get dry. Chewing sugarless gum or sucking hard candy, and drinking plenty of water will help. Contact your doctor if the problem does not go away or is severe. This medicine may cause dry eyes and blurred vision. If you wear contact lenses you may feel some discomfort. Lubricating drops may help. See your eye doctor if the problem does not go away or is severe. This medicine can cause constipation. Try to have a bowel movement at least every 2 to 3 days. If you do not have a bowel movement for 3 days, call your doctor or health care professional. This medicine can make you more sensitive to the sun. Keep out of the sun. If you cannot avoid being in the sun, wear protective clothing and use sunscreen. Do not use sun lamps or tanning beds/booths. What side effects may I notice from receiving this medicine? Side effects that you should report to your doctor or health care professional as soon as possible: -allergic reactions like skin rash, itching or hives, swelling of the face, lips, or tongue -anxious -breathing problems -changes in vision -confusion -elevated mood, decreased need for sleep, racing thoughts, impulsive behavior -eye pain -fast, irregular heartbeat -feeling faint or lightheaded, falls -feeling agitated, angry, or irritable -fever with increased sweating -hallucination, loss of contact with reality -seizures -stiff muscles -suicidal thoughts or other mood changes -tingling, pain, or numbness in the feet or hands -trouble passing urine or change in the amount of urine -trouble sleeping -unusually weak or tired -vomiting -yellowing of the eyes or skin Side  effects that usually do not require medical attention (report to your doctor or health care professional if they continue or are bothersome): -change in sex drive  or performance -change in appetite or weight -constipation -dizziness -dry mouth -nausea -tired -tremors -upset stomach This list may not describe all possible side effects. Call your doctor for medical advice about side effects. You may report side effects to FDA at 1-800-FDA-1088. Where should I keep my medicine? Keep out of the reach of children. Store at room temperature between 20 and 25 degrees C (68 and 77 degrees F). Throw away any unused medicine after the expiration date. NOTE: This sheet is a summary. It may not cover all possible information. If you have questions about this medicine, talk to your doctor, pharmacist, or health care provider.  2018 Elsevier/Gold Standard (2016-02-21 12:14:15)       Thank you for coming to see [place] at [facility]. I hope we have been able to provide you high quality care today.  You may receive a patient satisfaction survey over the next few weeks. We would appreciate your feedback and comments so that we may continue to improve ourselves and the health of our patients.
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
## 2017-07-09 ENCOUNTER — Telehealth: Payer: Self-pay | Admitting: *Deleted

## 2017-07-09 NOTE — Telephone Encounter (Signed)
Spoke with patient and advised this RN needs to reschedule his FU on 09/13/17 to later that day. He verbalized understanding, rescheduled for 10 am. He understands he will get a reminder call. Marland Kitchen

## 2017-07-20 NOTE — Progress Notes (Signed)
I reviewed note and agree with plan.   VIKRAM R. PENUMALLI, MD  Certified in Neurology, Neurophysiology and Neuroimaging  [facility] [address], [address] [place], [place] [address] [phone]   

## 2017-09-04 ENCOUNTER — Emergency Department (HOSPITAL_COMMUNITY)
Admission: EM | Admit: 2017-09-04 | Discharge: 2017-09-04 | Disposition: A | Discharge status: Home / Self Care | Payer: BC Managed Care – PPO | Attending: Emergency Medicine | Admitting: Emergency Medicine

## 2017-09-04 ENCOUNTER — Encounter (HOSPITAL_COMMUNITY): Payer: Self-pay | Admitting: Nurse Practitioner

## 2017-09-04 ENCOUNTER — Emergency Department (HOSPITAL_COMMUNITY): Payer: BC Managed Care – PPO

## 2017-09-04 DIAGNOSIS — Z79899 Other long term (current) drug therapy: Secondary | ICD-10-CM | POA: Diagnosis not present

## 2017-09-04 DIAGNOSIS — R1012 Left upper quadrant pain: Secondary | ICD-10-CM

## 2017-09-04 DIAGNOSIS — I10 Essential (primary) hypertension: Secondary | ICD-10-CM | POA: Insufficient documentation

## 2017-09-04 LAB — CBC
HCT: 44.6 % (ref 39.0–52.0)
Hemoglobin: 15.1 g/dL (ref 13.0–17.0)
MCH: 27.2 pg (ref 26.0–34.0)
MCHC: 33.9 g/dL (ref 30.0–36.0)
MCV: 80.4 fL (ref 78.0–100.0)
Platelets: 293 10*3/uL (ref 150–400)
RBC: 5.55 MIL/uL (ref 4.22–5.81)
RDW: 13.4 % (ref 11.5–15.5)
WBC: 6.9 10*3/uL (ref 4.0–10.5)

## 2017-09-04 LAB — URINALYSIS, ROUTINE W REFLEX MICROSCOPIC
Bilirubin Urine: NEGATIVE
GLUCOSE, UA: NEGATIVE mg/dL
HGB URINE DIPSTICK: NEGATIVE
KETONES UR: NEGATIVE mg/dL
Leukocytes, UA: NEGATIVE
Nitrite: NEGATIVE
PROTEIN: NEGATIVE mg/dL
Specific Gravity, Urine: 1.004 — ABNORMAL LOW (ref 1.005–1.030)
pH: 6 (ref 5.0–8.0)

## 2017-09-04 LAB — LIPASE, BLOOD: LIPASE: 28 U/L (ref 11–51)

## 2017-09-04 LAB — COMPREHENSIVE METABOLIC PANEL
ALT: 20 U/L (ref 17–63)
AST: 22 U/L (ref 15–41)
Albumin: 4.2 g/dL (ref 3.5–5.0)
Alkaline Phosphatase: 107 U/L (ref 38–126)
Anion gap: 8 (ref 5–15)
BUN: 10 mg/dL (ref 6–20)
CO2: 27 mmol/L (ref 22–32)
CREATININE: 1.31 mg/dL — AB (ref 0.61–1.24)
Calcium: 9.2 mg/dL (ref 8.9–10.3)
Chloride: 102 mmol/L (ref 101–111)
GFR calc Af Amer: >60 mL/min (ref >60)
Glucose, Bld: 102 mg/dL — ABNORMAL HIGH (ref 65–99)
Potassium: 3.8 mmol/L (ref 3.5–5.1)
Sodium: 137 mmol/L (ref 135–145)
Total Bilirubin: 0.3 mg/dL (ref 0.3–1.2)
Total Protein: 7.9 g/dL (ref 6.5–8.1)

## 2017-09-04 MED ORDER — IOPAMIDOL (ISOVUE-300) INJECTION 61%
INTRAVENOUS | Status: AC
  Filled 2017-09-04: qty 100

## 2017-09-04 MED ORDER — IOPAMIDOL (ISOVUE-300) INJECTION 61%
100.0000 mL | Freq: Once | INTRAVENOUS | Status: AC | PRN
  Administered 2017-09-04: 100 mL via INTRAVENOUS

## 2017-09-04 MED ORDER — SODIUM CHLORIDE 0.9 % IV BOLUS (SEPSIS)
1000.0000 mL | Freq: Once | INTRAVENOUS | Status: AC
  Administered 2017-09-04: 1000 mL via INTRAVENOUS

## 2017-09-04 MED ORDER — TRAMADOL HCL 50 MG PO TABS: 50.0000 mg | ORAL_TABLET | Freq: Four times a day (QID) | ORAL | 0 refills | Status: DC | PRN

## 2017-09-04 MED ORDER — MORPHINE SULFATE (PF) 4 MG/ML IV SOLN
4.0000 mg | Freq: Once | INTRAVENOUS | Status: AC
  Administered 2017-09-04: 4 mg via INTRAVENOUS
  Filled 2017-09-04: qty 1

## 2017-09-04 NOTE — ED Triage Notes (Signed)
Pt is c/o left sided flank pain and LUQ abdominal pain. States he was seen at urgent care and diagnosed with bladder stones.

## 2017-09-04 NOTE — ED Provider Notes (Signed)
[facility]-EMERGENCY DEPT Provider Note   CSN: [id number] Arrival date & time: 09/04/17  0006     History   Chief Complaint Chief Complaint  Patient presents with  . Flank Pain  . Abdominal Pain    HPI Dylan Guerrero is a 41 y.o. male.  HPI Patient presents with concern of left upper quadrant abdominal pain, left flank pain. Pain is been present for several days, worse over the past day or 2. Pain is focally in this area, with mild radiation inferiorly. No new urinary complaints, there is mild nausea, but no vomiting Patient has a history of constipation, has had relief with Linzess. No fever, no chest pain, no dyspnea. No relief with OTC medication. Past Medical History:  Diagnosis Date  . Abdominal pain   . Allergy   . Arthritis   . Carpal tunnel syndrome   . GERD (gastroesophageal reflux disease)   . Headache(784.0)   . Hernia   . Hypertension   . Hypokalemia   . Infection of the inner ear   . Migraine   . Sinus infection   . Sleep apnea   . Spider bite     Patient Active Problem List   Diagnosis Date Noted  . Morbid obesity due to excess calories (HCC) 04/11/2017  . Essential hypertension 04/11/2017  . DOE (dyspnea on exertion) 04/09/2017  . Migraine with aura 01/09/2013  . Umbilical pain 10/22/2011    Past Surgical History:  Procedure Laterality Date  . APPENDECTOMY  1984  . HERNIA REPAIR  01/30/2011   Right Inguinal Hernia repair with mesh  . HERNIA REPAIR  01/30/2011   Umbilical Hernia repair with meash       Home Medications    Prior to Admission medications   Medication Sig Start Date End Date Taking? Authorizing Provider  acetaminophen (TYLENOL) 500 MG tablet Take 500 mg by mouth every 6 (six) hours as needed (pain).    [provider]  albuterol (PROVENTIL HFA;VENTOLIN HFA) 108 (90 Base) MCG/ACT inhaler Inhale 2 puffs into the lungs every 6 (six) hours as needed for wheezing or shortness of breath.     [provider]  amitriptyline (ELAVIL) 10 MG tablet Take 1 tablet (10 mg total) by mouth at bedtime. 06/23/17   Butch PennyMillikan, Megan, NP  amLODipine (NORVASC) 10 MG tablet Take 10 mg by mouth daily.    [provider]  bisoprolol (ZEBETA) 5 MG tablet Take 1 tablet (5 mg total) by mouth daily. 04/09/17   Nyoka CowdenWert, Michael B, MD  hydrochlorothiazide (HYDRODIURIL) 25 MG tablet Take 25 mg by mouth daily.    [provider]  meclizine (ANTIVERT) 25 MG tablet Take 25 mg by mouth 3 (three) times daily as needed for dizziness.    [provider]  methocarbamol (ROBAXIN) 750 MG tablet Take 750 mg by mouth every 8 (eight) hours as needed for muscle spasms.    [provider]  Multiple Vitamin (MULTIVITAMIN WITH MINERALS) TABS tablet Take 1 tablet by mouth daily.    [provider]  omeprazole (PRILOSEC) 40 MG capsule Take 1 capsule (40 mg total) by mouth daily. 04/12/17   Nyoka CowdenWert, Michael B, MD  psyllium (METAMUCIL SMOOTH TEXTURE) 28 % packet Take 1 packet by mouth daily.    [provider]  UNABLE TO FIND Med Name: CPAP  [facility]    [provider]  Vitamin D, Ergocalciferol, (DRISDOL) 50000 units CAPS capsule Take 50,000 Units by mouth every 7 (seven) days.  [provider]    Family History Family History  Problem Relation Age of Onset  . Scleroderma Mother   . Diabetes Unknown   . Hypertension Unknown     Social History Social History   Tobacco Use  . Smoking status: Never Smoker  . Smokeless tobacco: Never Used  Substance Use Topics  . Alcohol use: No    Alcohol/week: 0.0 oz    Comment: quit 10/05/2009  . Drug use: No     Allergies   Ibuprofen; Ketoprofen; and Topiramate   Review of Systems Review of Systems  Constitutional:       Per HPI, otherwise negative  HENT:       Per HPI, otherwise negative  Respiratory:       Per HPI, otherwise negative  Cardiovascular:       Per HPI, otherwise negative    Gastrointestinal: Negative for vomiting.  Endocrine:       Negative aside from HPI  Genitourinary:       Neg aside from HPI   Musculoskeletal:       Per HPI, otherwise negative  Skin: Negative.   Neurological: Negative for syncope.     Physical Exam Updated Vital Signs BP 122/83 (BP Location: Left Arm)   Pulse 69   Temp 98.6 F (37 C) (Oral)   Resp 15   Ht 6\' 2"  (1.88 m)   Wt 119.3 kg (263 lb)   SpO2 100%   BMI 33.77 kg/m   Physical Exam  Constitutional: He is oriented to person, place, and time. He appears well-developed. No distress.  HENT:  Head: Normocephalic and atraumatic.  Eyes: Conjunctivae and EOM are normal.  Cardiovascular: Normal rate and regular rhythm.  Pulmonary/Chest: Effort normal. No stridor. No respiratory distress.  Abdominal: He exhibits no distension.    Musculoskeletal: He exhibits no edema.  Neurological: He is alert and oriented to person, place, and time.  Skin: Skin is warm and dry.  Psychiatric: He has a normal mood and affect.  Nursing note and vitals reviewed.    ED Treatments / Results  Labs (all labs ordered are listed, but only abnormal results are displayed) Labs Reviewed  COMPREHENSIVE METABOLIC PANEL - Abnormal; Notable for the following components:      Result Value   Glucose, Bld 102 (*)    Creatinine, Ser 1.31 (*)    All other components within normal limits  URINALYSIS, ROUTINE W REFLEX MICROSCOPIC - Abnormal; Notable for the following components:   Color, Urine STRAW (*)    Specific Gravity, Urine 1.004 (*)    All other components within normal limits  LIPASE, BLOOD  CBC    EKG  EKG Interpretation None       Radiology Ct Abdomen Pelvis W Contrast  Result Date: 09/04/2017 CLINICAL DATA:  LEFT-sided flank pain and LEFT upper quadrant abdominal pain. EXAM: CT ABDOMEN AND PELVIS WITH CONTRAST TECHNIQUE: Multidetector CT imaging of the abdomen and pelvis was performed using the standard protocol following  bolus administration of intravenous contrast. CONTRAST:  ISOVUE-300 IOPAMIDOL (ISOVUE-300) INJECTION 61% COMPARISON:  CT abdomen and pelvis 12/22/2016. FINDINGS: Lower chest: No acute abnormality. Hepatobiliary: No focal liver abnormality is seen. No gallstones, gallbladder wall thickening, or biliary dilatation. Pancreas: Unremarkable. No pancreatic ductal dilatation or surrounding inflammatory changes. Spleen: Normal in size without focal abnormality. Small splenule incidentally noted. Adrenals/Urinary Tract: Adrenal glands are unremarkable. Kidneys are normal, without renal calculi, focal lesion, or hydronephrosis. Bladder is unremarkable. Stomach/Bowel: Stomach is within normal  limits. Appendix not visualized, surgically absent. No evidence of bowel wall thickening, distention, or inflammatory changes. Moderate stool burden in the RIGHT colon. Scattered areas of colonic diverticulosis without features of diverticulitis. Vascular/Lymphatic: No significant vascular findings are present. No enlarged abdominal or pelvic lymph nodes. Reproductive: Prostate is unremarkable.  Query RIGHT hydrocele. Other: Incidental fat containing RIGHT inguinal hernia. No abdominopelvic ascites. Musculoskeletal: No acute or significant osseous findings. Compared with prior study, similar appearance. IMPRESSION: Unremarkable CT abdomen and pelvis with contrast. No evidence for nephrolithiasis, hydronephrosis, or diverticulitis. Electronically Signed   By: Elsie StainJohn T Curnes M.D.   On: 09/04/2017 08:25    Procedures Procedures (including critical care time)  Medications Ordered in ED Medications  sodium chloride 0.9 % bolus 1,000 mL (not administered)  morphine 4 MG/ML injection 4 mg (not administered)     Initial Impression / Assessment and Plan / ED Course  I have reviewed the triage vital signs and the nursing notes.  Pertinent labs & imaging results that were available during my care of the patient were reviewed by  me and considered in my medical decision making (see chart for details).  8:44 AM On repeat exam patient is awake and alert, in no distress. I discussed all findings with him, including reassuring CT scan, reassuring labs Absent fever, distress, peritonitis, there is suspicion for the constipation or passed kidney stone is contributing to his abdominal pain. Another consideration is early shingles, given his description of pain in a dermatomal distribution, but absent cutaneous changes, no indication for empiric therapy. Patient has primary care visit scheduled in 48 hours, and given his reassuring findings as appropriate for outpatient follow-up. Patient will be started on a course of analgesia, encouraged to continue taking his bowel movement regimen.    Final Clinical Impressions(s) / ED Diagnoses  Abdominal pain   Gerhard MunchLockwood, Tamina Cyphers, MD 09/04/17 [phone]

## 2017-09-04 NOTE — Discharge Instructions (Signed)
As discussed, your evaluation today has been largely reassuring.  But, it is important that you monitor your condition carefully, and do not hesitate to return to the ED if you develop new, or concerning changes in your condition. ? ?Otherwise, please follow-up with your physician for appropriate ongoing care. ? ?

## 2017-09-08 ENCOUNTER — Encounter (HOSPITAL_COMMUNITY): Payer: Self-pay | Admitting: *Deleted

## 2017-09-08 ENCOUNTER — Other Ambulatory Visit: Payer: Self-pay

## 2017-09-08 ENCOUNTER — Ambulatory Visit (HOSPITAL_COMMUNITY)
Admission: EM | Admit: 2017-09-08 | Discharge: 2017-09-08 | Disposition: A | Discharge status: Home / Self Care | Payer: BC Managed Care – PPO | Attending: Family Medicine | Admitting: Family Medicine

## 2017-09-08 DIAGNOSIS — G44209 Tension-type headache, unspecified, not intractable: Secondary | ICD-10-CM

## 2017-09-08 DIAGNOSIS — M26621 Arthralgia of right temporomandibular joint: Secondary | ICD-10-CM | POA: Diagnosis not present

## 2017-09-08 MED ORDER — KETOROLAC TROMETHAMINE 30 MG/ML IJ SOLN
30.0000 mg | Freq: Once | INTRAMUSCULAR | Status: AC
  Administered 2017-09-08: 30 mg via INTRAMUSCULAR

## 2017-09-08 MED ORDER — KETOROLAC TROMETHAMINE 30 MG/ML IJ SOLN
INTRAMUSCULAR | Status: AC
  Filled 2017-09-08: qty 1

## 2017-09-08 NOTE — ED Provider Notes (Signed)
[facility]    CSN: [id number] Arrival date & time: 09/08/17  1037     History   Chief Complaint Chief Complaint  Patient presents with  . Headache    HPI Dylan Guerrero is a 41 y.o. male.   41 year old male with this morning with pain to the right side of the face. He points to the right scalp just above and forward of the top of the ear. Pain travels along the temple and toward the right TMJ. Pain is worse when turning his head to the right as well as clenching his teeth. The greatest amount of pain is in the TMJ. No recent head trauma or injury. Denies focal paresthesias or weakness. Denies problems with vision, speech, hearing, swallowing, orientation or memory.      Past Medical History:  Diagnosis Date  . Abdominal pain   . Allergy   . Arthritis   . Carpal tunnel syndrome   . GERD (gastroesophageal reflux disease)   . Headache(784.0)   . Hernia   . Hypertension   . Hypokalemia   . Infection of the inner ear   . Migraine   . Sinus infection   . Sleep apnea   . Spider bite     Patient Active Problem List   Diagnosis Date Noted  . Morbid obesity due to excess calories (HCC) 04/11/2017  . Essential hypertension 04/11/2017  . DOE (dyspnea on exertion) 04/09/2017  . Migraine with aura 01/09/2013  . Umbilical pain 10/22/2011    Past Surgical History:  Procedure Laterality Date  . APPENDECTOMY  1984  . HERNIA REPAIR  01/30/2011   Right Inguinal Hernia repair with mesh  . HERNIA REPAIR  01/30/2011   Umbilical Hernia repair with meash       Home Medications    Prior to Admission medications   Medication Sig Start Date End Date Taking? Authorizing Provider  amLODipine (NORVASC) 10 MG tablet Take 10 mg by mouth daily.   Yes [provider]  hydrochlorothiazide (HYDRODIURIL) 25 MG tablet Take 25 mg by mouth daily.   Yes [provider]  linaclotide (LINZESS) 145 MCG CAPS capsule Take 145 mcg by mouth daily.   Yes [provider]  acetaminophen (TYLENOL) 500 MG tablet Take 500 mg by mouth every 6 (six) hours as needed (pain).    [provider]  albuterol (PROVENTIL HFA;VENTOLIN HFA) 108 (90 Base) MCG/ACT inhaler Inhale 2 puffs into the lungs every 6 (six) hours as needed for wheezing or shortness of breath.    [provider]  bisoprolol (ZEBETA) 5 MG tablet Take 1 tablet (5 mg total) by mouth daily. 04/09/17   Nyoka CowdenWert, Michael B, MD  colchicine 0.6 MG tablet Take 0.6 mg by mouth 2 (two) times daily as needed.    [provider]  fluticasone (FLONASE) 50 MCG/ACT nasal spray Place 1 spray into the nose daily.    [provider]  gabapentin (NEURONTIN) 100 MG capsule Take 100 mg by mouth 3 (three) times daily.    [provider]  meclizine (ANTIVERT) 25 MG tablet Take 25 mg by mouth 3 (three) times daily as needed for dizziness.    [provider]  methocarbamol (ROBAXIN) 750 MG tablet Take 750 mg by mouth every 8 (eight) hours as needed for muscle spasms.    [provider]  Multiple Vitamin (MULTIVITAMIN WITH MINERALS) TABS tablet Take 1 tablet by mouth daily.    [provider]  omeprazole (PRILOSEC) 40  MG capsule Take 1 capsule (40 mg total) by mouth daily. 04/12/17   Nyoka Cowden, MD  psyllium (METAMUCIL SMOOTH TEXTURE) 28 % packet Take 1 packet by mouth daily.    [provider]  traMADol (ULTRAM) 50 MG tablet Take 1 tablet (50 mg total) by mouth every 6 (six) hours as needed. 09/04/17   Gerhard Munch, MD  UNABLE TO FIND Med Name: CPAP  [facility]    [provider]  Vitamin D, Ergocalciferol, 2000 units CAPS Take 2,000 Units by mouth daily.     [provider]    Family History Family History  Problem Relation Age of Onset  . Scleroderma Mother   . Diabetes Unknown   . Hypertension Unknown     Social History Social History   Tobacco Use  . Smoking status: Never Smoker  . Smokeless tobacco: Never  Used  Substance Use Topics  . Alcohol use: No    Alcohol/week: 0.0 oz    Comment: quit 10/05/2009  . Drug use: No     Allergies   Ibuprofen; Ketoprofen; and Topiramate   Review of Systems Review of Systems  Constitutional: Negative.   HENT: Negative.   Respiratory: Negative.   Skin: Negative.   Neurological: Positive for headaches.  Psychiatric/Behavioral: Negative.   All other systems reviewed and are negative.    Physical Exam Triage Vital Signs ED Triage Vitals  Enc Vitals Group     BP 09/08/17 1106 125/85     Pulse Rate 09/08/17 1106 73     Resp --      Temp 09/08/17 1106 (!) 97.4 F (36.3 C)     Temp Source 09/08/17 1106 Oral     SpO2 09/08/17 1106 100 %     Weight --      Height --      Head Circumference --      Peak Flow --      Pain Score 09/08/17 1104 10     Pain Loc --      Pain Edu? --      Excl. in GC? --    No data found.  Updated Vital Signs BP 125/85 (BP Location: Right Arm)   Pulse 73   Temp (!) 97.4 F (36.3 C) (Oral)   SpO2 100%   Visual Acuity Right Eye Distance:   Left Eye Distance:   Bilateral Distance:    Right Eye Near:   Left Eye Near:    Bilateral Near:     Physical Exam  Constitutional: He is oriented to person, place, and time. He appears well-developed and well-nourished.  HENT:  Head: Normocephalic and atraumatic.  Tenderness to the scalp just above the ear and preauricular, temple and right facial tenderness over the TMJ. Positive for tenderness to the pterygoid muscle.  Eyes: EOM are normal. Pupils are equal, round, and reactive to light. Left eye exhibits no discharge.  Neck: Normal range of motion. Neck supple.  Pulmonary/Chest: Effort normal.  Neurological: He is alert and oriented to person, place, and time. No cranial nerve deficit.  Skin: Skin is warm and dry.  Psychiatric: He has a normal mood and affect.  Nursing note and vitals reviewed.    UC Treatments / Results  Labs (all labs ordered are  listed, but only abnormal results are displayed) Labs Reviewed - No data to display  EKG  EKG Interpretation None       Radiology No results found.  Procedures Procedures (including critical care time)  Medications Ordered in UC Medications  ketorolac (TORADOL) 30 MG/ML injection 30 mg (not administered)     Initial Impression / Assessment and Plan / UC Course  I have reviewed the triage vital signs and the nursing notes.  Pertinent labs & imaging results that were available during my care of the patient were reviewed by me and considered in my medical decision making (see chart for details).    Alternate ice and heat. Certainly advice for inflammation and then go to heat to relax muscles. Performed jaw relaxation muscle movements as demonstrated. Take Tylenol for pain. Follow-up with your primary care provider.    Final Clinical Impressions(s) / UC Diagnoses   Final diagnoses:  Acute non intractable tension-type headache  Arthralgia of right temporomandibular joint    ED Discharge Orders    None       Controlled Substance Prescriptions [place] Controlled Substance Registry consulted? Not Applicable   Hayden RasmussenMabe, Karry Barrilleaux, NP 09/08/17 1710

## 2017-09-08 NOTE — ED Triage Notes (Signed)
Per pt the right side of his face and head is pounding, per pt it started yesterday, per pt a bit congestion,

## 2017-09-08 NOTE — Discharge Instructions (Signed)
Alternate ice and heat. Certainly advice for inflammation and then go to heat to relax muscles. Performed jaw relaxation muscle movements as demonstrated. Take Tylenol for pain. Follow-up with your primary care provider.

## 2017-09-12 ENCOUNTER — Emergency Department (HOSPITAL_COMMUNITY): Payer: BC Managed Care – PPO

## 2017-09-12 ENCOUNTER — Encounter (HOSPITAL_COMMUNITY): Payer: Self-pay

## 2017-09-12 ENCOUNTER — Emergency Department (HOSPITAL_COMMUNITY)
Admission: EM | Admit: 2017-09-12 | Discharge: 2017-09-12 | Disposition: A | Discharge status: Home / Self Care | Payer: BC Managed Care – PPO | Attending: Emergency Medicine | Admitting: Emergency Medicine

## 2017-09-12 DIAGNOSIS — R1032 Left lower quadrant pain: Secondary | ICD-10-CM | POA: Insufficient documentation

## 2017-09-12 DIAGNOSIS — I10 Essential (primary) hypertension: Secondary | ICD-10-CM | POA: Insufficient documentation

## 2017-09-12 DIAGNOSIS — Z79899 Other long term (current) drug therapy: Secondary | ICD-10-CM | POA: Diagnosis not present

## 2017-09-12 LAB — URINALYSIS, ROUTINE W REFLEX MICROSCOPIC
BILIRUBIN URINE: NEGATIVE
Bacteria, UA: NONE SEEN
GLUCOSE, UA: NEGATIVE mg/dL
Hgb urine dipstick: NEGATIVE
KETONES UR: NEGATIVE mg/dL
LEUKOCYTES UA: NEGATIVE
NITRITE: NEGATIVE
PH: 7 (ref 5.0–8.0)
Protein, ur: NEGATIVE mg/dL
SQUAMOUS EPITHELIAL / LPF: NONE SEEN
Specific Gravity, Urine: 1.011 (ref 1.005–1.030)

## 2017-09-12 LAB — BASIC METABOLIC PANEL
Anion gap: 10 (ref 5–15)
BUN: 9 mg/dL (ref 6–20)
CHLORIDE: 101 mmol/L (ref 101–111)
CO2: 26 mmol/L (ref 22–32)
CREATININE: 1.16 mg/dL (ref 0.61–1.24)
Calcium: 9 mg/dL (ref 8.9–10.3)
GFR calc Af Amer: >60 mL/min (ref >60)
GFR calc non Af Amer: >60 mL/min (ref >60)
GLUCOSE: 99 mg/dL (ref 65–99)
Potassium: 4.4 mmol/L (ref 3.5–5.1)
SODIUM: 137 mmol/L (ref 135–145)

## 2017-09-12 LAB — CBC
HCT: 44 % (ref 39.0–52.0)
HEMOGLOBIN: 14.9 g/dL (ref 13.0–17.0)
MCH: 26.8 pg (ref 26.0–34.0)
MCHC: 33.9 g/dL (ref 30.0–36.0)
MCV: 79 fL (ref 78.0–100.0)
Platelets: 266 10*3/uL (ref 150–400)
RBC: 5.57 MIL/uL (ref 4.22–5.81)
RDW: 13.2 % (ref 11.5–15.5)
WBC: 5.4 10*3/uL (ref 4.0–10.5)

## 2017-09-12 MED ORDER — SODIUM CHLORIDE 0.9 % IV BOLUS (SEPSIS)
1000.0000 mL | Freq: Once | INTRAVENOUS | Status: AC
  Administered 2017-09-12: 1000 mL via INTRAVENOUS

## 2017-09-12 MED ORDER — IOPAMIDOL (ISOVUE-300) INJECTION 61%
INTRAVENOUS | Status: AC
  Administered 2017-09-12: 100 mL
  Filled 2017-09-12: qty 100

## 2017-09-12 MED ORDER — MORPHINE SULFATE (PF) 4 MG/ML IV SOLN
6.0000 mg | INTRAVENOUS | Status: DC | PRN
  Administered 2017-09-12: 6 mg via INTRAVENOUS
  Filled 2017-09-12: qty 2

## 2017-09-12 NOTE — ED Provider Notes (Signed)
[facility]   CSN: [id number] Arrival date & time: 09/12/17  1810     History   Chief Complaint Chief Complaint  Patient presents with  . Abdominal Pain    HPI Dylan Guerrero is a 41 y.o. male.  HPI 41 yo male with worsening LLQ abdominal pain for the past 2 weeks. Denies nausea and vomiting. Reports some loose stool this AM. No blood in his stool. Pain is constant and moderate in severity. No urinary complaints   Past Medical History:  Diagnosis Date  . Abdominal pain   . Allergy   . Arthritis   . Carpal tunnel syndrome   . GERD (gastroesophageal reflux disease)   . Headache(784.0)   . Hernia   . Hypertension   . Hypokalemia   . Infection of the inner ear   . Migraine   . Sinus infection   . Sleep apnea   . Spider bite     Patient Active Problem List   Diagnosis Date Noted  . Morbid obesity due to excess calories (HCC) 04/11/2017  . Essential hypertension 04/11/2017  . DOE (dyspnea on exertion) 04/09/2017  . Migraine with aura 01/09/2013  . Umbilical pain 10/22/2011    Past Surgical History:  Procedure Laterality Date  . APPENDECTOMY  1984  . HERNIA REPAIR  01/30/2011   Right Inguinal Hernia repair with mesh  . HERNIA REPAIR  01/30/2011   Umbilical Hernia repair with meash       Home Medications    Prior to Admission medications   Medication Sig Start Date End Date Taking? Authorizing Provider  acetaminophen (TYLENOL) 500 MG tablet Take 500 mg by mouth every 6 (six) hours as needed (pain).    [provider]  albuterol (PROVENTIL HFA;VENTOLIN HFA) 108 (90 Base) MCG/ACT inhaler Inhale 2 puffs into the lungs every 6 (six) hours as needed for wheezing or shortness of breath.    [provider]  amLODipine (NORVASC) 10 MG tablet Take 10 mg by mouth daily.    [provider]  bisoprolol (ZEBETA) 5 MG tablet Take 1 tablet (5 mg total) by mouth daily. 04/09/17   Nyoka CowdenWert, Michael  B, MD  colchicine 0.6 MG tablet Take 0.6 mg by mouth 2 (two) times daily as needed.    [provider]  fluticasone (FLONASE) 50 MCG/ACT nasal spray Place 1 spray into the nose daily.    [provider]  gabapentin (NEURONTIN) 100 MG capsule Take 100 mg by mouth 3 (three) times daily.    [provider]  hydrochlorothiazide (HYDRODIURIL) 25 MG tablet Take 25 mg by mouth daily.    [provider]  linaclotide (LINZESS) 145 MCG CAPS capsule Take 145 mcg by mouth daily.    [provider]  meclizine (ANTIVERT) 25 MG tablet Take 25 mg by mouth 3 (three) times daily as needed for dizziness.    [provider]  methocarbamol (ROBAXIN) 750 MG tablet Take 750 mg by mouth every 8 (eight) hours as needed for muscle spasms.    [provider]  Multiple Vitamin (MULTIVITAMIN WITH MINERALS) TABS tablet Take 1 tablet by mouth daily.    [provider]  omeprazole (PRILOSEC) 40 MG capsule Take 1 capsule (40 mg total) by mouth daily. 04/12/17   Nyoka CowdenWert, Michael B, MD  psyllium (METAMUCIL SMOOTH TEXTURE) 28 % packet Take 1 packet by mouth daily.    [provider]  traMADol (ULTRAM) 50 MG tablet Take 1  tablet (50 mg total) by mouth every 6 (six) hours as needed. 09/04/17   Gerhard MunchLockwood, Robert, MD  UNABLE TO FIND Med Name: CPAP  [facility]    [provider]  Vitamin D, Ergocalciferol, 2000 units CAPS Take 2,000 Units by mouth daily.     [provider]    Family History Family History  Problem Relation Age of Onset  . Scleroderma Mother   . Diabetes Unknown   . Hypertension Unknown     Social History Social History   Tobacco Use  . Smoking status: Never Smoker  . Smokeless tobacco: Never Used  Substance Use Topics  . Alcohol use: No    Alcohol/week: 0.0 oz    Comment: quit 10/05/2009  . Drug use: No     Allergies   Ibuprofen; Ketoprofen; and Topiramate   Review of Systems Review of Systems  All other  systems reviewed and are negative.    Physical Exam Updated Vital Signs BP 118/81 (BP Location: Right Arm)   Pulse 84   Temp 97.8 F (36.6 C) (Oral)   Resp 18   SpO2 95%   Physical Exam  Constitutional: He is oriented to person, place, and time. He appears well-developed and well-nourished.  HENT:  Head: Normocephalic and atraumatic.  Eyes: EOM are normal.  Neck: Normal range of motion.  Cardiovascular: Normal rate and regular rhythm.  Pulmonary/Chest: Effort normal and breath sounds normal. No respiratory distress.  Abdominal: Soft. He exhibits no distension.  Left lower quandrant tenderness  Musculoskeletal: Normal range of motion.  Neurological: He is alert and oriented to person, place, and time.  Skin: Skin is warm and dry.  Psychiatric: He has a normal mood and affect. Judgment normal.  Nursing note and vitals reviewed.    ED Treatments / Results  Labs (all labs ordered are listed, but only abnormal results are displayed) Labs Reviewed  CBC  BASIC METABOLIC PANEL  URINALYSIS, ROUTINE W REFLEX MICROSCOPIC    EKG  EKG Interpretation None       Radiology No results found.  Procedures Procedures (including critical care time)  Medications Ordered in ED Medications  sodium chloride 0.9 % bolus 1,000 mL (not administered)  morphine 4 MG/ML injection 6 mg (not administered)     Initial Impression / Assessment and Plan / ED Course  I have reviewed the triage vital signs and the nursing notes.  Pertinent labs & imaging results that were available during my care of the patient were reviewed by me and considered in my medical decision making (see chart for details).    8:28 PM Improvement in symptoms. Ct and labs and urine without acute findings. pcp follow up. If symptoms persist he may benefit from outpatient GI follow up and possible colonoscopy   Final Clinical Impressions(s) / ED Diagnoses   Final diagnoses:  Left lower quadrant pain    ED  Discharge Orders    None       Azalia Bilisampos, Krishana Lutze, MD 09/12/17 2029

## 2017-09-12 NOTE — ED Triage Notes (Signed)
Pt ambulated to room with EMS.  Pt reports having left sided abd pain x several weeks, worsened last night.  No N/V.  Pt reports loose stool this morning.

## 2017-09-12 NOTE — ED Notes (Signed)
Nurse currently starting an ulstrasound IV and will draw labs.

## 2017-09-13 ENCOUNTER — Ambulatory Visit: Payer: BC Managed Care – PPO | Admitting: Diagnostic Neuroimaging

## 2017-09-13 ENCOUNTER — Ambulatory Visit: Payer: Self-pay | Admitting: Diagnostic Neuroimaging

## 2017-09-16 ENCOUNTER — Ambulatory Visit: Payer: BC Managed Care – PPO | Admitting: Diagnostic Neuroimaging

## 2017-09-16 ENCOUNTER — Encounter: Payer: Self-pay | Admitting: Diagnostic Neuroimaging

## 2017-09-16 VITALS — BP 123/80 | HR 84 | Wt 264.4 lb

## 2017-09-16 DIAGNOSIS — F419 Anxiety disorder, unspecified: Secondary | ICD-10-CM | POA: Diagnosis not present

## 2017-09-16 DIAGNOSIS — G43109 Migraine with aura, not intractable, without status migrainosus: Secondary | ICD-10-CM

## 2017-09-16 DIAGNOSIS — G4733 Obstructive sleep apnea (adult) (pediatric): Secondary | ICD-10-CM

## 2017-09-16 DIAGNOSIS — R42 Dizziness and giddiness: Secondary | ICD-10-CM
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
## 2017-10-18 ENCOUNTER — Other Ambulatory Visit: Payer: Self-pay

## 2017-10-18 ENCOUNTER — Encounter (HOSPITAL_COMMUNITY): Payer: Self-pay

## 2017-10-18 ENCOUNTER — Emergency Department (HOSPITAL_COMMUNITY): Payer: BC Managed Care – PPO

## 2017-10-18 ENCOUNTER — Emergency Department (HOSPITAL_COMMUNITY)
Admission: EM | Admit: 2017-10-18 | Discharge: 2017-10-19 | Disposition: A | Discharge status: Home / Self Care | Payer: BC Managed Care – PPO | Attending: Emergency Medicine | Admitting: Emergency Medicine

## 2017-10-18 DIAGNOSIS — R0789 Other chest pain: Secondary | ICD-10-CM | POA: Diagnosis present

## 2017-10-18 DIAGNOSIS — I10 Essential (primary) hypertension: Secondary | ICD-10-CM | POA: Diagnosis not present

## 2017-10-18 DIAGNOSIS — R0602 Shortness of breath: Secondary | ICD-10-CM | POA: Diagnosis not present

## 2017-10-18 DIAGNOSIS — R072 Precordial pain: Secondary | ICD-10-CM | POA: Insufficient documentation

## 2017-10-18 LAB — CBC
HEMATOCRIT: 48.2 % (ref 39.0–52.0)
HEMOGLOBIN: 15.9 g/dL (ref 13.0–17.0)
MCH: 26.5 pg (ref 26.0–34.0)
MCHC: 33 g/dL (ref 30.0–36.0)
MCV: 80.5 fL (ref 78.0–100.0)
Platelets: 284 10*3/uL (ref 150–400)
RBC: 5.99 MIL/uL — AB (ref 4.22–5.81)
RDW: 14.1 % (ref 11.5–15.5)
WBC: 10.6 10*3/uL — ABNORMAL HIGH (ref 4.0–10.5)

## 2017-10-18 LAB — I-STAT TROPONIN, ED: Troponin i, poc: 0 ng/mL (ref 0.00–0.08)

## 2017-10-18 LAB — BASIC METABOLIC PANEL
ANION GAP: 16 — AB (ref 5–15)
BUN: 13 mg/dL (ref 6–20)
CALCIUM: 9.2 mg/dL (ref 8.9–10.3)
CHLORIDE: 98 mmol/L — AB (ref 101–111)
CO2: 23 mmol/L (ref 22–32)
Creatinine, Ser: 1.17 mg/dL (ref 0.61–1.24)
GFR calc non Af Amer: >60 mL/min (ref >60)
Glucose, Bld: 86 mg/dL (ref 65–99)
POTASSIUM: 3.3 mmol/L — AB (ref 3.5–5.1)
Sodium: 137 mmol/L (ref 135–145)

## 2017-10-18 LAB — TROPONIN I: Troponin I: <0.03 ng/mL (ref <0.03)

## 2017-10-18 MED ORDER — TRAMADOL HCL 50 MG PO TABS: 50.0000 mg | ORAL_TABLET | Freq: Four times a day (QID) | ORAL | 0 refills | Status: DC | PRN

## 2017-10-18 MED ORDER — GI COCKTAIL ~~LOC~~
30.0000 mL | Freq: Once | ORAL | Status: AC
  Administered 2017-10-18: 30 mL via ORAL
  Filled 2017-10-18: qty 30

## 2017-10-18 NOTE — ED Triage Notes (Signed)
Per Pt, Pt is coming from home with complaints of left sided chest burning that started last night. Pt reports some SOB with some nausea, but denies V/D

## 2017-10-18 NOTE — Discharge Instructions (Signed)

## 2017-10-18 NOTE — ED Notes (Addendum)
Sitting on edge of bed. Alert, NAD, calm, interactive, resps e/u, no dyspnea noted.

## 2017-10-18 NOTE — ED Provider Notes (Signed)
Emergency Department Provider Note   I have reviewed the triage vital signs and the nursing notes.   HISTORY  Chief Complaint Chest Pain   HPI Dylan Guerrero is a 42 y.o. male with PMH of GERD, HTN, and OSA presents to the emergency department for evaluation of left-sided chest tightness with associated sharp pain radiating around to the chest wall to the spine.  Pain is worse with movement of the left upper extremity and with palpation.  No exertional or pleuritic component to pain.  Patient denies any shortness of breath.  No fevers or chills.  No productive cough or hemoptysis.  No change in discomfort with eating.   Past Medical History:  Diagnosis Date  . Abdominal pain   . Allergy   . Arthritis   . Carpal tunnel syndrome   . GERD (gastroesophageal reflux disease)   . Headache(784.0)   . Hernia   . Hypertension   . Hypokalemia   . Infection of the inner ear   . Migraine   . Sinus infection   . Sleep apnea   . Spider bite     Patient Active Problem List   Diagnosis Date Noted  . Morbid obesity due to excess calories (HCC) 04/11/2017  . Essential hypertension 04/11/2017  . DOE (dyspnea on exertion) 04/09/2017  . Migraine with aura 01/09/2013  . Umbilical pain 10/22/2011    Past Surgical History:  Procedure Laterality Date  . APPENDECTOMY  1984  . HERNIA REPAIR  01/30/2011   Right Inguinal Hernia repair with mesh  . HERNIA REPAIR  01/30/2011   Umbilical Hernia repair with meash    Current Outpatient Rx  . Order #: [id number] Class: Historical Med  . Order #: [id number] Class: Historical Med  . Order #: [id number] Class: Historical Med  . Order #: [id number] Class: Normal  . Order #: [id number] Class: Historical Med  . Order #: [id number] Class: Historical Med  . Order #: [id number] Class: Historical Med  . Order #: [id number] Class: Historical Med  . Order #: [id number] Class: Historical Med  . Order #: [id number] Class: Historical Med  . Order #: [id number] Class:  Historical Med  . Order #: [id number] Class: Historical Med  . Order #: [id number] Class: Historical Med  . Order #: [id number] Class: Historical Med  . Order #: [id number] Class: Historical Med  . Order #: [id number] Class: Normal  . Order #: [id number] Class: Historical Med  . Order #: [id number] Class: Historical Med  . Order #: [id number] Class: Print    Allergies Ibuprofen; Ketoprofen; Topiramate; and Adhesive [tape]  Family History  Problem Relation Age of Onset  . Scleroderma Mother   . Diabetes Unknown   . Hypertension Unknown     Social History Social History   Tobacco Use  . Smoking status: Never Smoker  . Smokeless tobacco: Never Used  Substance Use Topics  . Alcohol use: No    Alcohol/week: 0.0 oz    Comment: quit 10/05/2009  . Drug use: No    Review of Systems  Constitutional: No fever/chills Eyes: No visual changes. ENT: No sore throat. Cardiovascular: Positive chest pain. Respiratory: Denies shortness of breath. Gastrointestinal: No abdominal pain.  No nausea, no vomiting.  No diarrhea.  No constipation. Genitourinary: Negative for dysuria. Musculoskeletal: Negative for back pain. Skin: Negative for rash. Neurological: Negative for headaches, focal weakness or numbness.  10-point ROS otherwise negative.  ____________________________________________   PHYSICAL EXAM:  VITAL SIGNS: ED Triage Vitals  Enc Vitals Group     BP 10/18/17 1644 128/89     Pulse Rate 10/18/17 1644 67  Resp 10/18/17 1644 16     Temp 10/18/17 1644 97.6 F (36.4 C)     Temp Source 10/18/17 1644 Oral     SpO2 10/18/17 1644 99 %     Weight 10/18/17 1642 265 lb (120.2 kg)     Height 10/18/17 1642 6\' 2"  (1.88 m)     Pain Score 10/18/17 1642 10   Constitutional: Alert and oriented. Well appearing and in no acute distress. Eyes: Conjunctivae are normal.  Head: Atraumatic. Nose: No congestion/rhinnorhea. Mouth/Throat: Mucous membranes are moist.  Neck: No stridor.    Cardiovascular: Normal rate, regular rhythm. Good peripheral circulation. Grossly normal heart sounds.   Respiratory: Normal respiratory effort.  No retractions. Lungs CTAB. Gastrointestinal: Soft and nontender. No distention.  Musculoskeletal: No lower extremity tenderness nor edema. No gross deformities of extremities. Neurologic:  Normal speech and language. No gross focal neurologic deficits are appreciated.  Skin:  Skin is warm, dry and intact. No rash noted.  ____________________________________________   LABS (all labs ordered are listed, but only abnormal results are displayed)  Labs Reviewed  BASIC METABOLIC PANEL - Abnormal; Notable for the following components:      Result Value   Potassium 3.3 (*)    Chloride 98 (*)    Anion gap 16 (*)    All other components within normal limits  CBC - Abnormal; Notable for the following components:   WBC 10.6 (*)    RBC 5.99 (*)    All other components within normal limits  TROPONIN I  I-STAT TROPONIN, ED   ____________________________________________  EKG   EKG Interpretation  Date/Time:  Monday October 18 2017 16:44:58 EST Ventricular Rate:  67 PR Interval:  150 QRS Duration: 82 QT Interval:  394 QTC Calculation: 416 R Axis:   34 Text Interpretation:  Normal sinus rhythm with sinus arrhythmia Cannot rule out Anterior infarct , age undetermined Abnormal ECG Confirmed by Jacalyn Lefevre [phone]) on 10/18/2017 8:33:21 PM       ____________________________________________  RADIOLOGY  Dg Chest 2 View  Result Date: 10/18/2017 CLINICAL DATA:  One day history of mid chest pain and shortness of breath. Current history of hypertension. Nonsmoker. EXAM: CHEST  2 VIEW COMPARISON:  03/31/2017, 02/12/2017 and earlier. FINDINGS: Cardiomediastinal silhouette unremarkable, unchanged. Lungs clear. Bronchovascular markings normal. Pulmonary vascularity normal. No visible pleural effusions. No pneumothorax. Visualized bony thorax intact.  No interval change dating back to 2012. IMPRESSION: Normal examination. Electronically Signed   By: Hulan Saas M.D.   On: 10/18/2017 18:38    ____________________________________________   PROCEDURES  Procedure(s) performed:   Procedures  None ____________________________________________   INITIAL IMPRESSION / ASSESSMENT AND PLAN / ED COURSE  Pertinent labs & imaging results that were available during my care of the patient were reviewed by me and considered in my medical decision making (see chart for details).  Patient presents to the emergency department for evaluation of chest pain.  Chest pain history is atypical.  He has some tenderness in a dermatomal distribution with no evidence of rash.  Low suspicion of zoster without rash but I did discuss this diagnosis with the patient and would have him return if he develops rash.  He has tenderness in the area and I think muscular skeletal is more likely.  Plan for GI cocktail and reassess.  Patient had delta troponins while in the waiting room unremarkable.   Differential includes all life-threatening causes for chest pain. This includes but is not exclusive to acute coronary syndrome, aortic dissection,  pulmonary embolism, cardiac tamponade, community-acquired pneumonia, pericarditis, musculoskeletal chest wall pain, etc.  No improvement with GI cocktail. Suspect MSK etiology. Plan for discharge with PCP and Cardiology follow up.   At this time, I do not feel there is any life-threatening condition present. I have reviewed and discussed all results (EKG, imaging, lab, urine as appropriate), exam findings with patient. I have reviewed nursing notes and appropriate previous records.  I feel the patient is safe to be discharged home without further emergent workup. Discussed usual and customary return precautions. Patient and family (if present) verbalize understanding and are comfortable with this plan.  Patient will follow-up with  their primary care provider. If they do not have a primary care provider, information for follow-up has been provided to them. All questions have been answered.   ____________________________________________  FINAL CLINICAL IMPRESSION(S) / ED DIAGNOSES  Final diagnoses:  Precordial chest pain     MEDICATIONS GIVEN DURING THIS VISIT:  Medications  gi cocktail (Maalox,Lidocaine,Donnatal) (30 mLs Oral Given 10/18/17 2231)     NEW OUTPATIENT MEDICATIONS STARTED DURING THIS VISIT:  New Prescriptions   TRAMADOL (ULTRAM) 50 MG TABLET    Take 1 tablet (50 mg total) by mouth every 6 (six) hours as needed.    Note:  This document was prepared using Dragon voice recognition software and may include unintentional dictation errors.  Alona Bene, MD Emergency Medicine    Enrica Corliss, Arlyss Repress, MD 10/18/17 [phone]

## 2017-10-19 NOTE — ED Notes (Signed)
Alert, NAD, calm, interactive, resps e/u, speaking in clear complete sentences, no dyspnea noted, skin W&D, VSS, c/o continued pain and sob, (denies: nausea, dizziness or visual changes). Denies questions or needs, given Rx x1 and referral.

## 2017-11-18 ENCOUNTER — Telehealth: Payer: Self-pay | Admitting: Diagnostic Neuroimaging

## 2017-11-18 NOTE — Telephone Encounter (Signed)
Spoke with patient who stated he has been having random episodes of dizziness x 3 days. He stated  Meclizine is not helpful. He's not having headaches but is seeing halos in his eyes. He continues to have tinnitus. He is using his CPAP. This RN asked if he is having any other symptoms such as ear ache, cold/sinus symptoms; he denied.  He is requesting to be seen before June FU. This RN rescheduled him for March and advised if he feels better to call > 24 hours ahead of FU to cancel. Patient verbalized understanding, appreciation.

## 2017-11-18 NOTE — Telephone Encounter (Signed)
Pt  Called stating he was told by Dr Marjory LiesPenumalli that if things worsened for him to call back to be seen sooner.  Pt is stating the dizziness has worsen and he would very much like to be seen a lot sooner than June. Please call

## 2017-11-25 ENCOUNTER — Ambulatory Visit: Payer: BC Managed Care – PPO | Admitting: Internal Medicine

## 2017-11-28 ENCOUNTER — Encounter: Payer: Self-pay | Admitting: Neurology

## 2017-11-29 ENCOUNTER — Encounter: Payer: Self-pay | Admitting: Internal Medicine

## 2017-11-29 ENCOUNTER — Ambulatory Visit: Payer: BC Managed Care – PPO | Admitting: Internal Medicine

## 2017-11-29 VITALS — BP 138/90 | HR 74 | Ht 74.0 in | Wt 266.4 lb

## 2017-11-29 DIAGNOSIS — R0609 Other forms of dyspnea: Secondary | ICD-10-CM | POA: Diagnosis not present

## 2017-11-29 NOTE — Assessment & Plan Note (Addendum)
Onset fall 2017  Spirometry 04/09/2017  FEV1 3.33 (82%)  Ratio 85   Allergy profile 04/09/2017 >  Eos 0.2 /  IgE  282 [employer] tree, cockroach dust  - 04/09/2017  Walked RA x 3 laps @ 185 ft each stopped due to  End of study, nl pace, no sob or desat but throat tightness > rec ppi qam ac x 6 weeks then cpst as already had neg cards w/u per [name]  > did not call to schedule cpst as rec - 11/29/2017  Walked RA x 3 laps @ 185 ft each stopped due to  End of study, slow pace, no  desat  But tightness in throat and sob worse at end  of study   - Spirometry 11/29/2017  FEV1 3.41 (85%)  Ratio 88    Symptoms are markedly disproportionate to objective findings and not clear this is actually much of a  lung problem but pt does appear to have difficult to sort out respiratory symptoms of unknown origin for which  DDX  = almost all start with A and  include Adherence, Ace Inhibitors, Acid Reflux, Active Sinus Disease, Alpha 1 Antitripsin deficiency, Anxiety masquerading as Airways dz,  ABPA,  Allergy(esp in young), Aspiration (esp in elderly), Adverse effects of meds,  Active smokers, A bunch of PE's (a small clot burden can't cause this syndrome unless there is already severe underlying pulm or vascular dz with poor reserve), Anemia and thyroid dz,  plus two Bs  = Bronchiectasis and Beta blocker use..and one C= CHF    Most likely suspects: ? Anxiety > usually at the bottom of this list of usual suspects but should be much higher on this pt's based on H and P  - sob standing up and yet can walk despite MO and orthopedic issues x 450 feet for us today  ? Acid (or non-acid) GERD > always difficult to exclude as up to 75% of pts in some series report no assoc GI/ Heartburn symptoms> rec max (24h)  acid suppression and diet restrictions/ reviewed and instructions given in writing.   ? Allergy/asthma > very unlikely with saba use only p exertion, suggested he try it prior and see if any benefit and if not >>  reduce  "dependency" and proceed with cpst with spirometry before and after.  ? Anemia  Excluded> needs tsh  >> Follow up per Primary Care planned    ? BB effects > very unlikely on bisoprolol  In low doses  ? Cardiac > multiple neg w/u despite "throat fullness" with ex > points back to gerd/ es spasm    Proceed with cpst next if can tol bicycle ergometry   I had an extended discussion with the patient reviewing all relevant studies completed to date and  lasting 25 minutes of a 40  minute acute office visit to re-establish     re  severe non-specific but potentially very serious refractory respiratory symptoms of uncertain and potentially multiple  etiologies.   Each maintenance medication was reviewed in detail including most importantly the difference between maintenance and prns and under what circumstances the prns are to be triggered using an action plan format that is not reflected in the computer generated alphabetically organized AVS.    Please see AVS for specific instructions unique to this office visit that I personally wrote and verbalized to the the pt in detail and then reviewed with pt  by my nurse highlighting any changes in therapy/plan of care  recommended at today's visit.

## 2017-11-29 NOTE — Patient Instructions (Addendum)
Continue Try prilosec otc 40mg   Take 30-60 min before first meal of the day and Pepcid ac (famotidine) 20 mg one hours before   bedtime until cough is completely gone for at least a week without the need for cough suppression    To get the most out of exercise, you need to be continuously aware that you are short of breath, but never out of breath, for 30 minutes daily. As you improve, it will actually be easier for you to do the same amount of exercise  in  30 minutes so always push to the level where you are short of breath.    Please see patient coordinator before you leave today  to schedule cpst  - needs tsh to complete the w/u

## 2017-11-29 NOTE — Progress Notes (Signed)
Subjective:     Patient ID: Dylan Guerrero, male   DOB: 04/17/1976,     MRN: [id number]    Brief patient profile:  [age] yobm never smoker healthy as teenager including soccer but stopped aerobics p 10 th grade and started gaining wt and noted gradual doe but could still do jog  mile thru sept 2017 then  much worse since  feb 2018 assoc with globus but neg ent w/u and neg response to gerd rx so referred to pulmonary clinic 04/09/2017 by Dr Mikeal HawthorneGarba     History of Present Illness 04/09/2017 1st [facility] office visit/ Dylan Guerrero   Chief Complaint  Patient presents with  . Pulmonary Consult    Referred by Dr. Gwenyth BouillonMohammad.  Pt states having SOB and feeling light headed for the past 5 months. He gets SOB walking or even just standing.  He states he has CP occ when he takes a deep breath or when he moves his left side. He gets light headed every time he gets SOB. He has an albuterol inhaler that he uses 4 x per wk on average.   spells of sob up to 5 min at rest / no better with inhaler / happens avg  once a day s pattern but assoc with loss of voice and chest discomfort  Not typically at hs while on cpap Ex tol down to a block nl pace walk  tilley eval neg for cards feb 2018  Takes omeprazole 20 mg x 30 min a supper but most of his symptoms are before supper  No better p saba  clariton x 2  Years no better rec Stop inderol (proranolol)  Start bisoprolol 5 mg one daily in its place Only use your albuterol as a rescue medication   Omperazole 40mg   Take 30-60 min before first meal of the day  GERD diet If not happy by around the 1st August you need to call Almyra FreeLibby [phone] and ask to schedule a cpst > did not do   11/29/2017 extended acute ov/Dylan Guerrero re: re -establish  globus 24/7 with sob at rest standing / did not bring meds as req Chief Complaint  Patient presents with  . Acute Visit    Increased SOB for the past 3-4 wks. He states that he gets winded with any exertion such as even just standing up.   He also c/o non prod and occ wakes him up in the night.   He has been using his albuterol inhaler 2 x daily on average.   Dyspnea:   X standing up  Cough: noct > day but s  excess/ purulent sputum or mucus plugs   Sleep: on side ok on cpap  SABA use:  Only uses saba p ex   Saw Hung for ? IBS  [place] there with recurrent cp   No obvious day to day or daytime variability or assoc  mucus plugs or hemoptysis or cp or chest tightness, subjective wheeze or overt   hb symptoms. No unusual exposure hx or h/o childhood pna/ asthma or knowledge of premature birth.  Sleeping ok on cpap without nocturnal  or early am exacerbation  of respiratory  c/o's or need for noct saba. Also denies any obvious fluctuation of symptoms with weather or environmental changes or other aggravating or alleviating factors except as outlined above   Current Allergies, Complete Past Medical History, Past Surgical History, Family History, and Social History were reviewed in [facility] electronic  medical record.  ROS  The following are not active complaints unless bolded Hoarseness, sore throat, dysphagia, dental problems, itching, sneezing,  nasal congestion or discharge of excess mucus or purulent secretions, ear ache,   fever, chills, sweats, unintended wt loss or wt gain, classically pleuritic or exertional cp,  orthopnea pnd or leg swelling, presyncope, palpitations, abdominal pain, anorexia, nausea, vomiting, diarrhea  or change in bowel habits or change in bladder habits, change in stools or change in urine, dysuria, hematuria,  rash, arthralgias, visual complaints, headache, numbness, weakness or ataxia or problems with walking(uses cane)  or coordination,  change in mood/affect or memory.        Current Meds  Medication Sig  . acetaminophen (TYLENOL) 500 MG tablet Take 500 mg by mouth every 6 (six) hours as needed (for pain).   Marland Kitchen albuterol (PROVENTIL HFA;VENTOLIN HFA) 108 (90 Base)  MCG/ACT inhaler Inhale 2 puffs into the lungs every 6 (six) hours as needed for wheezing or shortness of breath.  Marland Kitchen amLODipine (NORVASC) 10 MG tablet Take 10 mg by mouth daily.  . bisoprolol (ZEBETA) 5 MG tablet Take 1 tablet (5 mg total) by mouth daily.  . colchicine 0.6 MG tablet Take 0.6 mg by mouth 2 (two) times daily as needed (for gout flares).   . fluticasone (FLONASE) 50 MCG/ACT nasal spray Place 1-2 sprays into both nostrils daily as needed for allergies or rhinitis.   Marland Kitchen gabapentin (NEURONTIN) 100 MG capsule Take 100 mg by mouth 3 (three) times daily.  . hydrochlorothiazide (HYDRODIURIL) 25 MG tablet Take 25 mg by mouth daily.  Marland Kitchen linaclotide (LINZESS) 145 MCG CAPS capsule Take 145 mcg by mouth daily.  Marland Kitchen loratadine (CLARITIN) 10 MG tablet Take 10 mg by mouth daily.  . meclizine (ANTIVERT) 25 MG tablet Take 25 mg by mouth 3 (three) times daily as needed for dizziness.  . meloxicam (MOBIC) 15 MG tablet Take 15 mg by mouth daily.  . methocarbamol (ROBAXIN) 750 MG tablet Take 750 mg by mouth every 8 (eight) hours as needed for muscle spasms.  . Multiple Vitamin (MULTIVITAMIN WITH MINERALS) TABS tablet Take 1 tablet by mouth daily.  Marland Kitchen omeprazole (PRILOSEC) 40 MG capsule Take 1 capsule (40 mg total) by mouth daily.  Marland Kitchen UNABLE TO FIND CPAP at bedtime            Objective:   Physical Exam  amb obese bm nad / freq throat clearing   11/29/2017       266   04/09/17 268 lb (121.6 kg)  03/31/17 267 lb (121.1 kg)  12/22/16 265 lb (120.2 kg)    Vital signs reviewed - Note on arrival 02 sats  98% on RA      HEENT: nl dentition, turbinates bilaterally, and oropharynx which is pristine. Nl external ear canals without cough reflex   NECK :  without JVD/Nodes/TM/ nl carotid upstrokes bilaterally   LUNGS: no acc muscle use,  Nl contour chest which is clear to A and P bilaterally without cough on insp or exp maneuvers   CV:  RRR  no s3 or murmur or increase in P2, and no edema   ABD:   Massively obese/ soft and nontender with nl inspiratory excursion in the supine position. No bruits or organomegaly appreciated, bowel sounds nl  MS:  Very slow gait with cane  ext warm without deformities, calf tenderness, cyanosis or clubbing No obvious joint restrictions   SKIN: warm and dry without lesions    NEURO:  alert,  approp, nl sensorium with  no motor or cerebellar deficits apparent.        I personally reviewed images and agree with radiology impression as follows:  CXR:   10/18/17 Normal examination.  Labs ordered/ reviewed:      Chemistry      Component Value Date/Time   NA 137 10/18/2017 1647   K 3.3 (L) 10/18/2017 1647   CL 98 (L) 10/18/2017 1647   CO2 23 10/18/2017 1647   BUN 13 10/18/2017 1647   CREATININE 1.17 10/18/2017 1647      Component Value Date/Time   CALCIUM 9.2 10/18/2017 1647   ALKPHOS 107 09/04/2017 0100   AST 22 09/04/2017 0100   ALT 20 09/04/2017 0100   BILITOT 0.3 09/04/2017 0100        Lab Results  Component Value Date   WBC 10.6 (H) 10/18/2017   HGB 15.9 10/18/2017   HCT 48.2 10/18/2017   MCV 80.5 10/18/2017   PLT 284 10/18/2017                      Assessment:

## 2017-11-30 ENCOUNTER — Encounter: Payer: Self-pay | Admitting: Internal Medicine

## 2017-11-30 ENCOUNTER — Encounter: Payer: Self-pay | Admitting: Neurology

## 2017-11-30 ENCOUNTER — Ambulatory Visit: Payer: BC Managed Care – PPO | Admitting: Neurology

## 2017-11-30 VITALS — BP 124/84 | HR 80 | Ht 74.0 in | Wt 260.0 lb

## 2017-11-30 DIAGNOSIS — G4733 Obstructive sleep apnea (adult) (pediatric): Secondary | ICD-10-CM

## 2017-11-30 DIAGNOSIS — Z9989 Dependence on other enabling machines and devices: Secondary | ICD-10-CM

## 2017-11-30 NOTE — Patient Instructions (Signed)
Please continue using your CPAP regularly. While your insurance may require that you use CPAP at least 4 hours each night on 70% of the nights, I recommend, that you not skip any nights and use it throughout the night if you can. Getting used to CPAP and staying with the treatment long term does take time and patience and discipline. Untreated obstructive sleep apnea when it is moderate to severe can have an adverse impact on cardiovascular health and raise her risk for heart disease, arrhythmias, hypertension, congestive heart failure, stroke and diabetes. Untreated obstructive sleep apnea causes sleep disruption, nonrestorative sleep, and sleep deprivation. This can have an impact on your day to day functioning and cause daytime sleepiness and impairment of cognitive function, memory loss, mood disturbance, and problems focussing. Using CPAP regularly can improve these symptoms. Keep up the good work! We can see you in 1 year, you can see one of our nurse practitioners as you are stable. I will see you after that.     

## 2017-11-30 NOTE — Progress Notes (Signed)
Subjective:    Patient ID: Dylan Guerrero is a 42 y.o. male.  HPI     Interim history:   Dylan Guerrero is a 42 year old right-handed gentleman with an underlying medical history of recurrent headaches, hypertension, allergies, arthritis, history of sinus infection, reflux disease, and obesity, who presents for follow-up consultation of his obstructive sleep apnea, after sleep study testing in 2017. He presents after a long gap of over 18 months. I first met him on 03/17/2016 at the request of Dr. Leta Baptist, at which time the patient reported snoring and a recent abnormal pulse oximetry test. I suggested we proceed with sleep study testing. The patient had a baseline sleep study, followed by a CPAP titration study. Baseline sleep study from 04/23/2016 showed a sleep efficiency of only 49.9%, he had a prolonged sleep latency and significant wake after sleep onset. He had a reduced REM percentage, delay in REM sleep as well. Total AHI was 90.4 per hour. Average oxygen saturation was 90%, nadir was 76%. Based on his test results I suggested we proceed with a full night CPAP titration study. He had this on 05/19/2016. Sleep efficiency was 69.2%, sleep latency 47.5 minutes and REM latency 132.5 minutes. CPAP was titrated from 5 cm to 11 cm. On the final pressure his AHI was 0 per hour, average oxygen saturation was 91%, O2 nadir was 90% on the final pressure. Based on his test results I prescribed CPAP therapy for home use at a pressure of 11 cm.  Today, 11/30/2017: I reviewed his CPAP compliance data from 10/30/2017 through 11/28/2017 which is a total of 30 days, during which time he used his CPAP every night with percent used days greater than 4 hours at 100%, indicating superb compliance with an average usage of 8 hours and 23 minutes, residual AHI 0.6 per hour, leak on the higher side with the 95th percentile at 28.1 liters per minute on a pressure of 11 cm with EPR of 3. He reports doing well. He has been  fully compliant with CPAP therapy and indicates good results including improvement of his sleep quality, sleep consolidation and daytime sleepiness. He has been up-to-date with his supplies, has been using nasal pillows but would like to try a nasal mask. Sometimes the pressure does not seem high enough for him especially when he first turns the machine on. He is advised that he can reduce or eliminate the ramp time. His weight has been fluctuating, typically within 5 or 6 pounds up or down. Of note, he has had numerous emergency room visits in the interim for various issues.  Previously:  03/17/2016: (He) reports possible snoring and an abnormal overnight pulse oximetry test recently. I reviewed your office note from 01/03/2016.  He denies morning headaches, has nocturia about once per night on average, does not always wake up rested, feels either adequately to marginally rested typically. Tries to go to bed by 8:30 and puts his TV on the 90 minute timer, typically is asleep about 30-45 minutes into it. Wakeup time is 4:30 AM. His weight has been fluctuating. Epworth sleepiness score is 1 out of 24 today, his fatigue score is 21 out of 63. Primary care physician ordered an overnight pulse oximetry test recently in April 2017. Results are not available for my review today but patient verbally reports that findings were mildly abnormal and PCP talked to him about potentially doing a sleep study, and the diagnosis of OSA and treatment options including CPAP at the time.  The patient is single and works as a Recruitment consultant for [employer]. He is a nonsmoker. He lives alone. He does not drink caffeine on a regular basis (maybe one cup per week) or alcohol on a regular basis, in fact quit alcohol in 2011, never heavy drinker. Has no pets, no children, no FHx of OSA. No RLS Sx.  His Past Medical History Is Significant For: Past Medical History:  Diagnosis Date  . Abdominal pain   . Allergy   .  Arthritis   . Carpal tunnel syndrome   . GERD (gastroesophageal reflux disease)   . Headache(784.0)   . Hernia   . Hypertension   . Hypokalemia   . Infection of the inner ear   . Migraine   . Sinus infection   . Sleep apnea   . Spider bite     His Past Surgical History Is Significant For: Past Surgical History:  Procedure Laterality Date  . APPENDECTOMY  1984  . HERNIA REPAIR  01/30/2011   Right Inguinal Hernia repair with mesh  . HERNIA REPAIR  70/35/0093   Umbilical Hernia repair with meash    His Family History Is Significant For: Family History  Problem Relation Age of Onset  . Scleroderma Mother   . Diabetes Unknown   . Hypertension Unknown     His Social History Is Significant For: Social History   Socioeconomic History  . Marital status: Single    Spouse name: None  . Number of children: 0  . Years of education: college  . Highest education level: None  Social Needs  . Financial resource strain: None  . Food insecurity - worry: None  . Food insecurity - inability: None  . Transportation needs - medical: None  . Transportation needs - non-medical: None  Occupational History  . Occupation: BUS DRIVER    Employer: [employer]  Tobacco Use  . Smoking status: Never Smoker  . Smokeless tobacco: Never Used  Substance and Sexual Activity  . Alcohol use: No    Alcohol/week: 0.0 oz    Comment: quit 10/05/2009  . Drug use: No  . Sexual activity: None  Other Topics Concern  . None  Social History Narrative   Pt lives at home alone.   Caffeine Use- maybe 1 cup of coffee a week     His Allergies Are:  Allergies  Allergen Reactions  . Ibuprofen Other (See Comments)    Acid reflux  . Ketoprofen Other (See Comments)    Muscle and stomach cramps   . Topiramate Other (See Comments)    Abdominal cramps and constipation   . Adhesive [Tape] Rash    Medical tape  :   His Current Medications Are:  Outpatient Encounter Medications as of  11/30/2017  Medication Sig  . acetaminophen (TYLENOL) 500 MG tablet Take 500 mg by mouth every 6 (six) hours as needed (for pain).   Marland Kitchen albuterol (PROVENTIL HFA;VENTOLIN HFA) 108 (90 Base) MCG/ACT inhaler Inhale 2 puffs into the lungs every 6 (six) hours as needed for wheezing or shortness of breath.  Marland Kitchen amLODipine (NORVASC) 10 MG tablet Take 10 mg by mouth daily.  . bisoprolol (ZEBETA) 5 MG tablet Take 1 tablet (5 mg total) by mouth daily.  . colchicine 0.6 MG tablet Take 0.6 mg by mouth 2 (two) times daily as needed (for gout flares).   . fluticasone (FLONASE) 50 MCG/ACT nasal spray Place 1-2 sprays into both nostrils daily as needed for allergies or rhinitis.   Marland Kitchen  gabapentin (NEURONTIN) 100 MG capsule Take 100 mg by mouth 3 (three) times daily.  . hydrochlorothiazide (HYDRODIURIL) 25 MG tablet Take 25 mg by mouth daily.  Marland Kitchen linaclotide (LINZESS) 145 MCG CAPS capsule Take 145 mcg by mouth daily.  Marland Kitchen loratadine (CLARITIN) 10 MG tablet Take 10 mg by mouth daily.  . meclizine (ANTIVERT) 25 MG tablet Take 25 mg by mouth 3 (three) times daily as needed for dizziness.  . meloxicam (MOBIC) 15 MG tablet Take 15 mg by mouth daily.  . methocarbamol (ROBAXIN) 750 MG tablet Take 750 mg by mouth every 8 (eight) hours as needed for muscle spasms.  . Multiple Vitamin (MULTIVITAMIN WITH MINERALS) TABS tablet Take 1 tablet by mouth daily.  Marland Kitchen omeprazole (PRILOSEC) 40 MG capsule Take 1 capsule (40 mg total) by mouth daily.  Marland Kitchen UNABLE TO FIND CPAP at bedtime   No facility-administered encounter medications on file as of 11/30/2017.   :  Review of Systems:  Out of a complete 14 point review of systems, all are reviewed and negative with the exception of these symptoms as listed below: Review of Systems  Neurological:       Pt presents today to discuss his sleep. Pt is doing well with the cpap, sometimes has trouble keeping his mask on at night.    Objective:  Neurological Exam  Physical Exam Physical  Examination:   Vitals:   11/30/17 1143  BP: 124/84  Pulse: 80   General Examination: The patient is a very pleasant 42 y.o. male in no acute distress. He appears well-developed and well-nourished and well groomed.   HEENT: Normocephalic, atraumatic, pupils are equal, round and reactive to light and accommodation. Extraocular tracking is good without limitation to gaze excursion or nystagmus noted. Normal smooth pursuit is noted. Hearing is grossly intact. Face is symmetric with normal facial animation and normal facial sensation. Speech is clear with no dysarthria noted. There is no hypophonia. There is no lip, neck/head, jaw or voice tremor. Neck is supple with full range of passive and active motion. There are no carotid bruits on auscultation. Oropharynx exam reveals: mild mouth dryness, adequate to marginal dental hygiene and moderate airway crowding. Tongue protrudes centrally and palate elevates symmetrically. Tonsils are 1-2+ bilaterally.   Chest: Clear to auscultation without wheezing, rhonchi or crackles noted.  Heart: S1+S2+0, regular and normal without murmurs, rubs or gallops noted.   Abdomen: Soft, non-tender and non-distended with normal bowel sounds appreciated on auscultation.  Extremities: There is no pitting edema in the distal lower extremities bilaterally.   Skin: Warm and dry without trophic changes noted.   Musculoskeletal: exam reveals no obvious joint deformities, tenderness or joint swelling or erythema. Brought a cane.   Neurologically:  Mental status: The patient is awake, alert and oriented in all 4 spheres. His immediate and remote memory, attention, language skills and fund of knowledge are appropriate. There is no evidence of aphasia, agnosia, apraxia or anomia. Speech is clear with normal prosody and enunciation. Thought process is linear. Mood is normal and affect is normal.  Cranial nerves II - XII are as described above under HEENT exam. In addition:  shoulder shrug is normal with equal shoulder height noted. Motor exam: Normal bulk, strength and tone is noted. There is no drift, tremor. Romberg is negative. Reflexes are 1-2+ throughout. Fine motor skills and coordination: grossly intact.  Cerebellar testing: No dysmetria or intention tremor. There is no truncal or gait ataxia.  Sensory exam: intact to light touch in  the upper and lower extremities.  Gait, station and balance: He stands easily. No veering to one side is noted. No leaning to one side is noted. Posture is age-appropriate and stance is narrow based. Gait shows slight limp on the R.   Assessment and Plan:  In summary, GAGE WEANT is a very pleasant 42 year old male with an underlying medical history of recurrent headaches, hypertension, allergies, arthritis, history of sinus infection, reflux disease, and obesity, who presents for follow-up consultation of his obstructive sleep apnea which was determined to be severe during his baseline sleep study on 04/23/2016. He had a subsequent CPAP titration study on 05/19/2016. He has been on CPAP therapy. He indicates full compliance and very good results, he has been up-to-date with his supplies. He is commended for his treatment adherence. In the past month he has been 100% compliant. I renewed his CPAP supply order, he would like to try a nasal mask.  I explained to the patient the importance of being fully compliant with CPAP therapy. His physical exam is largely stable, weight has been fluctuating. I suggested a one-year checkup routinely, he can see Jinny Blossom next time. I answered all his questions today and he was in agreement.

## 2017-12-06 ENCOUNTER — Other Ambulatory Visit (HOSPITAL_COMMUNITY): Payer: Self-pay | Admitting: *Deleted

## 2017-12-06 ENCOUNTER — Encounter (HOSPITAL_COMMUNITY): Payer: Self-pay | Admitting: *Deleted

## 2017-12-06 ENCOUNTER — Ambulatory Visit (HOSPITAL_COMMUNITY): Payer: BC Managed Care – PPO | Attending: Internal Medicine

## 2017-12-06 DIAGNOSIS — R0600 Dyspnea, unspecified: Secondary | ICD-10-CM | POA: Diagnosis not present

## 2017-12-06 DIAGNOSIS — R0609 Other forms of dyspnea: Principal | ICD-10-CM

## 2017-12-07 ENCOUNTER — Telehealth: Payer: Self-pay | Admitting: Internal Medicine

## 2017-12-07 NOTE — Progress Notes (Signed)
LMTCB

## 2017-12-07 NOTE — Telephone Encounter (Signed)
Notes recorded by Nyoka CowdenWert, Michael B, MD on 12/06/2017 at 5:09 PM EST Call patient : Study is c/w severe deconditioning and only way to help is regular sub max ex (30 min per day min) x 4 weeks then ov to see me or can see Dr Isaiah SergeMannam as second opinion as he supervises the cpst lab and may have further insights into how to help him   Called pt and advised message from the provider. Pt understood and verbalized understanding. Nothing further is needed.

## 2017-12-09 DIAGNOSIS — Z0271 Encounter for disability determination: Secondary | ICD-10-CM

## 2017-12-20 ENCOUNTER — Ambulatory Visit: Payer: BC Managed Care – PPO | Admitting: Diagnostic Neuroimaging

## 2017-12-20 ENCOUNTER — Encounter: Payer: Self-pay | Admitting: Diagnostic Neuroimaging

## 2017-12-20 VITALS — BP 120/82 | HR 80 | Ht 74.0 in | Wt 265.6 lb

## 2017-12-20 DIAGNOSIS — G43109 Migraine with aura, not intractable, without status migrainosus: Secondary | ICD-10-CM

## 2017-12-20 DIAGNOSIS — F419 Anxiety disorder, unspecified: Secondary | ICD-10-CM

## 2017-12-20 DIAGNOSIS — R42 Dizziness and giddiness: Secondary | ICD-10-CM | POA: Diagnosis not present
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
## 2017-12-21 ENCOUNTER — Emergency Department (HOSPITAL_COMMUNITY): Payer: BC Managed Care – PPO

## 2017-12-21 ENCOUNTER — Encounter (HOSPITAL_COMMUNITY): Payer: Self-pay

## 2017-12-21 ENCOUNTER — Emergency Department (HOSPITAL_COMMUNITY)
Admission: EM | Admit: 2017-12-21 | Discharge: 2017-12-21 | Disposition: A | Discharge status: Home / Self Care | Payer: BC Managed Care – PPO | Attending: Emergency Medicine | Admitting: Emergency Medicine

## 2017-12-21 ENCOUNTER — Other Ambulatory Visit: Payer: Self-pay

## 2017-12-21 DIAGNOSIS — R0789 Other chest pain: Secondary | ICD-10-CM | POA: Diagnosis not present

## 2017-12-21 DIAGNOSIS — Z79899 Other long term (current) drug therapy: Secondary | ICD-10-CM | POA: Diagnosis not present

## 2017-12-21 DIAGNOSIS — R1032 Left lower quadrant pain: Secondary | ICD-10-CM | POA: Diagnosis present

## 2017-12-21 DIAGNOSIS — R109 Unspecified abdominal pain: Secondary | ICD-10-CM

## 2017-12-21 DIAGNOSIS — I10 Essential (primary) hypertension: Secondary | ICD-10-CM | POA: Insufficient documentation

## 2017-12-21 DIAGNOSIS — R42 Dizziness and giddiness: Secondary | ICD-10-CM | POA: Insufficient documentation

## 2017-12-21 LAB — I-STAT TROPONIN, ED: Troponin i, poc: 0 ng/mL (ref 0.00–0.08)

## 2017-12-21 LAB — URINALYSIS, ROUTINE W REFLEX MICROSCOPIC
Bilirubin Urine: NEGATIVE
GLUCOSE, UA: NEGATIVE mg/dL
HGB URINE DIPSTICK: NEGATIVE
Ketones, ur: NEGATIVE mg/dL
Leukocytes, UA: NEGATIVE
Nitrite: NEGATIVE
PH: 7 (ref 5.0–8.0)
PROTEIN: NEGATIVE mg/dL
Specific Gravity, Urine: 1.01 (ref 1.005–1.030)

## 2017-12-21 LAB — COMPREHENSIVE METABOLIC PANEL
ALBUMIN: 4.2 g/dL (ref 3.5–5.0)
ALT: 16 U/L — AB (ref 17–63)
AST: 18 U/L (ref 15–41)
Alkaline Phosphatase: 115 U/L (ref 38–126)
Anion gap: 10 (ref 5–15)
BILIRUBIN TOTAL: 0.5 mg/dL (ref 0.3–1.2)
BUN: 10 mg/dL (ref 6–20)
CHLORIDE: 103 mmol/L (ref 101–111)
CO2: 28 mmol/L (ref 22–32)
CREATININE: 1.25 mg/dL — AB (ref 0.61–1.24)
Calcium: 9.1 mg/dL (ref 8.9–10.3)
GFR calc Af Amer: >60 mL/min (ref >60)
GLUCOSE: 109 mg/dL — AB (ref 65–99)
Potassium: 3.6 mmol/L (ref 3.5–5.1)
Sodium: 141 mmol/L (ref 135–145)
Total Protein: 7.8 g/dL (ref 6.5–8.1)

## 2017-12-21 LAB — CBC
HCT: 42.6 % (ref 39.0–52.0)
HEMOGLOBIN: 14.4 g/dL (ref 13.0–17.0)
MCH: 27.4 pg (ref 26.0–34.0)
MCHC: 33.8 g/dL (ref 30.0–36.0)
MCV: 81 fL (ref 78.0–100.0)
PLATELETS: 296 10*3/uL (ref 150–400)
RBC: 5.26 MIL/uL (ref 4.22–5.81)
RDW: 14 % (ref 11.5–15.5)
WBC: 5.5 10*3/uL (ref 4.0–10.5)

## 2017-12-21 LAB — LIPASE, BLOOD: LIPASE: 25 U/L (ref 11–51)

## 2017-12-21 MED ORDER — IOPAMIDOL (ISOVUE-300) INJECTION 61%
INTRAVENOUS | Status: AC
  Filled 2017-12-21: qty 100

## 2017-12-21 MED ORDER — SODIUM CHLORIDE 0.9 % IV SOLN
INTRAVENOUS | Status: DC
  Administered 2017-12-21: 10:00:00 via INTRAVENOUS

## 2017-12-21 MED ORDER — SODIUM CHLORIDE 0.9 % IV BOLUS (SEPSIS)
1000.0000 mL | Freq: Once | INTRAVENOUS | Status: AC
  Administered 2017-12-21: 1000 mL via INTRAVENOUS

## 2017-12-21 MED ORDER — MORPHINE SULFATE (PF) 4 MG/ML IV SOLN
4.0000 mg | Freq: Once | INTRAVENOUS | Status: AC
  Administered 2017-12-21: 4 mg via INTRAVENOUS
  Filled 2017-12-21: qty 1

## 2017-12-21 MED ORDER — IOPAMIDOL (ISOVUE-300) INJECTION 61%
100.0000 mL | Freq: Once | INTRAVENOUS | Status: AC | PRN
  Administered 2017-12-21: 100 mL via INTRAVENOUS

## 2017-12-21 MED ORDER — SODIUM CHLORIDE 0.9 % IJ SOLN
INTRAMUSCULAR | Status: AC
  Filled 2017-12-21: qty 50

## 2017-12-21 MED ORDER — LORAZEPAM 2 MG/ML IJ SOLN
0.5000 mg | Freq: Once | INTRAMUSCULAR | Status: AC
  Administered 2017-12-21: 0.5 mg via INTRAVENOUS
  Filled 2017-12-21: qty 1

## 2017-12-21 MED ORDER — ONDANSETRON HCL 4 MG/2ML IJ SOLN
4.0000 mg | Freq: Once | INTRAMUSCULAR | Status: AC
  Administered 2017-12-21: 4 mg via INTRAVENOUS
  Filled 2017-12-21: qty 2

## 2017-12-21 NOTE — ED Notes (Signed)
MD at bedside. 

## 2017-12-21 NOTE — ED Notes (Signed)
Computer program not letting me have pt e sign

## 2017-12-21 NOTE — ED Triage Notes (Signed)
Patient c/o intermittent left chest pain and left mid and lower abdominal pain and small amount of diarrhea since yesterday. Patient also c/o dizziness.

## 2017-12-21 NOTE — ED Provider Notes (Addendum)
[name]-EMERGENCY DEPT Provider Note   CSN: [id number] Arrival date & time: 12/21/17  0859     History   Chief Complaint Chief Complaint  Patient presents with  . Abdominal Pain  . Chest Pain  . Dizziness    HPI Dylan Guerrero is a 42 y.o. male.  This is a 42 year old male who presents with sudden onset of left lower quadrant abdominal pain that began after he was seen by his GI doctor yesterday.  Pain is been sharp and persistent and worse with any palpation.  Pain does radiate to his left anterior chest but denies any anginal symptoms or shoulder pain.  Some diarrhea but denies any blood.  No fever or chills.  No vomiting noted.  Denies any dysuria or hematuria.  No pain to his groin.  Symptoms have been persistent nothing makes them better.  No treatment used prior to arrival.      Past Medical History:  Diagnosis Date  . Abdominal pain   . Allergy   . Arthritis   . Carpal tunnel syndrome   . GERD (gastroesophageal reflux disease)   . Headache(784.0)   . Hernia   . Hypertension   . Hypokalemia   . Infection of the inner ear   . Migraine   . Sinus infection   . Sleep apnea   . Spider bite     Patient Active Problem List   Diagnosis Date Noted  . Morbid obesity due to excess calories (HCC) 04/11/2017  . Essential hypertension 04/11/2017  . DOE (dyspnea on exertion) 04/09/2017  . Migraine with aura 01/09/2013  . Umbilical pain 10/22/2011    Past Surgical History:  Procedure Laterality Date  . APPENDECTOMY  1984  . HERNIA REPAIR  01/30/2011   Right Inguinal Hernia repair with mesh  . HERNIA REPAIR  01/30/2011   Umbilical Hernia repair with meash       Home Medications    Prior to Admission medications   Medication Sig Start Date End Date Taking? Authorizing Provider  acetaminophen (TYLENOL) 500 MG tablet Take 500 mg by mouth every 6 (six) hours as needed (for pain).     [provider]  albuterol (PROVENTIL  HFA;VENTOLIN HFA) 108 (90 Base) MCG/ACT inhaler Inhale 2 puffs into the lungs every 6 (six) hours as needed for wheezing or shortness of breath.    [provider]  amLODipine (NORVASC) 10 MG tablet Take 10 mg by mouth daily.    [provider]  bisoprolol (ZEBETA) 5 MG tablet Take 1 tablet (5 mg total) by mouth daily. 04/09/17   Nyoka CowdenWert, Michael B, MD  colchicine 0.6 MG tablet Take 0.6 mg by mouth 2 (two) times daily as needed (for gout flares).     [provider]  fluticasone (FLONASE) 50 MCG/ACT nasal spray Place 1-2 sprays into both nostrils daily as needed for allergies or rhinitis.     [provider]  gabapentin (NEURONTIN) 100 MG capsule Take 100 mg by mouth 3 (three) times daily.    [provider]  hydrochlorothiazide (HYDRODIURIL) 25 MG tablet Take 25 mg by mouth daily.    [provider]  hyoscyamine (ANASPAZ) 0.125 MG TBDP disintergrating tablet Place 0.125 mg under the tongue 3 (three) times daily.    [provider]  linaclotide (LINZESS) 145 MCG CAPS capsule Take 145 mcg by mouth daily.    [provider]  loratadine (CLARITIN) 10 MG tablet Take 10 mg by mouth daily.  [provider]  meclizine (ANTIVERT) 25 MG tablet Take 25 mg by mouth 3 (three) times daily as needed for dizziness.    [provider]  meloxicam (MOBIC) 15 MG tablet Take 15 mg by mouth daily.    [provider]  methocarbamol (ROBAXIN) 750 MG tablet Take 750 mg by mouth every 8 (eight) hours as needed for muscle spasms.    [provider]  Multiple Vitamin (MULTIVITAMIN WITH MINERALS) TABS tablet Take 1 tablet by mouth daily.    [provider]  omeprazole (PRILOSEC) 40 MG capsule Take 1 capsule (40 mg total) by mouth daily. 04/12/17   Nyoka Cowden, MD  UNABLE TO FIND CPAP at bedtime    [provider]    Family History Family History  Problem Relation Age of Onset  . Scleroderma Mother     . Diabetes Unknown   . Hypertension Unknown     Social History Social History   Tobacco Use  . Smoking status: Never Smoker  . Smokeless tobacco: Never Used  Substance Use Topics  . Alcohol use: No    Alcohol/week: 0.0 oz    Comment: quit 10/05/2009  . Drug use: No     Allergies   Ibuprofen; Ketoprofen; Topiramate; and Adhesive [tape]   Review of Systems Review of Systems  All other systems reviewed and are negative.    Physical Exam Updated Vital Signs BP (!) 153/94   Pulse 92   Temp 98.5 F (36.9 C) (Oral)   Resp 16   Ht 1.88 m (6\' 2" )   Wt 120.2 kg (265 lb)   SpO2 99%   BMI 34.02 kg/m   Physical Exam  Constitutional: He is oriented to person, place, and time. He appears well-developed and well-nourished.  Non-toxic appearance. No distress.  HENT:  Head: Normocephalic and atraumatic.  Eyes: Conjunctivae, EOM and lids are normal. Pupils are equal, round, and reactive to light.  Neck: Normal range of motion. Neck supple. No tracheal deviation present. No thyroid mass present.  Cardiovascular: Normal rate, regular rhythm and normal heart sounds. Exam reveals no gallop.  No murmur heard. Pulmonary/Chest: Effort normal and breath sounds normal. No stridor. No respiratory distress. He has no decreased breath sounds. He has no wheezes. He has no rhonchi. He has no rales.  Abdominal: Soft. Normal appearance and bowel sounds are normal. He exhibits no distension. There is tenderness in the left lower quadrant. There is guarding. There is no rigidity, no rebound and no CVA tenderness.    Musculoskeletal: Normal range of motion. He exhibits no edema or tenderness.  Neurological: He is alert and oriented to person, place, and time. He has normal strength. No cranial nerve deficit or sensory deficit. GCS eye subscore is 4. GCS verbal subscore is 5. GCS motor subscore is 6.  Skin: Skin is warm and dry. No abrasion and no rash noted.  Psychiatric: He has a normal mood and  affect. His speech is normal and behavior is normal.  Nursing note and vitals reviewed.    ED Treatments / Results  Labs (all labs ordered are listed, but only abnormal results are displayed) Labs Reviewed  CBC  COMPREHENSIVE METABOLIC PANEL  LIPASE, BLOOD  URINALYSIS, ROUTINE W REFLEX MICROSCOPIC  I-STAT TROPONIN, ED    EKG  EKG Interpretation  Date/Time:  Tuesday December 21 2017 09:30:02 EDT Ventricular Rate:  81 PR Interval:    QRS Duration: 83 QT Interval:  359 QTC Calculation: 417 R Axis:  48 Text Interpretation:  Sinus rhythm Baseline wander in lead(s) V3 No significant change since last tracing Confirmed by Lorre Nick ([id number]) on 12/21/2017 9:58:41 AM       Radiology No results found.  Procedures Procedures (including critical care time)  Medications Ordered in ED Medications  morphine 4 MG/ML injection 4 mg (not administered)  ondansetron (ZOFRAN) injection 4 mg (not administered)  sodium chloride 0.9 % bolus 1,000 mL (not administered)  0.9 %  sodium chloride infusion (not administered)     Initial Impression / Assessment and Plan / ED Course  I have reviewed the triage vital signs and the nursing notes.  Pertinent labs & imaging results that were available during my care of the patient were reviewed by me and considered in my medical decision making (see chart for details).     Patient treated for pain here and does feel better.  Abdominal CT without acute findings.  His urinalysis -2.  He has an appointment today at 4 PM with his primary care doctor.  He denies any testicular or scrotal pain.  No concern for torsion.  Return precautions given  Final Clinical Impressions(s) / ED Diagnoses   Final diagnoses:  None    ED Discharge Orders    None       Lorre Nick, MD 12/21/17 1305    Lorre Nick, MD 12/21/17 1306    Lorre Nick, MD 12/21/17 1308

## 2017-12-29 ENCOUNTER — Other Ambulatory Visit: Payer: Self-pay | Admitting: Otolaryngology

## 2017-12-29 DIAGNOSIS — H9311 Tinnitus, right ear: Secondary | ICD-10-CM

## 2018-01-05 ENCOUNTER — Ambulatory Visit: Payer: BC Managed Care – PPO | Admitting: Internal Medicine

## 2018-01-11 ENCOUNTER — Other Ambulatory Visit: Payer: BC Managed Care – PPO

## 2018-02-08 ENCOUNTER — Emergency Department (HOSPITAL_COMMUNITY): Payer: BC Managed Care – PPO

## 2018-02-08 ENCOUNTER — Emergency Department (HOSPITAL_COMMUNITY)
Admission: EM | Admit: 2018-02-08 | Discharge: 2018-02-08 | Disposition: A | Discharge status: Home / Self Care | Payer: BC Managed Care – PPO | Attending: Emergency Medicine | Admitting: Emergency Medicine

## 2018-02-08 ENCOUNTER — Encounter (HOSPITAL_COMMUNITY): Payer: Self-pay | Admitting: Emergency Medicine

## 2018-02-08 DIAGNOSIS — R42 Dizziness and giddiness: Secondary | ICD-10-CM | POA: Diagnosis not present

## 2018-02-08 DIAGNOSIS — R0789 Other chest pain: Secondary | ICD-10-CM | POA: Insufficient documentation

## 2018-02-08 DIAGNOSIS — I10 Essential (primary) hypertension: Secondary | ICD-10-CM | POA: Diagnosis not present

## 2018-02-08 DIAGNOSIS — Z79899 Other long term (current) drug therapy: Secondary | ICD-10-CM | POA: Insufficient documentation

## 2018-02-08 LAB — CBC
HCT: 43.5 % (ref 39.0–52.0)
Hemoglobin: 14.8 g/dL (ref 13.0–17.0)
MCH: 27.6 pg (ref 26.0–34.0)
MCHC: 34 g/dL (ref 30.0–36.0)
MCV: 81 fL (ref 78.0–100.0)
Platelets: 323 10*3/uL (ref 150–400)
RBC: 5.37 MIL/uL (ref 4.22–5.81)
RDW: 13.5 % (ref 11.5–15.5)
WBC: 6.5 10*3/uL (ref 4.0–10.5)

## 2018-02-08 LAB — I-STAT TROPONIN, ED
TROPONIN I, POC: 0 ng/mL (ref 0.00–0.08)
TROPONIN I, POC: 0 ng/mL (ref 0.00–0.08)

## 2018-02-08 LAB — BASIC METABOLIC PANEL
Anion gap: 12 (ref 5–15)
BUN: 10 mg/dL (ref 6–20)
CALCIUM: 9.6 mg/dL (ref 8.9–10.3)
CHLORIDE: 105 mmol/L (ref 101–111)
CO2: 25 mmol/L (ref 22–32)
CREATININE: 1.28 mg/dL — AB (ref 0.61–1.24)
GFR calc non Af Amer: >60 mL/min (ref >60)
Glucose, Bld: 99 mg/dL (ref 65–99)
Potassium: 3.3 mmol/L — ABNORMAL LOW (ref 3.5–5.1)
Sodium: 142 mmol/L (ref 135–145)

## 2018-02-08 LAB — D-DIMER, QUANTITATIVE (NOT AT ARMC)

## 2018-02-08 MED ORDER — MECLIZINE HCL 25 MG PO TABS
25.0000 mg | ORAL_TABLET | Freq: Once | ORAL | Status: AC
  Administered 2018-02-08: 25 mg via ORAL
  Filled 2018-02-08: qty 1

## 2018-02-08 MED ORDER — SODIUM CHLORIDE 0.9 % IV BOLUS
1000.0000 mL | Freq: Once | INTRAVENOUS | Status: AC
  Administered 2018-02-08: 1000 mL via INTRAVENOUS

## 2018-02-08 MED ORDER — POTASSIUM CHLORIDE CRYS ER 20 MEQ PO TBCR
40.0000 meq | EXTENDED_RELEASE_TABLET | Freq: Once | ORAL | Status: AC
  Administered 2018-02-08: 40 meq via ORAL
  Filled 2018-02-08: qty 2

## 2018-02-08 NOTE — ED Triage Notes (Signed)
Pt c/o left sided chest pains that radiated to left arm and left leg with dizziness that started last night. "feels like I am floating in air and gets worse when I lay down". Pt denies n/v. Reports dry cough

## 2018-02-08 NOTE — Discharge Instructions (Signed)
If you develop worsening chest pain or your chest pain does not go away or if you develop persistent dizziness or any new or concerning symptoms such as vomiting, headache, or weakness/numbness in your arms or legs then return to the ER for evaluation.
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
## 2018-02-08 NOTE — ED Notes (Signed)
ED Provider at bedside. [place] 

## 2018-03-09 ENCOUNTER — Other Ambulatory Visit: Payer: Self-pay | Admitting: Internal Medicine

## 2018-03-22 ENCOUNTER — Ambulatory Visit: Payer: BC Managed Care – PPO | Admitting: Diagnostic Neuroimaging

## 2018-04-05 ENCOUNTER — Encounter (HOSPITAL_COMMUNITY): Payer: Self-pay | Admitting: Emergency Medicine

## 2018-04-05 ENCOUNTER — Other Ambulatory Visit: Payer: Self-pay

## 2018-04-05 ENCOUNTER — Emergency Department (HOSPITAL_COMMUNITY)
Admission: EM | Admit: 2018-04-05 | Discharge: 2018-04-05 | Disposition: A | Discharge status: Home / Self Care | Payer: BC Managed Care – PPO | Attending: Emergency Medicine | Admitting: Emergency Medicine

## 2018-04-05 DIAGNOSIS — R1031 Right lower quadrant pain: Secondary | ICD-10-CM | POA: Diagnosis not present

## 2018-04-05 DIAGNOSIS — Z79899 Other long term (current) drug therapy: Secondary | ICD-10-CM | POA: Insufficient documentation

## 2018-04-05 DIAGNOSIS — R109 Unspecified abdominal pain: Secondary | ICD-10-CM | POA: Diagnosis present

## 2018-04-05 DIAGNOSIS — R21 Rash and other nonspecific skin eruption: Secondary | ICD-10-CM | POA: Insufficient documentation

## 2018-04-05 DIAGNOSIS — R110 Nausea: Secondary | ICD-10-CM | POA: Diagnosis not present

## 2018-04-05 DIAGNOSIS — I10 Essential (primary) hypertension: Secondary | ICD-10-CM | POA: Insufficient documentation

## 2018-04-05 DIAGNOSIS — R1011 Right upper quadrant pain: Secondary | ICD-10-CM

## 2018-04-05 LAB — COMPREHENSIVE METABOLIC PANEL
ALT: 24 U/L (ref 0–44)
ANION GAP: 11 (ref 5–15)
AST: 22 U/L (ref 15–41)
Albumin: 4.2 g/dL (ref 3.5–5.0)
Alkaline Phosphatase: 95 U/L (ref 38–126)
BILIRUBIN TOTAL: 0.7 mg/dL (ref 0.3–1.2)
BUN: 9 mg/dL (ref 6–20)
CALCIUM: 9.3 mg/dL (ref 8.9–10.3)
CO2: 25 mmol/L (ref 22–32)
Chloride: 102 mmol/L (ref 98–111)
Creatinine, Ser: 1.16 mg/dL (ref 0.61–1.24)
GFR calc non Af Amer: >60 mL/min (ref >60)
Glucose, Bld: 122 mg/dL — ABNORMAL HIGH (ref 70–99)
Potassium: 4.3 mmol/L (ref 3.5–5.1)
SODIUM: 138 mmol/L (ref 135–145)
TOTAL PROTEIN: 7.5 g/dL (ref 6.5–8.1)

## 2018-04-05 LAB — CBC WITH DIFFERENTIAL/PLATELET
Abs Immature Granulocytes: 0.1 10*3/uL (ref 0.0–0.1)
Basophils Absolute: 0 10*3/uL (ref 0.0–0.1)
Basophils Relative: 0 %
EOS ABS: 0 10*3/uL (ref 0.0–0.7)
EOS PCT: 0 %
HEMATOCRIT: 47.3 % (ref 39.0–52.0)
Hemoglobin: 15.2 g/dL (ref 13.0–17.0)
Immature Granulocytes: 1 %
LYMPHS ABS: 1.2 10*3/uL (ref 0.7–4.0)
Lymphocytes Relative: 8 %
MCH: 26 pg (ref 26.0–34.0)
MCHC: 32.1 g/dL (ref 30.0–36.0)
MCV: 81 fL (ref 78.0–100.0)
Monocytes Absolute: 0.7 10*3/uL (ref 0.1–1.0)
Monocytes Relative: 4 %
Neutro Abs: 13.4 10*3/uL — ABNORMAL HIGH (ref 1.7–7.7)
Neutrophils Relative %: 87 %
Platelets: 332 10*3/uL (ref 150–400)
RBC: 5.84 MIL/uL — AB (ref 4.22–5.81)
RDW: 13.2 % (ref 11.5–15.5)
WBC: 15.4 10*3/uL — ABNORMAL HIGH (ref 4.0–10.5)

## 2018-04-05 LAB — URINALYSIS, ROUTINE W REFLEX MICROSCOPIC
BACTERIA UA: NONE SEEN
Bilirubin Urine: NEGATIVE
GLUCOSE, UA: NEGATIVE mg/dL
KETONES UR: NEGATIVE mg/dL
LEUKOCYTES UA: NEGATIVE
NITRITE: NEGATIVE
PH: 6 (ref 5.0–8.0)
Protein, ur: NEGATIVE mg/dL
SPECIFIC GRAVITY, URINE: 1.006 (ref 1.005–1.030)

## 2018-04-05 LAB — LIPASE, BLOOD: Lipase: 25 U/L (ref 11–51)

## 2018-04-05 MED ORDER — ONDANSETRON HCL 4 MG/2ML IJ SOLN
4.0000 mg | Freq: Once | INTRAMUSCULAR | Status: AC
  Administered 2018-04-05: 4 mg via INTRAVENOUS
  Filled 2018-04-05: qty 2

## 2018-04-05 MED ORDER — SODIUM CHLORIDE 0.9 % IV BOLUS
1000.0000 mL | Freq: Once | INTRAVENOUS | Status: AC
  Administered 2018-04-05: 1000 mL via INTRAVENOUS

## 2018-04-05 NOTE — ED Provider Notes (Signed)
[facility] EMERGENCY DEPARTMENT Provider Note   CSN: [id number] Arrival date & time: 04/05/18  4098   History   Chief Complaint Chief Complaint  Patient presents with  . Abdominal Pain  . Possible Medication reaction    HPI Dylan Guerrero is a 42 y.o. male with a history of HTN, vertigo, migraines with aura, nerve root disorder who presents with a four day history of rash and two day history of abdominal pain. Patient reports that he was seen by his [facility] PCP on Friday, June 28th for a rash that began abruptly on both arms. He was prescribed Triamcinolone. The rash did not resolve with application of topical medication and patient called PCP on Sunday, June 30th to follow-up. His PCP then prescribed prednisone and doxycycline. After taking these new medications, the patient began to have left-sided abdominal pain and "hotness that radiates from back to chest." He then awoke this morning with "cold and tingly fingers" and lightheadedness. He then sought medical care in our emergency department.   Patient reports new onset constipation after taking Prednisone and doxycycline. His rash has not improved or worsened. Patient denies nausea, vomiting, diarrhea, urinary issues, vision changes, fevers, and headache. Patient does report going to [facility] last week. He denies being bit by insects including ticks and does not endorse any other environmental exposures.    Past Medical History:  Diagnosis Date  . Abdominal pain   . Allergy   . Arthritis   . Carpal tunnel syndrome   . GERD (gastroesophageal reflux disease)   . Headache(784.0)   . Hernia   . Hypertension   . Hypokalemia   . Infection of the inner ear   . Migraine   . Sinus infection   . Sleep apnea   . Spider bite     Patient Active Problem List   Diagnosis Date Noted  . Morbid obesity due to excess calories (HCC) 04/11/2017  . Essential hypertension 04/11/2017  . DOE (dyspnea on exertion) 04/09/2017   . Migraine with aura 01/09/2013  . Umbilical pain 10/22/2011    Past Surgical History:  Procedure Laterality Date  . APPENDECTOMY  1984  . HERNIA REPAIR  01/30/2011   Right Inguinal Hernia repair with mesh  . HERNIA REPAIR  01/30/2011   Umbilical Hernia repair with meash       Home Medications    Prior to Admission medications   Medication Sig Start Date End Date Taking? Authorizing Provider  acetaminophen (TYLENOL) 500 MG tablet Take 500 mg by mouth every 6 (six) hours as needed (for pain).     [provider]  albuterol (PROVENTIL HFA;VENTOLIN HFA) 108 (90 Base) MCG/ACT inhaler Inhale 2 puffs into the lungs every 6 (six) hours as needed for wheezing or shortness of breath.    [provider]  amLODipine (NORVASC) 10 MG tablet Take 10 mg by mouth daily.    [provider]  bisoprolol (ZEBETA) 5 MG tablet TAKE 1 TABLET BY MOUTH ONCE DAILY 03/09/18   Nyoka Cowden, MD  colchicine 0.6 MG tablet Take 0.6 mg by mouth 2 (two) times daily as needed (for gout flares).     [provider]  cycloSPORINE (RESTASIS) 0.05 % ophthalmic emulsion Place 1 drop into both eyes 2 (two) times daily.    [provider]  fluticasone (FLONASE) 50 MCG/ACT nasal spray Place 1 spray into both nostrils daily as needed for allergies or rhinitis.     [provider]  hydrochlorothiazide (HYDRODIURIL) 25 MG tablet Take 25 mg by mouth daily.    [provider]  hydrocortisone cream 1 % Apply 1 application topically 2 (two) times daily as needed for itching (RASH).    [provider]  linaclotide (LINZESS) 145 MCG CAPS capsule Take 145 mcg by mouth daily.    [provider]  loratadine (CLARITIN) 10 MG tablet Take 10 mg by mouth daily.    [provider]  meclizine (ANTIVERT) 25 MG tablet Take 25 mg by mouth daily as needed for dizziness.     [provider]  meloxicam (MOBIC) 15 MG tablet Take 15 mg by mouth daily as  needed for pain.     [provider]  methocarbamol (ROBAXIN) 750 MG tablet Take 750 mg by mouth daily as needed for muscle spasms.     [provider]  Multiple Vitamin (MULTIVITAMIN WITH MINERALS) TABS tablet Take 1 tablet by mouth daily.    [provider]  omeprazole (PRILOSEC) 40 MG capsule Take 1 capsule (40 mg total) by mouth daily. 04/12/17   Nyoka Cowden, MD  UNABLE TO FIND CPAP at bedtime    [provider]    Family History Family History  Problem Relation Age of Onset  . Scleroderma Mother   . Diabetes Unknown   . Hypertension Unknown     Social History Social History   Tobacco Use  . Smoking status: Never Smoker  . Smokeless tobacco: Never Used  Substance Use Topics  . Alcohol use: No    Alcohol/week: 0.0 oz    Comment: quit 10/05/2009  . Drug use: No     Allergies   Ibuprofen; Ketoprofen; Topiramate; and Adhesive [tape]   Review of Systems Review of Systems  Constitutional: Negative for activity change, appetite change and fever.  HENT: Negative for congestion.   Eyes: Negative for photophobia.  Respiratory: Negative for cough, chest tightness and shortness of breath.   Cardiovascular: Negative for chest pain.  Gastrointestinal: Positive for abdominal pain and nausea. Negative for abdominal distention, constipation, diarrhea and vomiting.  Genitourinary: Negative for difficulty urinating, dysuria and frequency.  Skin: Positive for rash.  Neurological: Negative for dizziness, weakness and headaches.    Physical Exam Updated Vital Signs BP (!) 129/111 (BP Location: Right Arm)   Pulse 96   Temp 98.6 F (37 C) (Oral)   Resp 20   SpO2 100%   Physical Exam  Constitutional: He is oriented to person, place, and time. He appears well-developed and well-nourished.  HENT:  Head: Normocephalic and atraumatic.  Eyes: Pupils are equal, round, and reactive to light. EOM are normal.  Cardiovascular: Normal rate, regular  rhythm, normal heart sounds and intact distal pulses.  Pulmonary/Chest: Effort normal and breath sounds normal.  Abdominal: Soft. Normal appearance. Bowel sounds are decreased.  Tenderness to palpation of right upper and right lower quadrant pain.  Neurological: He is alert and oriented to person, place, and time.  Skin:  3-1 cm dry  well-circumscribed erythematous macules on left arm. 2-1 cm dry well-circumscribed erythematous macules on right arm. 2-1 cm dry well-circumscribed erythematous macules on right upper chest.   Psychiatric: He has a normal mood and affect.     ED Treatments / Results  Labs (all labs ordered are listed, but only abnormal results are displayed) Labs Reviewed  CBC WITH DIFFERENTIAL/PLATELET  URINALYSIS, DIPSTICK ONLY  LIPASE, BLOOD  COMPREHENSIVE METABOLIC PANEL    EKG None  Radiology No results found.  Procedures  Procedures (including critical care time)  Medications Ordered in ED Medications  sodium chloride 0.9 % bolus 1,000 mL (has no administration in time range)  ondansetron (ZOFRAN) injection 4 mg (has no administration in time range)    Initial Impression / Assessment and Plan / ED Course  I have reviewed the triage vital signs and the nursing notes.  Pertinent labs & imaging results that were available during my care of the patient were reviewed by me and considered in my medical decision making (see chart for details).  Assessment and Plan: Dylan Guerrero is a 42 y.o. male with a history of HTN, vertigo, migraines with aura, nerve root disorder who presents with a four day history of rash and two day history of abdominal pain.   #Rash:  - Likely caused by bedbug or mite bites during recent trip to the beach. Rash has not worsened since onset. - Lipase, UA, CMP unremarkable. CBC showed leukocytosis of 15.4  Likely 2/2 prednisone use. Patient is not demonstrating any other signs of infection (no fever, chills, night sweats, otherwise  unremarkable lab work) - Recommend continuation of triamcinolone as needed. - Unsure why PCP prescribed prednisone and doxycycline. Given lack of systemic symptoms and unremarkable lab work, will recommend discontinuing Prednisone and Doxycylcline. - Follow-up with PCP if rash worsens.  #Abdominal pain 2/2 Constipation: - Patient has been constipated since taking Prednisone and Doxycycline. This is likely the cause of his abdominal pain given decreased bowel sounds.  - UA, CMP, Lipase unremarkable. - Advised to discontinue prednisone and doxycycline per above - Recommended to use miralax as needed for constipation.  - Follow-up with PCP if abdominal pain worsens or constipation continues.  #Leukocytosis - WBC 15.4 - No other systemic symptoms (negative fever, chills, otherwise unremarkable CBC, CMP, UA) - Most likely secondary to prednisone use.  - Discontinue prednisone per above.  Final Clinical Impressions(s) / ED Diagnoses   Final diagnoses:  None    ED Discharge Orders    None       Synetta ShadowPrince, Jovontae Banko M, MD 04/06/18 1359    Gwyneth SproutPlunkett, Whitney, MD 04/07/18 [id number]    Gwyneth SproutPlunkett, Whitney, MD 04/07/18 [phone]

## 2018-04-05 NOTE — ED Triage Notes (Signed)
[facility], given cream and if not clear by Sunday to call back. Starting Sunday pt reports feeling sluggish and dehydrated. Patient started taking doxy and prednisone Monday morning and reports feeling more tired. Having abdominal cramping and feeling shaky. Denies any urinary issues or diarrhea or emesis.

## 2018-04-05 NOTE — Discharge Instructions (Addendum)
Please discontinue use of Prednisone and doxycycline. Please use triamcinolone as needed for rash. You can also use benadryl if you experience itching.   Follow-up with PCP if rash or abdominal pain worsens. For mild abdominal pain, call your primary care doctor first. If the pain is sudden, severe or does not ease within 30 minutes, seek emergency medical care.  Also return to emergency department if you experience any of the following: Fever greater than 101 Nausea and vomiting Trouble breathing Blood in your stool Swelling in the area of pain Loss of appetite Weight loss Constipation Pain that lasts more than 24 hours

## 2018-04-06 IMAGING — CR DG CHEST 2V
2 series · 2 of 2 positions shown · non-contrast
Comparison: 03/31/2017, 02/12/2017 and earlier.

CLINICAL DATA: One day history of mid chest pain and shortness of
breath. Current history of hypertension. Nonsmoker.

EXAM:
CHEST  2 VIEW

[chest pa]
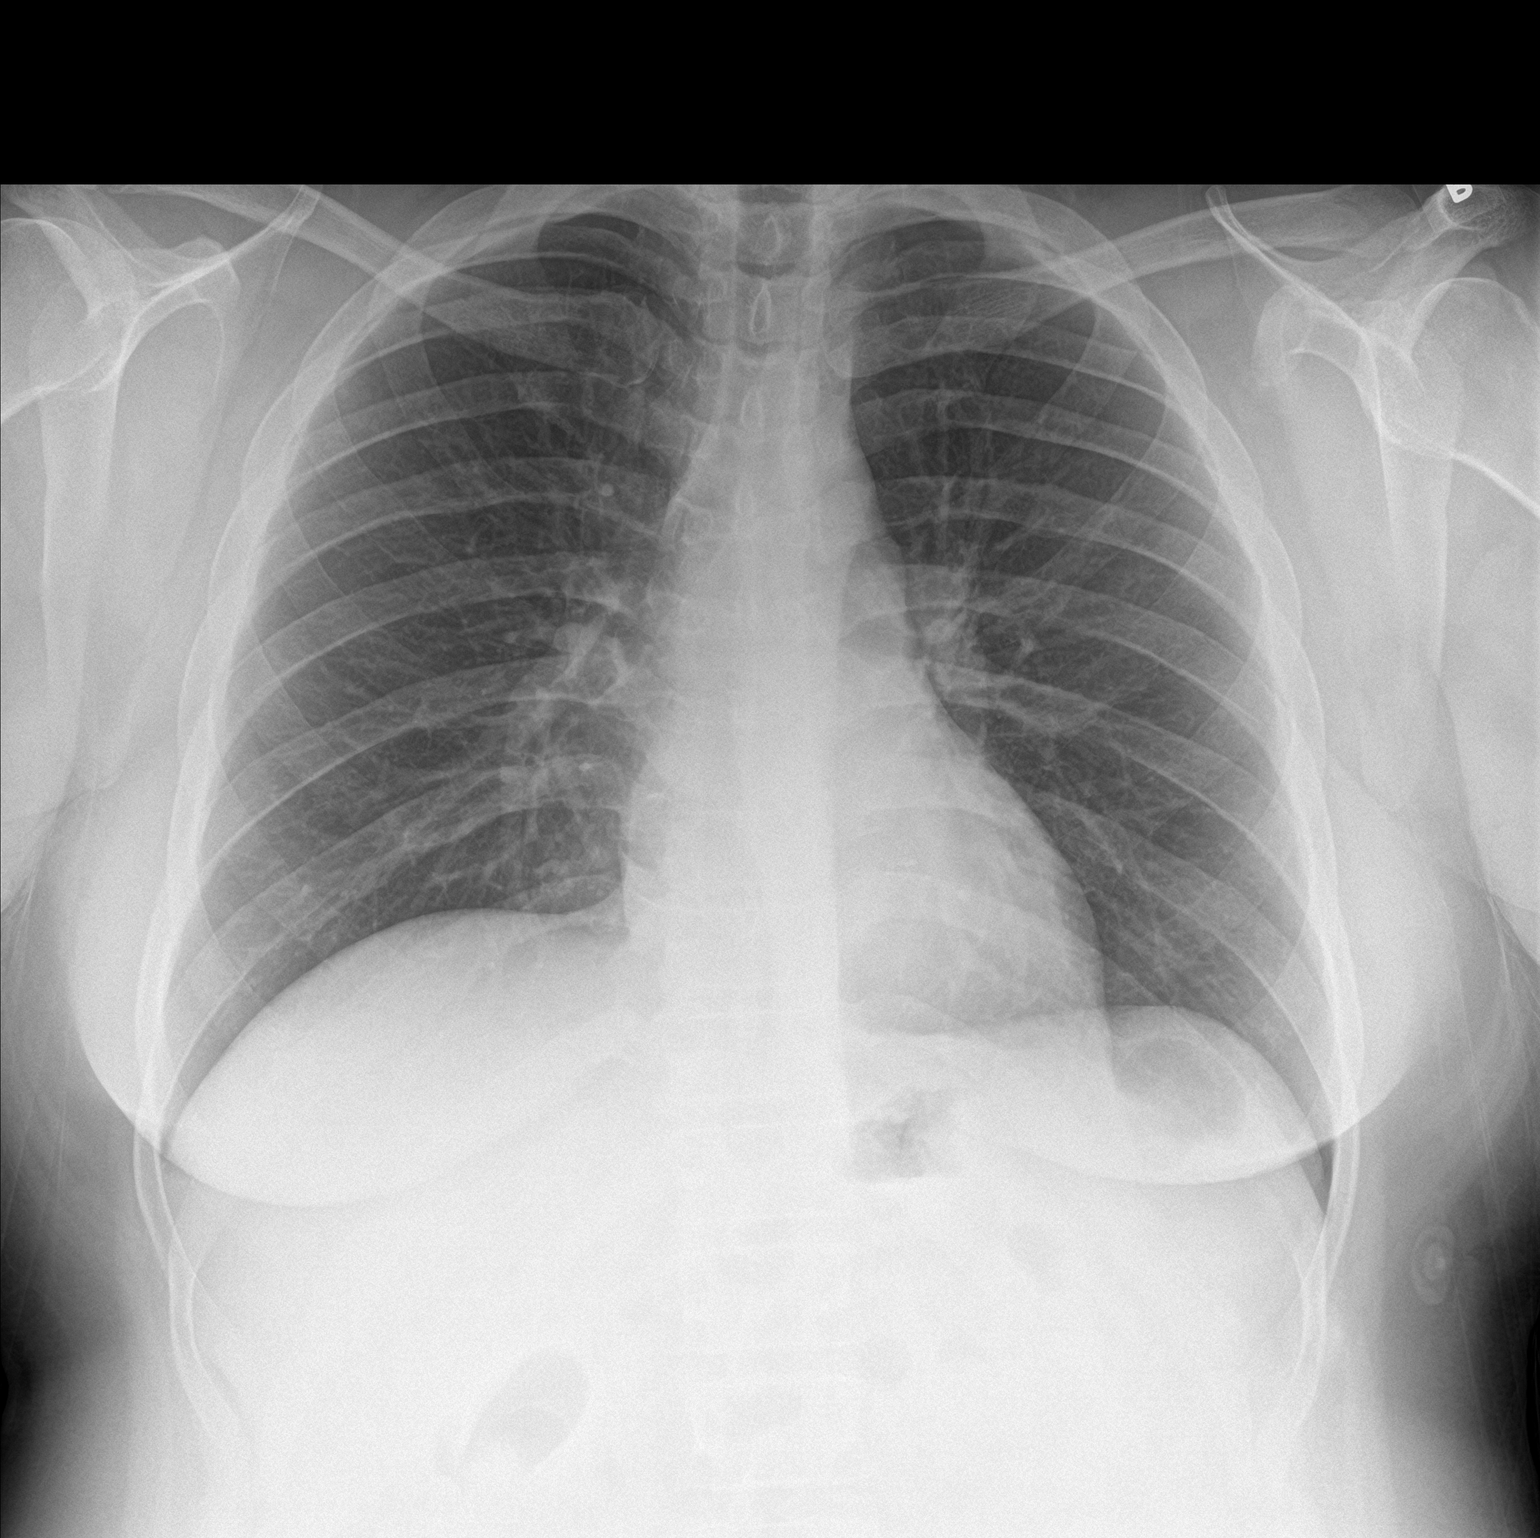

[chest lat]
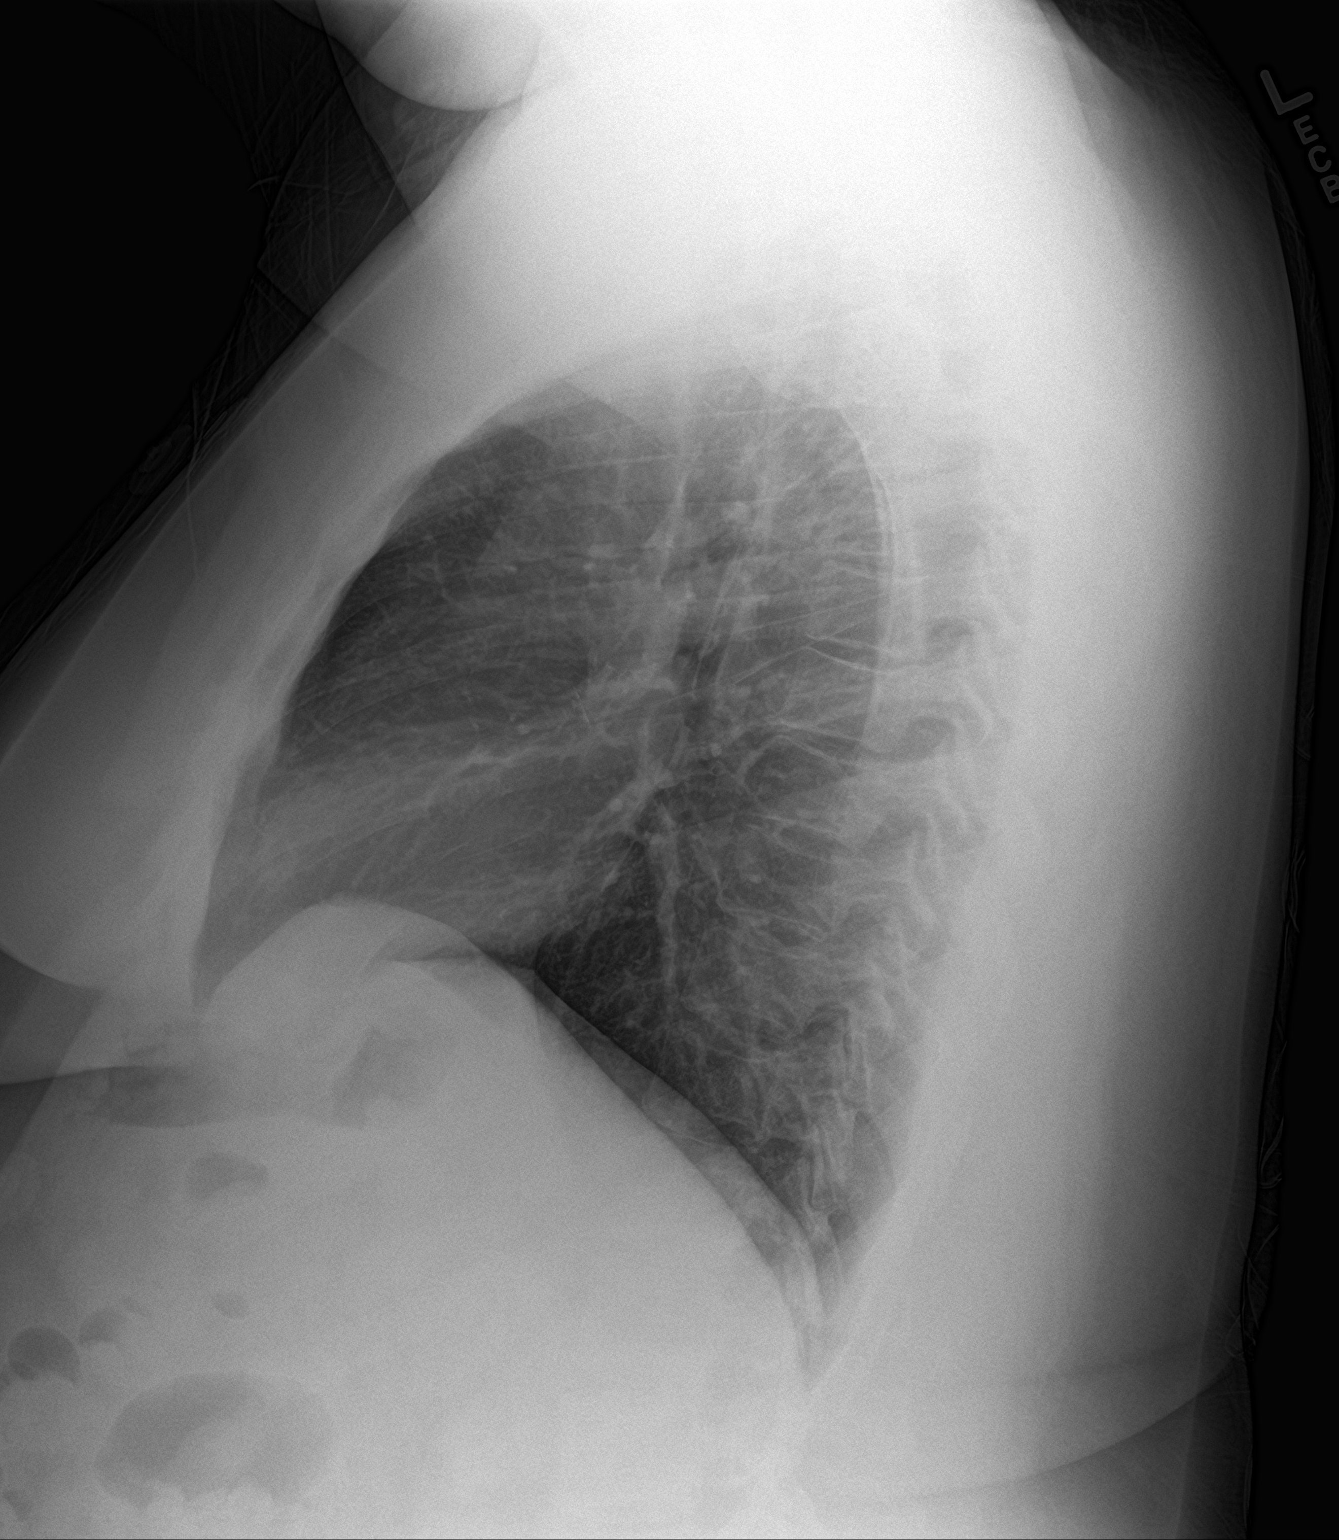

[2 of 2 positions shown; findings below may reference images not displayed]

FINDINGS: Cardiomediastinal silhouette unremarkable, unchanged. Lungs clear.
Bronchovascular markings normal. Pulmonary vascularity normal. No
visible pleural effusions. No pneumothorax. Visualized bony thorax
intact. No interval change dating back to 7957.
IMPRESSION: Normal examination.

## 2018-04-08 ENCOUNTER — Other Ambulatory Visit: Payer: Self-pay | Admitting: Internal Medicine

## 2018-04-27 ENCOUNTER — Other Ambulatory Visit: Payer: Self-pay

## 2018-04-27 ENCOUNTER — Emergency Department (HOSPITAL_COMMUNITY): Payer: BC Managed Care – PPO

## 2018-04-27 ENCOUNTER — Encounter (HOSPITAL_COMMUNITY): Payer: Self-pay | Admitting: Emergency Medicine

## 2018-04-27 ENCOUNTER — Emergency Department (HOSPITAL_COMMUNITY)
Admission: EM | Admit: 2018-04-27 | Discharge: 2018-04-27 | Disposition: A | Discharge status: Home / Self Care | Payer: BC Managed Care – PPO | Attending: Emergency Medicine | Admitting: Emergency Medicine

## 2018-04-27 DIAGNOSIS — I10 Essential (primary) hypertension: Secondary | ICD-10-CM | POA: Diagnosis not present

## 2018-04-27 DIAGNOSIS — R0789 Other chest pain: Secondary | ICD-10-CM | POA: Insufficient documentation

## 2018-04-27 DIAGNOSIS — Z79899 Other long term (current) drug therapy: Secondary | ICD-10-CM | POA: Diagnosis not present

## 2018-04-27 LAB — BASIC METABOLIC PANEL
Anion gap: 12 (ref 5–15)
BUN: 10 mg/dL (ref 6–20)
CO2: 26 mmol/L (ref 22–32)
CREATININE: 1.27 mg/dL — AB (ref 0.61–1.24)
Calcium: 9.7 mg/dL (ref 8.9–10.3)
Chloride: 102 mmol/L (ref 98–111)
GFR calc non Af Amer: >60 mL/min (ref >60)
Glucose, Bld: 98 mg/dL (ref 70–99)
POTASSIUM: 3.3 mmol/L — AB (ref 3.5–5.1)
SODIUM: 140 mmol/L (ref 135–145)

## 2018-04-27 LAB — CBC
HCT: 48.8 % (ref 39.0–52.0)
Hemoglobin: 15.3 g/dL (ref 13.0–17.0)
MCH: 25.8 pg — AB (ref 26.0–34.0)
MCHC: 31.4 g/dL (ref 30.0–36.0)
MCV: 82.3 fL (ref 78.0–100.0)
PLATELETS: 326 10*3/uL (ref 150–400)
RBC: 5.93 MIL/uL — AB (ref 4.22–5.81)
RDW: 13.2 % (ref 11.5–15.5)
WBC: 5.1 10*3/uL (ref 4.0–10.5)

## 2018-04-27 LAB — I-STAT TROPONIN, ED
Troponin i, poc: 0 ng/mL (ref 0.00–0.08)
Troponin i, poc: 0 ng/mL (ref 0.00–0.08)

## 2018-04-27 MED ORDER — POTASSIUM CHLORIDE CRYS ER 20 MEQ PO TBCR
40.0000 meq | EXTENDED_RELEASE_TABLET | Freq: Once | ORAL | Status: AC
  Administered 2018-04-27: 40 meq via ORAL
  Filled 2018-04-27: qty 2

## 2018-04-27 MED ORDER — ASPIRIN 81 MG PO CHEW
324.0000 mg | CHEWABLE_TABLET | Freq: Once | ORAL | Status: AC
  Administered 2018-04-27: 324 mg via ORAL
  Filled 2018-04-27: qty 4

## 2018-04-27 NOTE — ED Notes (Signed)
Pt orthostatics were within normal range.

## 2018-04-27 NOTE — ED Triage Notes (Signed)
Pt states he has left sided chest pain with radiated pain into both arms, with numbness and tingling to both arms since Sunday that has been constant. Pain is currently an 8/10. Pt also endorses dizziness.

## 2018-04-27 NOTE — ED Notes (Signed)
ED Provider at bedside. 
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
## 2018-04-27 NOTE — Discharge Instructions (Signed)
Please follow up with your primary care provider within 5-7 days for re-evaluation of your symptoms. If you do not have a primary care provider, information for a healthcare clinic has been provided for you to make arrangements for follow up care. Please return to the emergency department for any new or worsening symptoms including chest pain, shortness of breath, headaches, lightheadedness, numbness or weakness.

## 2018-04-27 NOTE — ED Notes (Signed)
Pt verbalized understanding of discharge instructions and denies any further questions at this time.   

## 2018-05-10 ENCOUNTER — Encounter (HOSPITAL_COMMUNITY): Payer: Self-pay

## 2018-05-10 ENCOUNTER — Other Ambulatory Visit: Payer: Self-pay

## 2018-05-10 ENCOUNTER — Emergency Department (HOSPITAL_COMMUNITY)
Admission: EM | Admit: 2018-05-10 | Discharge: 2018-05-10 | Disposition: A | Discharge status: Home / Self Care | Payer: BC Managed Care – PPO | Attending: Emergency Medicine | Admitting: Emergency Medicine

## 2018-05-10 DIAGNOSIS — Z79899 Other long term (current) drug therapy: Secondary | ICD-10-CM | POA: Diagnosis not present

## 2018-05-10 DIAGNOSIS — R531 Weakness: Secondary | ICD-10-CM | POA: Diagnosis not present

## 2018-05-10 DIAGNOSIS — I10 Essential (primary) hypertension: Secondary | ICD-10-CM | POA: Insufficient documentation

## 2018-05-10 DIAGNOSIS — R42 Dizziness and giddiness: Secondary | ICD-10-CM

## 2018-05-10 DIAGNOSIS — R0600 Dyspnea, unspecified: Secondary | ICD-10-CM | POA: Diagnosis not present

## 2018-05-10 DIAGNOSIS — R0602 Shortness of breath: Secondary | ICD-10-CM | POA: Diagnosis present

## 2018-05-10 LAB — URINALYSIS, ROUTINE W REFLEX MICROSCOPIC
BILIRUBIN URINE: NEGATIVE
GLUCOSE, UA: NEGATIVE mg/dL
HGB URINE DIPSTICK: NEGATIVE
Ketones, ur: NEGATIVE mg/dL
Leukocytes, UA: NEGATIVE
Nitrite: NEGATIVE
PROTEIN: NEGATIVE mg/dL
Specific Gravity, Urine: 1.004 — ABNORMAL LOW (ref 1.005–1.030)
pH: 8 (ref 5.0–8.0)

## 2018-05-10 LAB — CBC
HCT: 45.8 % (ref 39.0–52.0)
Hemoglobin: 15.4 g/dL (ref 13.0–17.0)
MCH: 26.9 pg (ref 26.0–34.0)
MCHC: 33.6 g/dL (ref 30.0–36.0)
MCV: 79.9 fL (ref 78.0–100.0)
Platelets: 326 10*3/uL (ref 150–400)
RBC: 5.73 MIL/uL (ref 4.22–5.81)
RDW: 13.4 % (ref 11.5–15.5)
WBC: 5.6 10*3/uL (ref 4.0–10.5)

## 2018-05-10 LAB — TROPONIN I: Troponin I: <0.03 ng/mL (ref <0.03)

## 2018-05-10 LAB — BASIC METABOLIC PANEL
Anion gap: 12 (ref 5–15)
BUN: 14 mg/dL (ref 6–20)
CHLORIDE: 103 mmol/L (ref 98–111)
CO2: 28 mmol/L (ref 22–32)
CREATININE: 1.34 mg/dL — AB (ref 0.61–1.24)
Calcium: 9.6 mg/dL (ref 8.9–10.3)
GFR calc Af Amer: >60 mL/min (ref >60)
GFR calc non Af Amer: >60 mL/min (ref >60)
GLUCOSE: 107 mg/dL — AB (ref 70–99)
Potassium: 3.3 mmol/L — ABNORMAL LOW (ref 3.5–5.1)
Sodium: 143 mmol/L (ref 135–145)

## 2018-05-10 LAB — MAGNESIUM: Magnesium: 2.3 mg/dL (ref 1.7–2.4)

## 2018-05-10 MED ORDER — LACTATED RINGERS IV BOLUS
1000.0000 mL | Freq: Once | INTRAVENOUS | Status: AC
  Administered 2018-05-10: 1000 mL via INTRAVENOUS

## 2018-05-10 MED ORDER — POTASSIUM CHLORIDE CRYS ER 20 MEQ PO TBCR
40.0000 meq | EXTENDED_RELEASE_TABLET | Freq: Once | ORAL | Status: AC
  Administered 2018-05-10: 40 meq via ORAL
  Filled 2018-05-10: qty 2

## 2018-05-10 NOTE — Discharge Instructions (Addendum)
We saw in the ER for shortness of breath, extremity weakness and dizziness. The work-up in the ER shows normal results. We are unsure as to what the underlying process is, and we recommend that you continue with the plan to follow-up with neurology on 27th of this month and with your PCP as soon as possible.

## 2018-05-10 NOTE — ED Triage Notes (Addendum)
Pt states that he has had SHOB since last night, as well as dizziness. Pt states he has also had numbness and tingling. [facility]. Pt able to talk in complete sentences. [facility] potassium [facility].
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
## 2018-05-11 DIAGNOSIS — Z0271 Encounter for disability determination: Secondary | ICD-10-CM

## 2018-05-16 ENCOUNTER — Emergency Department (HOSPITAL_COMMUNITY)
Admission: EM | Admit: 2018-05-16 | Discharge: 2018-05-17 | Disposition: A | Discharge status: Home / Self Care | Payer: BC Managed Care – PPO | Attending: Emergency Medicine | Admitting: Emergency Medicine

## 2018-05-16 ENCOUNTER — Encounter (HOSPITAL_COMMUNITY): Payer: Self-pay | Admitting: Emergency Medicine

## 2018-05-16 ENCOUNTER — Other Ambulatory Visit: Payer: Self-pay | Admitting: Internal Medicine

## 2018-05-16 DIAGNOSIS — R0602 Shortness of breath: Secondary | ICD-10-CM | POA: Diagnosis present

## 2018-05-16 DIAGNOSIS — R072 Precordial pain: Secondary | ICD-10-CM | POA: Insufficient documentation

## 2018-05-16 DIAGNOSIS — I10 Essential (primary) hypertension: Secondary | ICD-10-CM | POA: Diagnosis not present

## 2018-05-16 DIAGNOSIS — Z79899 Other long term (current) drug therapy: Secondary | ICD-10-CM | POA: Insufficient documentation

## 2018-05-16 NOTE — ED Triage Notes (Signed)
[place] en[place] to come to ER for same symptoms but got worse and called 911; [facility] reports pt was enroute to hospital for sob / anxiety / bilat hand tingling; pt has hx of HTN and panic attacks; EMS reported stable VS and clear lung sounds

## 2018-05-17 ENCOUNTER — Emergency Department (HOSPITAL_COMMUNITY): Payer: BC Managed Care – PPO

## 2018-05-17 ENCOUNTER — Encounter (HOSPITAL_COMMUNITY): Payer: Self-pay

## 2018-05-17 ENCOUNTER — Other Ambulatory Visit: Payer: Self-pay

## 2018-05-17 ENCOUNTER — Emergency Department (HOSPITAL_COMMUNITY)
Admission: EM | Admit: 2018-05-17 | Discharge: 2018-05-18 | Disposition: A | Discharge status: Home / Self Care | Payer: BC Managed Care – PPO | Source: Home / Self Care | Attending: Emergency Medicine | Admitting: Emergency Medicine

## 2018-05-17 DIAGNOSIS — I10 Essential (primary) hypertension: Secondary | ICD-10-CM

## 2018-05-17 DIAGNOSIS — Z79899 Other long term (current) drug therapy: Secondary | ICD-10-CM

## 2018-05-17 DIAGNOSIS — R079 Chest pain, unspecified: Secondary | ICD-10-CM

## 2018-05-17 LAB — BASIC METABOLIC PANEL
Anion gap: 13 (ref 5–15)
BUN: 9 mg/dL (ref 6–20)
CALCIUM: 9.4 mg/dL (ref 8.9–10.3)
CO2: 25 mmol/L (ref 22–32)
CREATININE: 1.31 mg/dL — AB (ref 0.61–1.24)
Chloride: 102 mmol/L (ref 98–111)
GFR calc Af Amer: >60 mL/min (ref >60)
GFR calc non Af Amer: >60 mL/min (ref >60)
Glucose, Bld: 92 mg/dL (ref 70–99)
POTASSIUM: 3.7 mmol/L (ref 3.5–5.1)
SODIUM: 140 mmol/L (ref 135–145)

## 2018-05-17 LAB — I-STAT TROPONIN, ED
TROPONIN I, POC: 0 ng/mL (ref 0.00–0.08)
Troponin i, poc: 0 ng/mL (ref 0.00–0.08)
Troponin i, poc: 0 ng/mL (ref 0.00–0.08)
Troponin i, poc: 0 ng/mL (ref 0.00–0.08)

## 2018-05-17 LAB — I-STAT CHEM 8, ED
BUN: 11 mg/dL (ref 6–20)
CALCIUM ION: 1.08 mmol/L — AB (ref 1.15–1.40)
CHLORIDE: 101 mmol/L (ref 98–111)
Creatinine, Ser: 1.4 mg/dL — ABNORMAL HIGH (ref 0.61–1.24)
Glucose, Bld: 102 mg/dL — ABNORMAL HIGH (ref 70–99)
HCT: 46 % (ref 39.0–52.0)
Hemoglobin: 15.6 g/dL (ref 13.0–17.0)
Potassium: 3.5 mmol/L (ref 3.5–5.1)
SODIUM: 141 mmol/L (ref 135–145)
TCO2: 26 mmol/L (ref 22–32)

## 2018-05-17 LAB — CBC
HCT: 48.1 % (ref 39.0–52.0)
HEMOGLOBIN: 15.3 g/dL (ref 13.0–17.0)
MCH: 25.9 pg — ABNORMAL LOW (ref 26.0–34.0)
MCHC: 31.8 g/dL (ref 30.0–36.0)
MCV: 81.5 fL (ref 78.0–100.0)
Platelets: 331 10*3/uL (ref 150–400)
RBC: 5.9 MIL/uL — AB (ref 4.22–5.81)
RDW: 13.4 % (ref 11.5–15.5)
WBC: 6.9 10*3/uL (ref 4.0–10.5)

## 2018-05-17 LAB — D-DIMER, QUANTITATIVE: D-Dimer, Quant: <0.27 ug/mL-FEU (ref 0.00–0.50)

## 2018-05-17 MED ORDER — LORAZEPAM 1 MG PO TABS
1.0000 mg | ORAL_TABLET | Freq: Once | ORAL | Status: AC
  Administered 2018-05-17: 1 mg via ORAL
  Filled 2018-05-17: qty 1

## 2018-05-17 MED ORDER — IOPAMIDOL (ISOVUE-370) INJECTION 76%
INTRAVENOUS | Status: AC
  Filled 2018-05-17: qty 100

## 2018-05-17 NOTE — ED Triage Notes (Addendum)
Pt presents with continued chest pain since being seen here for same last night.  Pt reports shortness of breath with pain and dry cough.   Pt reports we need to page Dr. Mikeal HawthorneGarba before workup is started.  (paged at 18:14).  Pt reports 4-5 hour bus ride 1 week prior to chest pain.  Dr. Mikeal HawthorneGarba called back, reports he will come down to admit pt if needed.

## 2018-05-17 NOTE — ED Notes (Signed)
Patient transported to X-ray 
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
## 2018-05-17 NOTE — ED Notes (Signed)
While ambulating, pt pulse was maintained around 100; O2 sat never fell below 96%
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
## 2018-05-17 NOTE — Discharge Instructions (Addendum)

## 2018-05-18 ENCOUNTER — Emergency Department (HOSPITAL_COMMUNITY): Payer: BC Managed Care – PPO

## 2018-05-18 ENCOUNTER — Encounter (HOSPITAL_COMMUNITY): Payer: Self-pay | Admitting: Emergency Medicine

## 2018-05-18 ENCOUNTER — Emergency Department (HOSPITAL_COMMUNITY)
Admission: EM | Admit: 2018-05-18 | Discharge: 2018-05-19 | Disposition: A | Discharge status: Other Acute Inpatient Hospital | Payer: BC Managed Care – PPO | Source: Home / Self Care | Attending: Emergency Medicine | Admitting: Emergency Medicine

## 2018-05-18 ENCOUNTER — Other Ambulatory Visit: Payer: Self-pay

## 2018-05-18 DIAGNOSIS — F322 Major depressive disorder, single episode, severe without psychotic features: Secondary | ICD-10-CM

## 2018-05-18 DIAGNOSIS — F419 Anxiety disorder, unspecified: Secondary | ICD-10-CM

## 2018-05-18 DIAGNOSIS — I10 Essential (primary) hypertension: Secondary | ICD-10-CM

## 2018-05-18 DIAGNOSIS — Z79899 Other long term (current) drug therapy: Secondary | ICD-10-CM | POA: Insufficient documentation

## 2018-05-18 DIAGNOSIS — R45851 Suicidal ideations: Secondary | ICD-10-CM | POA: Insufficient documentation

## 2018-05-18 LAB — RAPID URINE DRUG SCREEN, HOSP PERFORMED
Amphetamines: NOT DETECTED
BENZODIAZEPINES: NOT DETECTED
Barbiturates: NOT DETECTED
Cocaine: NOT DETECTED
Tetrahydrocannabinol: NOT DETECTED

## 2018-05-18 LAB — COMPREHENSIVE METABOLIC PANEL
ALT: 17 U/L (ref 0–44)
ANION GAP: 13 (ref 5–15)
AST: 23 U/L (ref 15–41)
Albumin: 4.9 g/dL (ref 3.5–5.0)
Alkaline Phosphatase: 113 U/L (ref 38–126)
BILIRUBIN TOTAL: 0.9 mg/dL (ref 0.3–1.2)
BUN: 10 mg/dL (ref 6–20)
CHLORIDE: 102 mmol/L (ref 98–111)
CO2: 28 mmol/L (ref 22–32)
Calcium: 10 mg/dL (ref 8.9–10.3)
Creatinine, Ser: 1.29 mg/dL — ABNORMAL HIGH (ref 0.61–1.24)
Glucose, Bld: 91 mg/dL (ref 70–99)
POTASSIUM: 3.9 mmol/L (ref 3.5–5.1)
Sodium: 143 mmol/L (ref 135–145)
TOTAL PROTEIN: 8.6 g/dL — AB (ref 6.5–8.1)

## 2018-05-18 LAB — ETHANOL: Alcohol, Ethyl (B): <10 mg/dL (ref <10)

## 2018-05-18 LAB — CBC
HCT: 46.7 % (ref 39.0–52.0)
Hemoglobin: 15.9 g/dL (ref 13.0–17.0)
MCH: 26.9 pg (ref 26.0–34.0)
MCHC: 34 g/dL (ref 30.0–36.0)
MCV: 79.2 fL (ref 78.0–100.0)
PLATELETS: 338 10*3/uL (ref 150–400)
RBC: 5.9 MIL/uL — ABNORMAL HIGH (ref 4.22–5.81)
RDW: 13.4 % (ref 11.5–15.5)
WBC: 6.2 10*3/uL (ref 4.0–10.5)

## 2018-05-18 LAB — SALICYLATE LEVEL

## 2018-05-18 LAB — ACETAMINOPHEN LEVEL: Acetaminophen (Tylenol), Serum: <10 ug/mL — ABNORMAL LOW (ref 10–30)

## 2018-05-18 MED ORDER — HYDROXYZINE HCL 25 MG PO TABS
25.0000 mg | ORAL_TABLET | Freq: Four times a day (QID) | ORAL | Status: DC | PRN
  Administered 2018-05-18: 25 mg via ORAL
  Filled 2018-05-18: qty 1

## 2018-05-18 MED ORDER — MECLIZINE HCL 25 MG PO TABS
25.0000 mg | ORAL_TABLET | Freq: Once | ORAL | Status: AC
  Administered 2018-05-18: 25 mg via ORAL
  Filled 2018-05-18: qty 1

## 2018-05-18 MED ORDER — GABAPENTIN 300 MG PO CAPS
300.0000 mg | ORAL_CAPSULE | Freq: Three times a day (TID) | ORAL | Status: DC
  Administered 2018-05-18 – 2018-05-19 (×3): 300 mg via ORAL
  Filled 2018-05-18 (×3): qty 1

## 2018-05-18 MED ORDER — IOPAMIDOL (ISOVUE-370) INJECTION 76%
80.0000 mL | Freq: Once | INTRAVENOUS | Status: AC | PRN
  Administered 2018-05-18: 80 mL via INTRAVENOUS

## 2018-05-18 NOTE — ED Notes (Signed)
Bed: [facility]4 Expected date:  Expected time:  Means of arrival:  Comments: 
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
## 2018-05-18 NOTE — ED Triage Notes (Signed)
pt reports woke up today and felt "there was nothing there". Anxiety and PTSD flaring up. Reports doesn't feel safe by himself and lives alone, when asked if having thoughts of harming himself.

## 2018-05-18 NOTE — ED Provider Notes (Signed)
[facility]-EMERGENCY DEPT Provider Note   CSN: [id number] Arrival date & time: 05/18/18  1414     History   Chief Complaint Chief Complaint  Patient presents with  . Anxiety    HPI Dylan Guerrero is a 42 y.o. male with history of carpal tunnel syndrome, headaches, morbid obesity presents for evaluation of gradual onset, progressively worsening feelings of anxiety and depression.  He states "I am just having these awful thoughts about death".  He states that he has been experiencing worsening thoughts of wanting to hurt himself such as driving off of a bridge.  He denies auditory or visual hallucinations but states that he will experience "premonitions ".  He states that he has a history of anxiety and PTSD.  He states he does not feel safe at home.  He denies homicidal ideations.  He states that he takes Xanax 0.5 mg tablets as needed "but lately have needed to take them every day ". He does not see a counselor with any regularity.  He denies any medical complaints at this time.  Of note, the patient was seen on 05/10/2018 for dizziness, 05/16/2018 for shortness of breath, and yesterday for chest pain with reassuring work-ups and was found to be stable for discharge home on those dates.  He is low risk for cardiac disease, PE, or stroke.  Ambulates with a cane at baseline.  The history is provided by the patient.    Past Medical History:  Diagnosis Date  . Abdominal pain   . Allergy   . Arthritis   . Carpal tunnel syndrome   . GERD (gastroesophageal reflux disease)   . Headache(784.0)   . Hernia   . Hypertension   . Hypokalemia   . Infection of the inner ear   . Migraine   . Sinus infection   . Sleep apnea   . Spider bite     Patient Active Problem List   Diagnosis Date Noted  . Morbid obesity due to excess calories (HCC) 04/11/2017  . Essential hypertension 04/11/2017  . DOE (dyspnea on exertion) 04/09/2017  . Migraine with aura 01/09/2013  .  Umbilical pain 10/22/2011    Past Surgical History:  Procedure Laterality Date  . APPENDECTOMY  1984  . HERNIA REPAIR  01/30/2011   Right Inguinal Hernia repair with mesh  . HERNIA REPAIR  01/30/2011   Umbilical Hernia repair with meash        Home Medications    Prior to Admission medications   Medication Sig Start Date End Date Taking? Authorizing Provider  albuterol (PROVENTIL HFA;VENTOLIN HFA) 108 (90 Base) MCG/ACT inhaler Inhale 2 puffs into the lungs every 6 (six) hours as needed for wheezing or shortness of breath.   Yes [provider]  ALPRAZolam Prudy Feeler) 0.5 MG tablet Take 0.5 mg by mouth 2 (two) times daily as needed for anxiety.  03/02/18  Yes [provider]  amLODipine (NORVASC) 10 MG tablet Take 10 mg by mouth daily.   Yes [provider]  bisoprolol (ZEBETA) 5 MG tablet TAKE 1 TABLET BY MOUTH EVERY DAY Patient taking differently: Take 5 mg by mouth daily.  05/16/18  Yes Nyoka Cowden, MD  colchicine 0.6 MG tablet Take 0.6 mg by mouth 2 (two) times daily as needed (for gout flares).    Yes [provider]  cyclobenzaprine (FLEXERIL) 5 MG tablet Take 5 mg by mouth 3 (three) times daily as needed for muscle spasms.  02/19/18  Yes [provider]  fluticasone (FLONASE) 50 MCG/ACT nasal spray Place 1 spray into both nostrils daily as needed for allergies or rhinitis.    Yes [provider]  Glycerin-Hypromellose-PEG 400 (ARTIFICIAL TEARS) 0.2-0.2-1 % SOLN Place 1-2 drops into both eyes 2 (two) times daily.   Yes [provider]  hydrochlorothiazide (HYDRODIURIL) 25 MG tablet Take 25 mg by mouth daily.   Yes [provider]  hydrocortisone cream 1 % Apply 1 application topically 2 (two) times daily as needed for itching (RASH).   Yes [provider]  linaclotide (LINZESS) 145 MCG CAPS capsule Take 145 mcg by mouth daily.   Yes [provider]  loratadine (CLARITIN) 10 MG tablet Take 10 mg  by mouth daily.   Yes [provider]  meclizine (ANTIVERT) 25 MG tablet Take 25 mg by mouth 2 (two) times daily.    Yes [provider]  meloxicam (MOBIC) 15 MG tablet Take 15 mg by mouth daily as needed for pain.    Yes [provider]  multivitamin (ONE-A-DAY MEN'S) TABS tablet Take 1 tablet by mouth daily.   Yes [provider]  omeprazole (PRILOSEC) 40 MG capsule TAKE 1 CAPSULE BY MOUTH EVERY DAY Patient taking differently: Take 40 mg by mouth daily.  05/16/18  Yes Nyoka CowdenWert, Michael B, MD  triamcinolone cream (KENALOG) 0.1 % Apply 1 application topically as needed (for rashes).  04/01/18  Yes [provider]  UNABLE TO FIND CPAP at bedtime   Yes [provider]    Family History Family History  Problem Relation Age of Onset  . Scleroderma Mother   . Diabetes Unknown   . Hypertension Unknown     Social History Social History   Tobacco Use  . Smoking status: Never Smoker  . Smokeless tobacco: Never Used  Substance Use Topics  . Alcohol use: No    Alcohol/week: 0.0 standard drinks    Comment: quit 10/05/2009  . Drug use: No     Allergies   Ibuprofen; Ketoprofen; Topiramate; and Adhesive [tape]   Review of Systems Review of Systems  Constitutional: Negative for chills and fever.  Neurological: Positive for weakness (right sided, chronic, unchanged). Negative for syncope.  Psychiatric/Behavioral: Positive for dysphoric mood and suicidal ideas. The patient is nervous/anxious.   All other systems reviewed and are negative.    Physical Exam Updated Vital Signs BP 133/87 (BP Location: Right Arm)   Pulse 86   Temp 97.9 F (36.6 C) (Oral)   Resp 16   SpO2 100%   Physical Exam  Constitutional: He appears well-developed and well-nourished. No distress.  HENT:  Head: Normocephalic and atraumatic.  Eyes: Conjunctivae are normal. Right eye exhibits no discharge. Left eye exhibits no discharge.  Neck: No JVD present. No  tracheal deviation present.  Cardiovascular: Normal rate, regular rhythm, normal heart sounds and intact distal pulses.  2+ radial and DP/PT pulses bilaterally, Homans sign absent bilaterally, no lower extremity edema, no palpable cords, compartments are soft   Pulmonary/Chest: Effort normal and breath sounds normal.  Abdominal: Soft. Bowel sounds are normal. He exhibits no distension. There is no tenderness. There is no guarding.  Musculoskeletal: Normal range of motion. He exhibits no edema.  Neurological: He is alert. He exhibits normal muscle tone.  Fluent speech with no evidence of dysarthria or aphasia, no facial droop.  Cranial nerves appear to be grossly intact.  Subjectively decreased sensation of the right upper and lower extremities but patient states this is chronic and  unchanged.  Good grip strength bilaterally.  No pronator drift.  Skin: Skin is warm and dry. No erythema.  Psychiatric: His speech is normal. He is withdrawn. He exhibits a depressed mood. He expresses suicidal ideation. He expresses no homicidal ideation. He expresses suicidal plans. He expresses no homicidal plans.  Exhibits thought blocking, does not appear to be responding to internal stimuli at this time.  Nursing note and vitals reviewed.    ED Treatments / Results  Labs (all labs ordered are listed, but only abnormal results are displayed) Labs Reviewed  COMPREHENSIVE METABOLIC PANEL - Abnormal; Notable for the following components:      Result Value   Creatinine, Ser 1.29 (*)    Total Protein 8.6 (*)    All other components within normal limits  ACETAMINOPHEN LEVEL - Abnormal; Notable for the following components:   Acetaminophen (Tylenol), Serum <10 (*)    All other components within normal limits  CBC - Abnormal; Notable for the following components:   RBC 5.90 (*)    All other components within normal limits  RAPID URINE DRUG SCREEN, HOSP PERFORMED - Abnormal; Notable for the following components:    Opiates   (*)    Value: Result not available. Reagent lot number recalled by manufacturer.   All other components within normal limits  ETHANOL  SALICYLATE LEVEL    EKG None  Radiology Dg Chest 2 View  Result Date: 05/17/2018 CLINICAL DATA:  42 y/o  M; mid chest pain for a couple days. EXAM: CHEST - 2 VIEW COMPARISON:  04/27/2018 chest radiograph FINDINGS: Stable heart size and mediastinal contours are within normal limits. Both lungs are clear. The visualized skeletal structures are unremarkable. IMPRESSION: No active cardiopulmonary disease. Electronically Signed   By: Mitzi HansenLance  Furusawa-Stratton M.D.   On: 05/17/2018 03:45   Ct Angio Chest Pe W And/or Wo Contrast  Result Date: 05/18/2018 CLINICAL DATA:  Continued chest pain since last night. Shortness of breath and dry cough. EXAM: CT ANGIOGRAPHY CHEST WITH CONTRAST TECHNIQUE: Multidetector CT imaging of the chest was performed using the standard protocol during bolus administration of intravenous contrast. Multiplanar CT image reconstructions and MIPs were obtained to evaluate the vascular anatomy. CONTRAST:  80 mL Isovue 370 COMPARISON:  CT abdomen and pelvis 12/21/2017 FINDINGS: Cardiovascular: There is good opacification of the central and segmental pulmonary arteries. No focal filling defects. No evidence of significant pulmonary embolus. Tiny amount of gas in the main pulmonary artery likely arises from intravenous injections. Normal caliber thoracic aorta. No aortic dissection. Great vessel origins are patent. Normal heart size. No pericardial effusions. Mediastinum/Nodes: Esophagus is decompressed. No significant lymphadenopathy in the chest. Lungs/Pleura: Motion artifact limits examination. Small air cyst in the left lung base. No focal consolidation. No pneumothorax. No pleural effusions. Airways are patent. Upper Abdomen: No acute changes are identified. Musculoskeletal: No chest wall abnormality. No acute or significant osseous  findings. Review of the MIP images confirms the above findings. IMPRESSION: No evidence of significant pulmonary embolus. No evidence of active pulmonary disease. Electronically Signed   By: Burman NievesWilliam  Stevens M.D.   On: 05/18/2018 00:56    Procedures Procedures (including critical care time)  Medications Ordered in ED Medications  hydrOXYzine (ATARAX/VISTARIL) tablet 25 mg (has no administration in time range)  gabapentin (NEURONTIN) capsule 300 mg (300 mg Oral Given 05/18/18 1655)     Initial Impression / Assessment and Plan / ED Course  I have reviewed the triage vital signs and the nursing notes.  Pertinent  labs & imaging results that were available during my care of the patient were reviewed by me and considered in my medical decision making (see chart for details).     Patient presents for evaluation of progressively worsening feelings of anxiety and depression.  He endorses suicidal ideations.  He is afebrile, vital signs are stable.  He is nontoxic in appearance.  He has been seen and evaluated in the ED yesterday and the day before with reassuring work-ups for various complaints.  Physical examination is reassuring as his review of lab work.  He is medically clear for TTS evaluation at this time.  4:30 PM Patient meets inpatient criteria for admission at this time.  Final Clinical Impressions(s) / ED Diagnoses   Final diagnoses:  Anxiety  Major depressive disorder, single episode, severe [facility])    ED Discharge Orders    None       Bennye Alm 05/18/18 1719    Lorre Nick, MD 05/19/18 2244

## 2018-05-18 NOTE — ED Notes (Signed)
Patient stated one of the reasons for his increased depression was due to his vertigo getting worse. He stated that he was only taking 1 meclizine per week and now he is taking it twice a day. Called Dr. Estell HarpinZammit for order for meclizine. Order placed for one time dose of 25 mg meclizine. Continue to monitor.

## 2018-05-18 NOTE — ED Notes (Signed)
[facility]: [id number] Expected date:  Expected time:  Means of arrival:  Comments: 2226

## 2018-05-18 NOTE — ED Notes (Signed)
Walked pt down hallway and back. States some dizziness but does not feel like he's going to fall.

## 2018-05-18 NOTE — Discharge Instructions (Addendum)
Dylan Guerrero:  Thank you for allowing us to take care of you today.  We hope you begin feeling better soon.  To-Do: Please follow-up with your primary doctor Please return to the Emergency Department or call 911 if you experience chest pain, shortness of breath, severe pain, severe fever, altered mental status, or have any reason to think that you need emergency medical care.  Thank you again.  Hope you feel better soon.

## 2018-05-18 NOTE — ED Notes (Signed)
Pt brother took home car keys at pt request

## 2018-05-19 ENCOUNTER — Encounter (HOSPITAL_COMMUNITY): Payer: Self-pay | Admitting: Emergency Medicine

## 2018-05-19 ENCOUNTER — Other Ambulatory Visit: Payer: Self-pay

## 2018-05-19 ENCOUNTER — Encounter (HOSPITAL_COMMUNITY): Payer: Self-pay | Admitting: Behavioral Health

## 2018-05-19 ENCOUNTER — Inpatient Hospital Stay (HOSPITAL_COMMUNITY)
Admission: AD | Admit: 2018-05-19 | Discharge: 2018-05-20 | DRG: 885 | Disposition: A | Discharge status: Home / Self Care | Payer: BC Managed Care – PPO | Source: Intra-hospital | Attending: Psychiatry | Admitting: Psychiatry

## 2018-05-19 DIAGNOSIS — Z23 Encounter for immunization: Secondary | ICD-10-CM | POA: Diagnosis not present

## 2018-05-19 DIAGNOSIS — F411 Generalized anxiety disorder: Secondary | ICD-10-CM | POA: Diagnosis present

## 2018-05-19 DIAGNOSIS — R45851 Suicidal ideations: Secondary | ICD-10-CM | POA: Diagnosis present

## 2018-05-19 DIAGNOSIS — F4310 Post-traumatic stress disorder, unspecified: Secondary | ICD-10-CM

## 2018-05-19 DIAGNOSIS — F410 Panic disorder [episodic paroxysmal anxiety] without agoraphobia: Secondary | ICD-10-CM

## 2018-05-19 DIAGNOSIS — F322 Major depressive disorder, single episode, severe without psychotic features: Principal | ICD-10-CM | POA: Diagnosis present

## 2018-05-19 DIAGNOSIS — I10 Essential (primary) hypertension: Secondary | ICD-10-CM | POA: Diagnosis present

## 2018-05-19 DIAGNOSIS — R0602 Shortness of breath: Secondary | ICD-10-CM | POA: Diagnosis not present

## 2018-05-19 MED ORDER — TRAZODONE HCL 50 MG PO TABS
50.0000 mg | ORAL_TABLET | Freq: Every evening | ORAL | Status: DC | PRN
  Administered 2018-05-19: 50 mg via ORAL
  Filled 2018-05-19: qty 1

## 2018-05-19 MED ORDER — ALUM & MAG HYDROXIDE-SIMETH 200-200-20 MG/5ML PO SUSP: 30.0000 mL | ORAL | Status: DC | PRN

## 2018-05-19 MED ORDER — MAGNESIUM HYDROXIDE 400 MG/5ML PO SUSP: 30.0000 mL | Freq: Every day | ORAL | Status: DC | PRN

## 2018-05-19 MED ORDER — CITALOPRAM HYDROBROMIDE 10 MG PO TABS
10.0000 mg | ORAL_TABLET | Freq: Every day | ORAL | Status: DC
  Administered 2018-05-19 – 2018-05-20 (×2): 10 mg via ORAL
  Filled 2018-05-19 (×5): qty 1

## 2018-05-19 MED ORDER — BISOPROLOL FUMARATE 5 MG PO TABS
5.0000 mg | ORAL_TABLET | Freq: Every day | ORAL | Status: DC
  Administered 2018-05-19: 5 mg via ORAL
  Filled 2018-05-19 (×5): qty 1

## 2018-05-19 MED ORDER — ALBUTEROL SULFATE HFA 108 (90 BASE) MCG/ACT IN AERS
2.0000 | INHALATION_SPRAY | Freq: Four times a day (QID) | RESPIRATORY_TRACT | Status: DC | PRN
Start: 2018-05-19 — End: 2018-05-20
  Administered 2018-05-19 – 2018-05-20 (×2): 2 via RESPIRATORY_TRACT
  Filled 2018-05-19: qty 6.7

## 2018-05-19 MED ORDER — HYDROXYZINE HCL 25 MG PO TABS: 25.0000 mg | ORAL_TABLET | Freq: Four times a day (QID) | ORAL | Status: DC | PRN

## 2018-05-19 MED ORDER — AMLODIPINE BESYLATE 10 MG PO TABS
10.0000 mg | ORAL_TABLET | Freq: Every day | ORAL | Status: DC
  Administered 2018-05-19 – 2018-05-20 (×2): 10 mg via ORAL
  Filled 2018-05-19 (×4): qty 1

## 2018-05-19 MED ORDER — HYDROXYZINE HCL 25 MG PO TABS
25.0000 mg | ORAL_TABLET | Freq: Three times a day (TID) | ORAL | Status: DC | PRN
  Administered 2018-05-19: 25 mg via ORAL
  Filled 2018-05-19: qty 1

## 2018-05-19 MED ORDER — GABAPENTIN 300 MG PO CAPS
300.0000 mg | ORAL_CAPSULE | Freq: Three times a day (TID) | ORAL | Status: DC
  Administered 2018-05-19 – 2018-05-20 (×3): 300 mg via ORAL
  Filled 2018-05-19 (×8): qty 1

## 2018-05-19 MED ORDER — ACETAMINOPHEN 325 MG PO TABS: 650.0000 mg | ORAL_TABLET | Freq: Four times a day (QID) | ORAL | Status: DC | PRN

## 2018-05-19 MED ORDER — PNEUMOCOCCAL VAC POLYVALENT 25 MCG/0.5ML IJ INJ
0.5000 mL | INJECTION | INTRAMUSCULAR | Status: AC
  Administered 2018-05-20: 0.5 mL via INTRAMUSCULAR

## 2018-05-19 NOTE — ED Notes (Signed)
Up tot he bathroom to shower and change scrubs 

## 2018-05-19 NOTE — ED Notes (Signed)
[facility], Pt [facility] transport
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
## 2018-05-19 NOTE — ED Notes (Signed)
Up to the bathroom, ambulatory w/o difficulty 

## 2018-05-19 NOTE — ED Notes (Signed)
Pt ambulatory w/o difficulty to [facility] with [place], belongings given to driver. 
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
## 2018-05-19 NOTE — BH Assessment (Deleted)
[facility]  Per Juanetta BeetsJacqueline Norman, DO, this pt is to [facility] further observation and stabilization.  Pt presents under IVC initiated by pt's daughter, which Dr Sharma CovertNorman has upheld.  Pt's nurse, Wille CelesteJanie, has been notified.  Doylene Canninghomas Lindsey Hommel, [place] Behavioral Health Coordinator [phone]
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
## 2018-05-19 NOTE — ED Notes (Signed)
[facility] is here to tranasport
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
## 2018-05-19 NOTE — ED Notes (Signed)
Pt resting in bed with eyes closed and even, unlabored respirations.. No distress noted.  Will continue to monitor.  

## 2018-05-19 NOTE — ED Notes (Signed)
[facility] contacted for transport 
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
## 2018-05-19 NOTE — ED Notes (Signed)
Eating lunch, pt [facility]
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
## 2018-05-19 NOTE — BH Assessment (Signed)
[facility]  Per Juanetta BeetsJacqueline Norman, DO, this pt requires psychiatric hospitalization at this time.  Arloa KohSteve Kallam, RN, [facility]; [facility] will be ready to receive pt at 13:00.  Pt has signed Voluntary Admission and Consent for Treatment, as well as Consent to Release Information to his brother and to his PCP, and signed forms [facility].  Pt's nurse, Wille CelesteJanie, has been notified, and agrees to send original paperwork along with pt via [facility] [place], and to call report to [phone].  Doylene Canninghomas Zoriana Oats, [place] Behavioral Health Coordinator [phone]

## 2018-05-20 MED ORDER — HYDROXYZINE HCL 25 MG PO TABS: 25.0000 mg | ORAL_TABLET | Freq: Three times a day (TID) | ORAL | 0 refills | Status: DC | PRN

## 2018-05-20 MED ORDER — TRAZODONE HCL 50 MG PO TABS: 50.0000 mg | ORAL_TABLET | Freq: Every evening | ORAL | 0 refills | Status: DC | PRN

## 2018-05-20 MED ORDER — CITALOPRAM HYDROBROMIDE 10 MG PO TABS: 10.0000 mg | ORAL_TABLET | Freq: Every day | ORAL | 0 refills | Status: DC

## 2018-05-20 MED ORDER — GABAPENTIN 300 MG PO CAPS: 300.0000 mg | ORAL_CAPSULE | Freq: Three times a day (TID) | ORAL | 0 refills | Status: DC

## 2018-05-20 NOTE — Progress Notes (Signed)
Patient ID: Dylan Guerrero, male   DOB: [birth year], 42 y.o.   MRN: [id number] DAR Note: Pt observed in the dayroom not interacting. Endorsed moderate anxiety and depression; "I am always anxious." Pt denied pain, SI, HI or AVH.  Pt was med compliant.  Support, encouragement, and safe environment provided.  15-minute safety checks continue. Pt attend wrap-up group.
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
## 2018-05-20 NOTE — Progress Notes (Signed)
  [facility] :  Will you be returning to the same living situation after discharge:  Yes,  own home At discharge, do you have transportation home?: Yes,  brother Do you have the ability to pay for your medications: Yes,  BC[employer]  Release of information consent forms completed and in the chart;  Patient's signature needed at discharge.  Patient to Follow up at: Follow-up Information    [facility]. Go on 05/24/2018.   Specialty:  Behavioral Health Why:  Please attend your Partial Hospitalization Program intake appt on Tuesday, 05/24/18, at 10:00am Contact information: [address] [place] [id number] [phone]          Next level of care provider [facility] Link:yes  Safety Planning and Suicide Prevention discussed: Yes,  with brother  Have you used any form of tobacco in the last 30 days? (Cigarettes, Smokeless Tobacco, Cigars, and/or Pipes): No  Has patient been referred to the Quitline?: N/A patient is not a smoker  Patient has been referred for addiction treatment: N/A  [name], Husna Krone Jon, LCSW 05/20/2018, 3:35 PM
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
## 2018-05-20 NOTE — [facility] Suicide Risk Assessment (Signed)
[facility]   Principal Problem: <principal problem not specified> Discharge Diagnoses:  Patient Active Problem List   Diagnosis Date Noted  . MDD (major depressive disorder), single episode, severe , no psychosis (HCC) [F32.2] 05/19/2018  . Generalized anxiety disorder [F41.1]   . Panic disorder [F41.0]   . Posttraumatic stress disorder [F43.10]   . Morbid obesity due to excess calories (HCC) [E66.01] 04/11/2017  . Essential hypertension [I10] 04/11/2017  . DOE (dyspnea on exertion) [R06.09] 04/09/2017  . Migraine with aura [G43.109] 01/09/2013  . Umbilical pain [R10.33] 10/22/2011    Total Time spent with patient: 15 minutes  Musculoskeletal: Strength & Muscle Tone: within normal limits Gait & Station: normal Patient leans: N/A  Psychiatric Specialty Exam: Review of Systems  All other systems reviewed and are negative.   Blood pressure 128/81, pulse 91, temperature 98.6 F (37 C), temperature source Oral, resp. rate 16, height 6\' 3"  (1.905 m), weight 120.2 kg, SpO2 100 %.Body mass index is 33.12 kg/m.  General Appearance: Casual  Eye Contact::  Fair  Speech:  Normal Rate409  Volume:  Normal  Mood:  Anxious  Affect:  Congruent  Thought Process:  Coherent  Orientation:  Full (Time, Place, and Person)  Thought Content:  Logical  Suicidal Thoughts:  No  Homicidal Thoughts:  No  Memory:  Immediate;   Fair Recent;   Fair Remote;   Fair  Judgement:  Intact  Insight:  Fair  Psychomotor Activity:  Increased  Concentration:  Fair  Recall:  FiservFair  Fund of Knowledge:Fair  Language: Fair  Akathisia:  Negative  Handed:  Right  AIMS (if indicated):     Assets:  Communication Skills Desire for Improvement Housing Physical Health Resilience Social Support  Sleep:  Number of Hours: 6.75  Cognition: WNL  ADL's:  Intact   Mental Status Per Nursing Assessment::   On Admission:  Suicidal ideation indicated by patient  Demographic Factors:   Male, Living alone and Unemployed  Loss Factors: NA  Historical Factors: Impulsivity  Risk Reduction Factors:   Positive social support  Continued Clinical Symptoms:  Severe Anxiety and/or Agitation  Cognitive Features That Contribute To Risk:  None    Suicide Risk:  Minimal: No identifiable suicidal ideation.  Patients presenting with no risk factors but with morbid ruminations; may be classified as minimal risk based on the severity of the depressive symptoms    Plan Of Care/Follow-up recommendations:  Activity:  ad lib  Antonieta PertGreg Lawson Clary, MD 05/20/2018, 8:11 AM

## 2018-05-20 NOTE — Progress Notes (Signed)
D Pt is readied for dc as he completes his daily assessment. On this , he wrote he denied SI today and he rated his depression, hopelessness and anxeity " 1/0/4", respectively. His dc teaching is completed by this wirter, he is given cc of dc isntrucitons ( SSP, AVS, SRA and transition record). He was taken to search room where his belongings in is locker were returned to him and then he was escorted to the bledg entrance and dc'd.

## 2018-05-20 NOTE — Progress Notes (Signed)
Adult Psychoeducational Group Note  Date:  05/20/2018 Time:  2:55 AM  Group Topic/Focus:  Wrap-Up Group:   The focus of this group is to help patients review their daily goal of treatment and discuss progress on daily workbooks.  Participation Level:  Active  Participation Quality:  Appropriate  Affect:  Appropriate  Cognitive:  Appropriate  Insight: Appropriate  Engagement in Group:  Engaged  Modes of Intervention:  Discussion  Additional Comments:  Pt stated he had a pretty good day for a change.  Pt spoke with pastor and revealed some things that he has been holding onto for years.  Pt's visit with his brother went well.  Pt rated the day at a 10/10.  Dylan Guerrero 05/20/2018, 2:55 AM
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
## 2018-05-24 ENCOUNTER — Ambulatory Visit (INDEPENDENT_AMBULATORY_CARE_PROVIDER_SITE_OTHER): Payer: BC Managed Care – PPO | Admitting: Psychiatry

## 2018-05-24 DIAGNOSIS — F411 Generalized anxiety disorder: Secondary | ICD-10-CM | POA: Diagnosis not present

## 2018-05-24 DIAGNOSIS — F410 Panic disorder [episodic paroxysmal anxiety] without agoraphobia: Secondary | ICD-10-CM | POA: Diagnosis not present

## 2018-05-24 DIAGNOSIS — F4310 Post-traumatic stress disorder, unspecified: Secondary | ICD-10-CM
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
## 2018-05-25 ENCOUNTER — Encounter (HOSPITAL_COMMUNITY): Payer: Self-pay | Admitting: Family

## 2018-05-25 ENCOUNTER — Other Ambulatory Visit (HOSPITAL_COMMUNITY): Payer: BC Managed Care – PPO | Admitting: Family

## 2018-05-26 ENCOUNTER — Other Ambulatory Visit (HOSPITAL_COMMUNITY): Payer: BC Managed Care – PPO | Attending: Psychiatry | Admitting: Licensed Clinical Social Worker

## 2018-05-26 ENCOUNTER — Other Ambulatory Visit (HOSPITAL_COMMUNITY): Payer: BC Managed Care – PPO | Admitting: Occupational Therapy

## 2018-05-26 ENCOUNTER — Other Ambulatory Visit: Payer: Self-pay

## 2018-05-26 ENCOUNTER — Encounter (HOSPITAL_COMMUNITY): Payer: Self-pay | Admitting: Occupational Therapy

## 2018-05-26 DIAGNOSIS — R4589 Other symptoms and signs involving emotional state: Secondary | ICD-10-CM

## 2018-05-26 DIAGNOSIS — F329 Major depressive disorder, single episode, unspecified: Secondary | ICD-10-CM | POA: Insufficient documentation

## 2018-05-26 DIAGNOSIS — Z886 Allergy status to analgesic agent status: Secondary | ICD-10-CM | POA: Diagnosis not present

## 2018-05-26 DIAGNOSIS — F419 Anxiety disorder, unspecified: Secondary | ICD-10-CM | POA: Diagnosis present

## 2018-05-26 DIAGNOSIS — R45851 Suicidal ideations: Secondary | ICD-10-CM | POA: Diagnosis not present

## 2018-05-26 DIAGNOSIS — F411 Generalized anxiety disorder: Secondary | ICD-10-CM | POA: Insufficient documentation

## 2018-05-26 DIAGNOSIS — J45909 Unspecified asthma, uncomplicated: Secondary | ICD-10-CM | POA: Insufficient documentation

## 2018-05-26 DIAGNOSIS — F070 Personality change due to known physiological condition: Secondary | ICD-10-CM

## 2018-05-26 DIAGNOSIS — F4310 Post-traumatic stress disorder, unspecified: Secondary | ICD-10-CM

## 2018-05-26 DIAGNOSIS — Z79899 Other long term (current) drug therapy: Secondary | ICD-10-CM | POA: Insufficient documentation

## 2018-05-26 DIAGNOSIS — R079 Chest pain, unspecified: Secondary | ICD-10-CM | POA: Insufficient documentation

## 2018-05-26 DIAGNOSIS — R42 Dizziness and giddiness: Secondary | ICD-10-CM | POA: Insufficient documentation
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
## 2018-05-26 NOTE — Psych (Signed)
   [facility]  Dylan Guerrero [id number]  Session Time: 9:00 - 11:00  Participation Level: Active  Behavioral Response: CasualAlertAnxious  Type of Therapy: Group Therapy  Treatment Goals addressed: Coping  Interventions: CBT, DBT, Solution Focused, Supportive and Reframing  Summary: Clinician led check-in regarding current stressors and situation, and review of patient completed daily inventory. Clinician utilized active listening and empathetic response and validated patient emotions. Clinician facilitated processing group on pertinent issues.   Therapist Response: Dylan IbaCraig L Cosper is a 42 y.o. male who presents with anxiety and trauma symptoms. Patient arrived within time allowed and reports that he is feeling "anxious." Patient rates his mood at a 3 on a scale of 1-10 with 10 being great. Pt reports great difficulty in leaving the house and making it to group. Pt states feeling defeated when he was unable to make it yesterday. Pt reports he is trying to meditate to help calm anxiety. Patient engaged in discussion.      Session Time: 11:00 - 12:00   Participation Level: Active   Behavioral Response: CasualAlertDepressed   Type of Therapy: Group Therapy, Psychotherapy   Treatment Goals addressed: Coping   Interventions:  Supportive, Reframing   Summary: Group participated in grief and loss group     Therapist Response: Patient engaged in group.       Session Time: 12:00 - 12:45  Participation Level: Active  Behavioral Response: CasualAlertDepressed  Type of Therapy: Group Therapy, Activity Therapy  Treatment Goals addressed: Coping  Interventions: Psychologist, occupationalocial Skills Training, Supportive  Summary:  Reflection Group: Patients encouraged to practice skills and interpersonal techniques or work on mindfulness and relaxation techniques. The importance of self-care and making skills part of a routine to increase usage were stressed   Therapist  Response: Patient engaged and participated.        Session Time: 12:45- 1:30  Participation Level: Active  Behavioral Response: CasualAlertDepressed  Type of Therapy: Group Therapy, Psychoeducation; Psychotherapy  Treatment Goals addressed: Coping  Interventions: CBT; Solution focused; Supportive; Reframing  Summary: 12:45 - 1:50: Cln continued topic of cognitive distortions. Group discussed how to "challenge" the unhealthy thought patterns once recognized. Group reviewed "Socratic Questions" handout and went over how to challenge irrational thoughts using that handout.  1:50 -2:00 Clinician led check-out. Clinician assessed for immediate needs, medication compliance and efficacy, and safety concerns   Therapist Response: Patient engaged in activity. Pt states understanding of how to challenge unhealthy thoughts and successfully reframed examples in discussion.  At check-out, patient rates his mood at a 8 on a scale of 1-10 with 10 being great. Pt reports afternoon plans of watching his nephew. Patient demonstrates some progress as evidenced by participating in first group session. Patient denies SI/HI/self-harm thoughts at the end of group.    Suicidal/Homicidal: Nowithout intent/plan   Plan: Pt will continue in PHP while working to decrease anxiety and trauma symptoms. Pt will work to be able to manage symptoms and resume daily tasks.   Diagnosis: Generalized anxiety disorder [F41.1]    1. Generalized anxiety disorder   2. Posttraumatic stress disorder       Donia GuilesJenny Ailine Hefferan, LCSW 05/26/2018

## 2018-05-27 ENCOUNTER — Other Ambulatory Visit (HOSPITAL_COMMUNITY): Payer: BC Managed Care – PPO | Admitting: Licensed Clinical Social Worker

## 2018-05-27 ENCOUNTER — Encounter (HOSPITAL_COMMUNITY): Payer: Self-pay | Admitting: Occupational Therapy

## 2018-05-27 ENCOUNTER — Other Ambulatory Visit (HOSPITAL_COMMUNITY): Payer: BC Managed Care – PPO | Admitting: Occupational Therapy

## 2018-05-27 ENCOUNTER — Encounter (HOSPITAL_COMMUNITY): Payer: Self-pay | Admitting: Family

## 2018-05-27 VITALS — BP 116/76 | HR 82 | Ht 72.5 in | Wt 260.0 lb

## 2018-05-27 DIAGNOSIS — R4589 Other symptoms and signs involving emotional state: Secondary | ICD-10-CM

## 2018-05-27 DIAGNOSIS — F411 Generalized anxiety disorder: Secondary | ICD-10-CM

## 2018-05-27 DIAGNOSIS — F070 Personality change due to known physiological condition: Secondary | ICD-10-CM

## 2018-05-27 MED ORDER — HYDROXYZINE HCL 25 MG PO TABS: 25.0000 mg | ORAL_TABLET | Freq: Three times a day (TID) | ORAL | 0 refills | Status: DC | PRN

## 2018-05-27 MED ORDER — DULOXETINE HCL 30 MG PO CPEP: 30.0000 mg | ORAL_CAPSULE | Freq: Every day | ORAL | 2 refills | Status: DC

## 2018-05-27 MED ORDER — CITALOPRAM HYDROBROMIDE 10 MG PO TABS: 20.0000 mg | ORAL_TABLET | Freq: Every day | ORAL | 0 refills | Status: DC

## 2018-05-27 MED ORDER — CITALOPRAM HYDROBROMIDE 10 MG PO TABS: 10.0000 mg | ORAL_TABLET | Freq: Every day | ORAL | 0 refills | Status: DC

## 2018-05-27 MED ORDER — CITALOPRAM HYDROBROMIDE 20 MG PO TABS: 20.0000 mg | ORAL_TABLET | Freq: Every day | ORAL | 2 refills | Status: DC

## 2018-05-27 NOTE — Therapy (Signed)
[facility] PARTIAL HOSPITALIZATION PROGRAM [address] [place], [place], [address] Phone: [phone]   Fax:  [phone]  Occupational Therapy Treatment  Patient Details  Name: Dylan Guerrero MRN: [id number] Date of Birth: 07/29/1976 Referring Provider: Hillery Jacks, NP   Encounter Date: 05/27/2018  OT End of Session - 05/27/18 1250    Visit Number  2    Number of Visits  16    Date for OT Re-Evaluation  06/23/18    Authorization Type  [employer]    OT Start Time  1100    OT Stop Time  1200    OT Time Calculation (min)  60 min    Activity Tolerance  Patient tolerated treatment well    Behavior During Therapy  [facility] for tasks assessed/performed       Past Medical History:  Diagnosis Date  . Abdominal pain   . Allergy   . Arthritis   . Carpal tunnel syndrome   . GERD (gastroesophageal reflux disease)   . Headache(784.0)   . Hernia   . Hypertension   . Hypokalemia   . Infection of the inner ear   . Migraine   . Sinus infection   . Sleep apnea   . Spider bite     Past Surgical History:  Procedure Laterality Date  . APPENDECTOMY  1984  . HERNIA REPAIR  01/30/2011   Right Inguinal Hernia repair with mesh  . HERNIA REPAIR  01/30/2011   Umbilical Hernia repair with meash    There were no vitals filed for this visit.  Subjective Assessment - 05/27/18 1248    Pain Score  10-Worst pain ever   back and neck pain   Pain Type  Chronic pain          S: "I have a really hard time expressing my feelings"   O: Education given on communication skills to increase social participation when reintegrating into community dwelling. Continued session on communication this date, with focus on verbal communication. Education given on appropriate conversation starters, continuations, and endings. Education given on "I statements" and their importance in increasing the way feelings are expressed, while minimizing blame on the other party. "I statements"  worksheet given for pt to construct own examples of "I statements". Pt asked to reflect on previous experiences of communication, and how to improve with an "I statement". Communication activity to be continued next treatment date.   A: Pt presents to group with flat affect and occasional anxious laughter, engaged and participatory throughout session. Pt in understanding of communication education, stating he has most difficulty starting conversations with others. Pt verbalized strategies to start conversation appropriately, with significant encouragement. "I statements" worksheet given, with pt completing activity with maximum cues for successful construction of I statements. Pt with considerable difficulty completing activity, with noted anxiety when sharing with others on the spot. Continued communication based treatment intervention needed next date to continue to increase skills for social participation and community integration. Pt will need significant intervention to continue to increase social participation skills.     P: Pt provided with communication skills to implement when reintegrating into community dwelling. OT will continue follow up with communication skills for successful implementation in daily life.           OT Education - 05/27/18 1248    Education Details  education given on communication skills to apply when reintegrating into the community to improve social participation    Person(s) Educated  Patient  Methods  Explanation;Handout    Comprehension  Verbalized understanding       OT Short Term Goals - 05/27/18 1250      OT SHORT TERM GOAL #1   Title  Pt will be educated on strategies to improve psychosocial skills needed to participate fully in all daily, work, and leisure activities    Time  4    Period  Weeks    Status  On-going    Target Date  06/23/18      OT SHORT TERM GOAL #2   Title  Pt will apply psychosocial skills and coping mechanisms to daily  activities in order to function independently and reintegrate into community dwelling    Time  4    Period  Weeks    Status  On-going      OT SHORT TERM GOAL #3   Title  Pt will recall and apply sleep hygiene strategies learned to implement for increased independence in BADL routine    Time  4    Period  Weeks    Status  On-going      OT SHORT TERM GOAL #4   Title  Pt will recall and apply 1-2 new social opportunities within desired comfort level to increase successful community engagement/reintegration     Time  4    Period  Weeks    Status  On-going               Plan - 05/27/18 1250    Occupational performance deficits (Please refer to evaluation for details):  ADL's;IADL's;Rest and Sleep;Work;Leisure;Social Participation       Patient will benefit from skilled therapeutic intervention in order to improve the following deficits and impairments:  Decreased coping skills, Decreased psychosocial skills, Other (comment)(decreased ability to engage in BADL and reintegrate into community)  Visit Diagnosis: Organic personality disorder  Difficulty coping    Problem List Patient Active Problem List   Diagnosis Date Noted  . MDD (major depressive disorder), single episode, severe , no psychosis (HCC) 05/19/2018  . Generalized anxiety disorder   . Panic disorder   . Posttraumatic stress disorder   . Morbid obesity due to excess calories (HCC) 04/11/2017  . Essential hypertension 04/11/2017  . DOE (dyspnea on exertion) 04/09/2017  . Migraine with aura 01/09/2013  . Umbilical pain 10/22/2011   [name], MSOT, OTR/L  [name] 05/27/2018, 12:51 PM  [facility] PART[facility] PROGRAM [address] [place], [place], [address] Phone: [phone]   Fax:  [phone]  Name: Dylan Guerrero MRN: [id number] Date of Birth: [birth year]

## 2018-05-27 NOTE — Psych (Signed)
   [facility] TH[facility]  Dylan Guerrero [id number]  Session Time: 9:00 - 11:00  Participation Level: Active  Behavioral Response: CasualAlertAnxious  Type of Therapy: Group Therapy  Treatment Goals addressed: Coping  Interventions: CBT, DBT, Solution Focused, Supportive and Reframing  Summary: Clinician led check-in regarding current stressors and situation, and review of patient completed daily inventory. Clinician utilized active listening and empathetic response and validated patient emotions. Clinician facilitated processing group on pertinent issues.   Therapist Response: Dylan IbaCraig L Burklow is a 42 y.o. male who presents with anxiety and trauma symptoms. Patient arrived within time allowed and reports that he is feeling "anxious." Pt. Listened to reflective reading from the "Book of Awakening". Pt. Participated in 4-3-8 breathing exercise and was able to reflect on current situation in his life that has been very stressful for him. Pt. Discussed losing his independence as his most significant current stressor. Pt. Participated in discussion applying vulnerability as a strength and developing the ability to become interdependent and not viewing his dependence on others as a deficit. Pt. Participated in activity around selecting a bead to represent tools that he is taking from group this week. Pt. Used to beads to make a light catcher to represent the concepts "openness" and "acceptance" that he has learned from the group.  Session Time: 11:00 - 12:00  Participation Level:Active  Behavioral Response:CasualAlertDepressed  Type of Therapy: Group Therapy, Psychotherapy  Treatment Goals addressed: Coping  Interventions: Supportive, Reframing  Summary: Group participated in occupational therapy group.    Therapist Response: See occupational therapist note.      Session Time: 12:15-1:00  Participation Level: Active  Behavioral Response:  CasualAlertDepressed  Type of Therapy: Group Therapy, Activity Therapy  Treatment Goals addressed: Coping  Interventions: Positive Psychology Training, Supportive  Summary:  Pt. Watched Shawn Achor video on the elements of positive psychology. Pt. Participated in discussion about the elements of gratitude, random acts of kindness, exercise, meditation, and journaling to assist with the coping and management of mental health disorders.  Therapist Response: Patient engaged and participated.   Suicidal/Homicidal: Nowithout intent/plan   Plan: Pt will continue in PHP while working to decrease anxiety and trauma symptoms. Pt will work to be able to manage symptoms and resume daily tasks  Diagnosis: Difficulty coping [R45.89]    1. Difficulty coping   2. Generalized anxiety disorder       Shaune PollackJennifer B Brown, [facility] 05/27/2018
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
## 2018-05-30 ENCOUNTER — Other Ambulatory Visit (HOSPITAL_COMMUNITY): Payer: BC Managed Care – PPO | Admitting: Licensed Clinical Social Worker

## 2018-05-30 ENCOUNTER — Encounter (HOSPITAL_COMMUNITY): Payer: Self-pay | Admitting: Occupational Therapy

## 2018-05-30 ENCOUNTER — Other Ambulatory Visit (HOSPITAL_COMMUNITY): Payer: BC Managed Care – PPO | Admitting: Occupational Therapy

## 2018-05-30 ENCOUNTER — Encounter (HOSPITAL_COMMUNITY): Payer: Self-pay

## 2018-05-30 VITALS — BP 108/70 | HR 72 | Ht 72.5 in | Wt 258.0 lb

## 2018-05-30 DIAGNOSIS — F411 Generalized anxiety disorder: Secondary | ICD-10-CM | POA: Diagnosis not present

## 2018-05-30 DIAGNOSIS — F070 Personality change due to known physiological condition: Secondary | ICD-10-CM

## 2018-05-30 DIAGNOSIS — R4589 Other symptoms and signs involving emotional state: Secondary | ICD-10-CM

## 2018-05-30 NOTE — Therapy (Signed)
[facility] PARTIAL HOSPITALIZATION PROGRAM [address] [place], [place], [address] Phone: [phone]   Fax:  [phone]  Occupational Therapy Treatment  Patient Details  Name: Dylan Guerrero MRN: [id number] Date of Birth: 1976-05-14 Referring Provider: Hillery Jacks, NP   Encounter Date: 05/30/2018  OT End of Session - 05/30/18 1432    Visit Number  3    Number of Visits  16    Date for OT Re-Evaluation  06/23/18    Authorization Type  [employer]    OT Start Time  1300    OT Stop Time  1400    OT Time Calculation (min)  60 min    Activity Tolerance  Patient tolerated treatment well    Behavior During Therapy  [facility] for tasks assessed/performed       Past Medical History:  Diagnosis Date  . Abdominal pain   . Allergy   . Arthritis   . Carpal tunnel syndrome   . GERD (gastroesophageal reflux disease)   . Headache(784.0)   . Hernia   . Hypertension   . Hypokalemia   . Infection of the inner ear   . Migraine   . Sinus infection   . Sleep apnea   . Spider bite     Past Surgical History:  Procedure Laterality Date  . APPENDECTOMY  1984  . HERNIA REPAIR  01/30/2011   Right Inguinal Hernia repair with mesh  . HERNIA REPAIR  01/30/2011   Umbilical Hernia repair with meash    There were no vitals filed for this visit.  Subjective Assessment - 05/30/18 1431    Currently in Pain?  Other (Comment)   chronic pain      OT TREATMENT- COMMUNICATION SKILLS  S: "I feel active listening is paraphrasing a bit to what the other person is saying"  O: OT treatment focus on communication skills, with emphasis of session on active listening. Ice breaker activity given with focus of active listening using a partner to complete the task given at start of session. Pt received handout to guide understanding of definition of active listening and how to improve skills this date. Pt asked to share personal experiences and examples throughout session. Active  listening activity completed with emphasis on one peer speaking with another summarizing without bias or opinion. Exercise focused on improving active listening skills and how to listen with an open mind. Discussion facilitated at end of session to make pt aware of peers' listening skills, body language, and verbal communication.   A: Pt presented to group with blunted affect, engaged and participatory throughout session. Pt engaged in ice breaker, needing min-mod VC's to complete activity successfully. Pt received active listening handout, in understanding of ways to improve current practices. Active listening activity engaged in with mild signs of anxiety noted- pt has hesitancy to speak out in front of others. Pt completed activity with success, showing all positive signs of communication.  P: Pt provided with communication skills to implement when reintegrating into community dwelling. OT will continue follow up with communication skills for successful implementation in daily life                      OT Education - 05/30/18 1432    Education Details  continued education given on communication skills to apply when reintegrating into community to improve social participation    Person(s) Educated  Patient    Methods  Explanation;Handout    Comprehension  Verbalized understanding  OT Short Term Goals - 05/27/18 1250      OT SHORT TERM GOAL #1   Title  Pt will be educated on strategies to improve psychosocial skills needed to participate fully in all daily, work, and leisure activities    Time  4    Period  Weeks    Status  On-going    Target Date  06/23/18      OT SHORT TERM GOAL #2   Title  Pt will apply psychosocial skills and coping mechanisms to daily activities in order to function independently and reintegrate into community dwelling    Time  4    Period  Weeks    Status  On-going      OT SHORT TERM GOAL #3   Title  Pt will recall and apply sleep hygiene  strategies learned to implement for increased independence in BADL routine    Time  4    Period  Weeks    Status  On-going      OT SHORT TERM GOAL #4   Title  Pt will recall and apply 1-2 new social opportunities within desired comfort level to increase successful community engagement/reintegration     Time  4    Period  Weeks    Status  On-going               Plan - 05/30/18 1433    Occupational performance deficits (Please refer to evaluation for details):  ADL's;IADL's;Rest and Sleep;Work;Leisure;Social Participation       Patient will benefit from skilled therapeutic intervention in order to improve the following deficits and impairments:  Decreased coping skills, Decreased psychosocial skills, Other (comment)(decreased ability to engage in BADL and reintegrate into community)  Visit Diagnosis: Organic personality disorder  Difficulty coping    Problem List Patient Active Problem List   Diagnosis Date Noted  . MDD (major depressive disorder), single episode, severe , no psychosis (HCC) 05/19/2018  . Generalized anxiety disorder   . Panic disorder   . Posttraumatic stress disorder   . Morbid obesity due to excess calories (HCC) 04/11/2017  . Essential hypertension 04/11/2017  . DOE (dyspnea on exertion) 04/09/2017  . Migraine with aura 01/09/2013  . Umbilical pain 10/22/2011   Dalphine HandingKaylee Sianni Cloninger, MSOT, OTR/L  [place] Rauf 05/30/2018, 2:33 PM  [facility] [address] [place], [place], [address] Phone: [phone]   Fax:  [phone]  Name: Dylan Guerrero MRN: [id number] Date of Birth: 1976-06-06

## 2018-05-30 NOTE — Progress Notes (Signed)
Patient presents with appropriate affect, anxious mood and stated he had started Cymbalta on Saturday 05/28/18 and increased Celexa to 20 mg a day.  Stated he stopped Neurontin and did fine over the weekend except for the previous night when he noticed he was waking up a lot more and twitching and then jumping/startling awake in bed.  Patient reported he only slept a few hours the previous night and questioned if due to medication changes.  Patient with no other symptoms or concerns and rated his depression a 0, anxiety a 7 and hopelessness a 0 on a scale of 0-10 with 0 being none and 10 the worst he could manage.  Patient reported he had not taken Trazodone the previous night and will try this tonight as states it does help him with sleep when he takes it.  Patient also reported he was going to do PHP 3 days a week now on Mondays, Wednesdays and Fridays due to cost of using an [employer] each day.  Agreed to send Hillery Jacksanika Lewis, NP a message about his sleep the previous night and to make sure he is able to see her upon his return on Wednesday 06/01/18.  Patient concerned with his neurological issues that perhaps the sudden stop in Neurontin may be affecting his sleep.  States he notices no symptoms or concerns during the day and that this did not happen the first night of medication changes on Saturday night 05/28/18.  Patient reported no concerns at present and denies any suicidal or homicidal ideations, no auditory or visual hallucinations and no intent to harm self or others at this time.

## 2018-05-31 ENCOUNTER — Encounter: Payer: Self-pay | Admitting: Diagnostic Neuroimaging

## 2018-05-31 ENCOUNTER — Ambulatory Visit: Payer: BC Managed Care – PPO | Admitting: Diagnostic Neuroimaging

## 2018-05-31 ENCOUNTER — Ambulatory Visit (HOSPITAL_COMMUNITY): Payer: Self-pay

## 2018-05-31 ENCOUNTER — Encounter

## 2018-05-31 VITALS — BP 114/84 | HR 73 | Ht 72.5 in | Wt 258.0 lb

## 2018-05-31 DIAGNOSIS — F419 Anxiety disorder, unspecified: Secondary | ICD-10-CM | POA: Diagnosis not present

## 2018-05-31 DIAGNOSIS — G43109 Migraine with aura, not intractable, without status migrainosus: Secondary | ICD-10-CM

## 2018-05-31 DIAGNOSIS — G4733 Obstructive sleep apnea (adult) (pediatric): Secondary | ICD-10-CM

## 2018-05-31 DIAGNOSIS — Z9989 Dependence on other enabling machines and devices: Secondary | ICD-10-CM

## 2018-05-31 DIAGNOSIS — R42 Dizziness and giddiness: Secondary | ICD-10-CM | POA: Diagnosis not present

## 2018-05-31 NOTE — Patient Instructions (Signed)
-   monitor symptoms
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
## 2018-06-01 ENCOUNTER — Other Ambulatory Visit (HOSPITAL_COMMUNITY): Payer: BC Managed Care – PPO | Admitting: Licensed Clinical Social Worker

## 2018-06-01 DIAGNOSIS — F411 Generalized anxiety disorder: Secondary | ICD-10-CM

## 2018-06-01 DIAGNOSIS — R4589 Other symptoms and signs involving emotional state: Secondary | ICD-10-CM

## 2018-06-01 NOTE — Progress Notes (Signed)
BH MD/PA/NP PHP Progress Note  06/01/2018 9:18 AM CLARICE BONAVENTURE  MRN:  [id number]    [name] observed attending daily group session with active and engaged participation.  Continues to report anxiety with slight mood irritability.  Patient was initiated on gabapentin 300 mg p.o. 3 times daily, while inpatient at behavioral health however reports feeling more anxious with tremors. Patient was recently started on gabapentin 300 mg p.o. 3 times daily while inpatient x2 week ago. Reports he feels the tremors was related to the increase in gabapentin. As he reports he was previously prescribed gabapentin 100 mg however felt sedated with the 100 mg dose.  NP discussed discontinuing gabapentin however patient reports he was followed by his neurologist on yesterday who encouraged patient is to continue medication to see if tremors will subside.  Patient has concerns that Cymbalta is causing constipation " my body is very sensitive to medications".  Patient was encouraged to increase hydration and may use over-the-counter stool softener to aid with symptoms.  States he has been attempting to reintroduce his self to driving around his neighborhood.  Reports he went out yesterday for a few blocks.  And states feeling okay after the test drive.  Continues to deny suicidal or homicidal ideations. Denies auditory visual hallucinations.  Denies any depression or depressive symptoms.  Support encouragement reassurance was provided.   History:Per assessment notes for inpatient admission:Patient is a 42 year old male with a probable past psychiatric history significant for posttraumatic stress disorder, generalized anxiety disorder as well as panic disorder who presented to the [facility] emergency department on 8/14 with anxiety symptoms. He complained of shortness of breath and chest pain. He has had multiple emergency room visits over the last several years. He has had concern for possible pulmonary emboli, CVAs,  physical ailments as well as chest pain. It sounds as though he has had a significant degree of anxiety for multiple years. He lives alone in [place], and is currently on disability for vertigo, asthma and possibly PTSD. He has not seen a psychiatrist in treatment, but apparently saw some psychiatrist for disability evaluation. The notes in the emergency room chart suggest that the patient had suicidal ideation to drive his car off a bridge. He denies that currently. He complained of significant anxiety, shortness of breath, tremulousness. Mostly symptoms have been going on for years, and he went on disability in February of last year    Visit Diagnosis:    ICD-10-CM   1. Generalized anxiety disorder F41.1     Past Psychiatric History:  Past Medical History:  Past Medical History:  Diagnosis Date  . Abdominal pain   . Allergy   . Arthritis   . Carpal tunnel syndrome   . GERD (gastroesophageal reflux disease)   . Headache(784.0)   . Hernia   . Hypertension   . Hypokalemia   . Infection of the inner ear   . Migraine   . Sinus infection   . Sleep apnea   . Spider bite     Past Surgical History:  Procedure Laterality Date  . APPENDECTOMY  1984  . HERNIA REPAIR  01/30/2011   Right Inguinal Hernia repair with mesh  . HERNIA REPAIR  01/30/2011   Umbilical Hernia repair with meash    Family Psychiatric History:   Family History:  Family History  Problem Relation Age of Onset  . Scleroderma Mother   . Depression Father   . Diabetes Unknown   . Hypertension Unknown   . Dementia  Paternal Grandfather   . ADD / ADHD Other     Social History:  Social History   Socioeconomic History  . Marital status: Single    Spouse name: Not on file  . Number of children: 0  . Years of education: college  . Highest education level: Not on file  Occupational History  . Occupation: BUS DRIVER    Employer: [employer]  Social Needs  . Financial resource strain:  Somewhat hard  . Food insecurity:    Worry: Sometimes true    Inability: Sometimes true  . Transportation needs:    Medical: Yes    Non-medical: Yes  Tobacco Use  . Smoking status: Never Smoker  . Smokeless tobacco: Never Used  Substance and Sexual Activity  . Alcohol use: No    Alcohol/week: 0.0 standard drinks    Comment: quit 10/05/2009  . Drug use: No  . Sexual activity: Not Currently  Lifestyle  . Physical activity:    Days per week: 1 day    Minutes per session: 30 min  . Stress: Very much  Relationships  . Social connections:    Talks on phone: More than three times a week    Gets together: Once a week    Attends religious service: More than 4 times per year    Active member of club or organization: No    Attends meetings of clubs or organizations: Never    Relationship status: Never married  Other Topics Concern  . Not on file  Social History Narrative   Pt lives at home alone.   Caffeine Use- maybe 1 cup of coffee a week    Patient has some financial restraints and transportation issues that have at times affected his ability to get medications and to meet basic needs adequately.     Allergies:  Allergies  Allergen Reactions  . Ibuprofen Other (See Comments)    Acid reflux  . Ketoprofen Other (See Comments)    Muscle and stomach cramps   . Topiramate Other (See Comments)    Abdominal cramps and constipation   . Adhesive [Tape] Rash    Medical tape    Metabolic Disorder Labs: No results found for: HGBA1C, MPG No results found for: PROLACTIN No results found for: CHOL, TRIG, HDL, CHOLHDL, VLDL, LDLCALC No results found for: TSH  Therapeutic Level Labs: No results found for: LITHIUM No results found for: VALPROATE No components found for:  CBMZ  Current Medications: Current Outpatient Medications  Medication Sig Dispense Refill  . albuterol (PROVENTIL HFA;VENTOLIN HFA) 108 (90 Base) MCG/ACT inhaler Inhale 2 puffs into the lungs every 6 (six)  hours as needed for wheezing or shortness of breath.    Marland Kitchen. amLODipine (NORVASC) 10 MG tablet Take 10 mg by mouth daily.    . bisoprolol (ZEBETA) 5 MG tablet TAKE 1 TABLET BY MOUTH EVERY DAY (Patient taking differently: Take 5 mg by mouth daily. ) 30 tablet 0  . citalopram (CELEXA) 20 MG tablet Take 1 tablet (20 mg total) by mouth daily. 30 tablet 2  . DULoxetine (CYMBALTA) 30 MG capsule Take 1 capsule (30 mg total) by mouth daily. 30 capsule 2  . fluticasone (FLONASE) 50 MCG/ACT nasal spray Place 1 spray into both nostrils daily as needed for allergies or rhinitis.     . Glycerin-Hypromellose-PEG 400 (ARTIFICIAL TEARS) 0.2-0.2-1 % SOLN Place 1-2 drops into both eyes 2 (two) times daily.    . hydrochlorothiazide (HYDRODIURIL) 25 MG tablet Take 25 mg by  mouth daily.    . hydrocortisone cream 1 % Apply 1 application topically 2 (two) times daily as needed for itching (RASH).    . hydrOXYzine (ATARAX/VISTARIL) 25 MG tablet Take 1 tablet (25 mg total) by mouth 3 (three) times daily as needed for anxiety. 90 tablet 0  . linaclotide (LINZESS) 145 MCG CAPS capsule Take 145 mcg by mouth daily.    Marland Kitchen loratadine (CLARITIN) 10 MG tablet Take 10 mg by mouth daily.    . meclizine (ANTIVERT) 25 MG tablet Take 25 mg by mouth 2 (two) times daily.     Marland Kitchen omeprazole (PRILOSEC) 40 MG capsule TAKE 1 CAPSULE BY MOUTH EVERY DAY (Patient taking differently: Take 40 mg by mouth daily. ) 30 capsule 0  . traZODone (DESYREL) 50 MG tablet Take 1 tablet (50 mg total) by mouth at bedtime as needed for sleep. 30 tablet 0  . triamcinolone cream (KENALOG) 0.1 % Apply 1 application topically as needed (for rashes).   1  . UNABLE TO FIND CPAP at bedtime     No current facility-administered medications for this visit.      Musculoskeletal: Strength & Muscle Tone: within normal limits Gait & Station: unsteady observed using a cane for ambulation assistance due to unsteady gait Patient leans: N/A  Psychiatric Specialty Exam: ROS   There were no vitals taken for this visit.There is no height or weight on file to calculate BMI.  General Appearance: Casual  Eye Contact:  Fair  Speech:  Clear and Coherent  Volume:  Normal  Mood:  Anxious  Affect:  Congruent  Thought Process:  Coherent  Orientation:  Full (Time, Place, and Person)  Thought Content: Hallucinations: None   Suicidal Thoughts:  No  Homicidal Thoughts:  No  Memory:  Immediate;   Fair Recent;   Fair Remote;   Fair  Judgement:  Fair  Insight:  Fair  Psychomotor Activity:  Normal  Concentration:  Concentration: Fair  Recall:  [employer] of Knowledge: Fair  Language: Fair  Akathisia:  No  Handed:  Right  AIMS (if indicated):   Assets:  Communication Skills Desire for Improvement Resilience Social Support  ADL's:  Intact  Cognition: WNL  Sleep:  Fair   Screenings: AIMS     [facility]  AIMS Total Score  0    AUDIT     Ad[facility]  Alcohol Use Disorder Identification Test Final Score (AUDIT)  0    GAD-7     Counselor from [facility]  Total GAD-7 Score  18    PHQ2-9     Counselor from 05/26/2018 in [facility]  PHQ-2 Total Score  4  PHQ-9 Total Score  18       Assessment and Plan:  Continue partial hospitalization program Continue medications as prescribed by neurologist Patient was agreeable to continue taking Cymbalta 30 mg p.o. daily we will follow-up with symptoms.  Treatment plan was reviewed and agreed upon by NP Chilton Greathouse and patient Wilder Glade need for continued group services   [name], NP 06/01/2018, 9:18 AM

## 2018-06-02 ENCOUNTER — Ambulatory Visit (HOSPITAL_COMMUNITY): Payer: Self-pay

## 2018-06-02 ENCOUNTER — Other Ambulatory Visit (HOSPITAL_COMMUNITY): Payer: Self-pay

## 2018-06-02 ENCOUNTER — Encounter (HOSPITAL_COMMUNITY): Payer: Self-pay

## 2018-06-02 NOTE — Progress Notes (Signed)
Spiritual care group 06/01/2018 11:00-12:00 Facilitated by Wilkie Ayehaplain Laveyah Oriol, MDiv, [facility]   Group focused on topic of "self-care" Patients engaged in facilitated discussion about topic, exploring thoughts, feelings and previous conceptions of self care that arise.  Explored quotes related to self care and chose one which they agreed with and one which they disliked. Engaged in discussion around quote choices and their experience / understanding of care for themselves.  Dylan Guerrero was present throughout group.  A&O x 4.   Quiet at beginning of group, he became progressively engaged in group discussion with facilitator prompting.  Dylan Guerrero shared his practice of focusing on positivity at the beginning of the day and as the day progresses, reminding himself of what he can and can't control.    [facility] / [facility] Chaplain Burnis KingfisherMatthew Sabrina Arriaga, MDiv, [facility]

## 2018-06-03 ENCOUNTER — Encounter (HOSPITAL_COMMUNITY): Payer: Self-pay | Admitting: Family

## 2018-06-03 ENCOUNTER — Other Ambulatory Visit (HOSPITAL_COMMUNITY): Payer: BC Managed Care – PPO | Admitting: Licensed Clinical Social Worker

## 2018-06-03 ENCOUNTER — Encounter (HOSPITAL_COMMUNITY): Payer: Self-pay | Admitting: Occupational Therapy

## 2018-06-03 ENCOUNTER — Other Ambulatory Visit (HOSPITAL_COMMUNITY): Payer: BC Managed Care – PPO | Admitting: Occupational Therapy

## 2018-06-03 DIAGNOSIS — F411 Generalized anxiety disorder: Secondary | ICD-10-CM | POA: Diagnosis not present

## 2018-06-03 DIAGNOSIS — F070 Personality change due to known physiological condition: Secondary | ICD-10-CM

## 2018-06-03 DIAGNOSIS — R4589 Other symptoms and signs involving emotional state: Secondary | ICD-10-CM

## 2018-06-03 NOTE — Therapy (Signed)
[facility] PARTIAL HOSPITALIZATION PROGRAM [address] [place], [place], [address] Phone: [phone]   Fax:  [phone]  Occupational Therapy Treatment  Patient Details  Name: Dylan Guerrero MRN: [id number] Date of Birth: Apr 19, 1976 Referring Provider: Hillery Jacks, NP   Encounter Date: 06/03/2018  OT End of Session - 06/03/18 1251    Visit Number  4    Number of Visits  16    Date for OT Re-Evaluation  06/23/18    Authorization Type  BCBS    OT Start Time  1100    OT Stop Time  1200    OT Time Calculation (min)  60 min    Activity Tolerance  Patient tolerated treatment well    Behavior During Therapy  [facility] for tasks assessed/performed       Past Medical History:  Diagnosis Date  . Abdominal pain   . Allergy   . Arthritis   . Carpal tunnel syndrome   . GERD (gastroesophageal reflux disease)   . Headache(784.0)   . Hernia   . Hypertension   . Hypokalemia   . Infection of the inner ear   . Migraine   . Sinus infection   . Sleep apnea   . Spider bite     Past Surgical History:  Procedure Laterality Date  . APPENDECTOMY  1984  . HERNIA REPAIR  01/30/2011   Right Inguinal Hernia repair with mesh  . HERNIA REPAIR  01/30/2011   Umbilical Hernia repair with meash    There were no vitals filed for this visit.  Subjective Assessment - 06/03/18 1250    Currently in Pain?  Other (Comment)   chronic pain       S: "I spend a lot of time watching TV and playing video games"  O: Education received on time management to help increase occupational balance and quality of life. Time management activity given, with teams of 2 listing names of states on blank map in set amount of time. Reflection with group given on strategy, purpose, and feelings on activity. Activities wheel activity administered with pt to identify hours devoted to: work/obligations, leisure/relaxation, self-care/caregiving, and sleep/rest. Further education given to allow pt  to better balance activity wheel for increased occupational balance. Weekly planner handouts given at end of session with education on different modalities of organization (planner,?apps, computer, to do lists, etc.) so pt could choose preferred avenue. Further education given on the importance of time management in community reintegration from [facility] and how to continue using skills.   A:?Pt presents to group with flat affect,engaged and participatory throughout session. Pt completed time management activity, stating his strategy was to be the writer in the activity and felt pressure due to the time constraint.  Pt completed activities wheel, stating he spends most of his time watching TV and playing video games, with occasionally babysitting for family members. Pt with no current desire to change this routine this date, encouraged engagement in volunteer activity for sense of purpose and belonging. Pt still contemplating this suggestion, will follow up.   P: Pt provided with education on time management skills to increase occupational balance and quality of life. OT will continue to follow up with pt for successful implementation into daily life.?                      OT Education - 06/03/18 1250    Education Details  education given on time management for routine management and  increased community engagement    Starwood HotelsPerson(s) Educated  Patient    Methods  Explanation;Handout    Comprehension  Verbalized understanding       OT Short Term Goals - 05/27/18 1250      OT SHORT TERM GOAL #1   Title  Pt will be educated on strategies to improve psychosocial skills needed to participate fully in all daily, work, and leisure activities    Time  4    Period  Weeks    Status  On-going    Target Date  06/23/18      OT SHORT TERM GOAL #2   Title  Pt will apply psychosocial skills and coping mechanisms to daily activities in order to function independently and reintegrate into community  dwelling    Time  4    Period  Weeks    Status  On-going      OT SHORT TERM GOAL #3   Title  Pt will recall and apply sleep hygiene strategies learned to implement for increased independence in BADL routine    Time  4    Period  Weeks    Status  On-going      OT SHORT TERM GOAL #4   Title  Pt will recall and apply 1-2 new social opportunities within desired comfort level to increase successful community engagement/reintegration     Time  4    Period  Weeks    Status  On-going               Plan - 06/03/18 1251    Occupational performance deficits (Please refer to evaluation for details):  ADL's;IADL's;Rest and Sleep;Work;Leisure;Social Participation       Patient will benefit from skilled therapeutic intervention in order to improve the following deficits and impairments:  Decreased coping skills, Decreased psychosocial skills, Other (comment)(decreased ability to engage in BADL and reintegrate into community)  Visit Diagnosis: Organic personality disorder  Difficulty coping    Problem List Patient Active Problem List   Diagnosis Date Noted  . MDD (major depressive disorder), single episode, severe , no psychosis (HCC) 05/19/2018  . Generalized anxiety disorder   . Panic disorder   . Posttraumatic stress disorder   . Morbid obesity due to excess calories (HCC) 04/11/2017  . Essential hypertension 04/11/2017  . DOE (dyspnea on exertion) 04/09/2017  . Migraine with aura 01/09/2013  . Umbilical pain 10/22/2011   Dalphine HandingKaylee Ronrico Dupin, MSOT, OTR/L  [place]lee Hind Ches[place] 06/03/2018, 12:53 PM  [facility] [address] [place], [place], [address] Phone: [phone]   Fax:  [phone]  Name: Antonieta IbaCraig L Ledbetter MRN: [id number] Date of Birth: 1976-06-25

## 2018-06-06 ENCOUNTER — Other Ambulatory Visit (HOSPITAL_COMMUNITY): Payer: Self-pay

## 2018-06-06 ENCOUNTER — Ambulatory Visit (HOSPITAL_COMMUNITY): Payer: Self-pay

## 2018-06-07 ENCOUNTER — Other Ambulatory Visit (HOSPITAL_COMMUNITY): Payer: BC Managed Care – PPO | Admitting: Occupational Therapy

## 2018-06-07 ENCOUNTER — Other Ambulatory Visit (HOSPITAL_COMMUNITY): Payer: BC Managed Care – PPO | Attending: Psychiatry | Admitting: Licensed Clinical Social Worker

## 2018-06-07 ENCOUNTER — Encounter (HOSPITAL_COMMUNITY): Payer: Self-pay | Admitting: Occupational Therapy

## 2018-06-07 DIAGNOSIS — F411 Generalized anxiety disorder: Secondary | ICD-10-CM

## 2018-06-07 DIAGNOSIS — F419 Anxiety disorder, unspecified: Secondary | ICD-10-CM | POA: Insufficient documentation

## 2018-06-07 DIAGNOSIS — F329 Major depressive disorder, single episode, unspecified: Secondary | ICD-10-CM | POA: Diagnosis not present

## 2018-06-07 DIAGNOSIS — F070 Personality change due to known physiological condition: Secondary | ICD-10-CM

## 2018-06-07 DIAGNOSIS — R4589 Other symptoms and signs involving emotional state: Secondary | ICD-10-CM

## 2018-06-07 NOTE — Therapy (Signed)
[facility] PARTIAL HOSPITALIZATION PROGRAM [address] [place], [place], [address] Phone: [phone]   Fax:  [phone]  Occupational Therapy Treatment  Patient Details  Name: Dylan Guerrero MRN: [id number] Date of Birth: 1976/03/01 Referring Provider: Hillery Jacks, NP   Encounter Date: 06/07/2018  OT End of Session - 06/07/18 1355    Visit Number  5    Number of Visits  16    Date for OT Re-Evaluation  06/23/18    Authorization Type  BCBS    OT Start Time  1100    OT Stop Time  1200    OT Time Calculation (min)  60 min    Activity Tolerance  Patient tolerated treatment well    Behavior During Therapy  [facility] for tasks assessed/performed       Past Medical History:  Diagnosis Date  . Abdominal pain   . Allergy   . Arthritis   . Carpal tunnel syndrome   . GERD (gastroesophageal reflux disease)   . Headache(784.0)   . Hernia   . Hypertension   . Hypokalemia   . Infection of the inner ear   . Migraine   . Sinus infection   . Sleep apnea   . Spider bite     Past Surgical History:  Procedure Laterality Date  . APPENDECTOMY  1984  . HERNIA REPAIR  01/30/2011   Right Inguinal Hernia repair with mesh  . HERNIA REPAIR  01/30/2011   Umbilical Hernia repair with meash    There were no vitals filed for this visit.  Subjective Assessment - 06/07/18 1354    Currently in Pain?  Other (Comment)   chronic pain        S: "My self esteem has increased since spending time with my family, they bring me up"  O: Education given on definition and importance of positive self-esteem in daily life and relationships with focus on using positive self-talk. Further education given on the relationship between self-esteem and mental illness and both high and low self-esteem factors.?Pt asked to brainstorm probable causes of low self esteem and how to improve this date. Artistic expressive activity completed with negative self talk and positive self talk  using a paper bag. Pt to share negative thoughts about oneself (outside of bag) vs positive thoughts with art/quotes/ pictures on the inside of the bag. Pt to share positives and how they are working to implement them into daily life. Peers asked to identify change in affect noted with pt for emotional recognition.   A: Pt presents to group with appropriate affect, engaged and participatory throughout session. Pt in understanding of self esteem education this date, sharing he is best influenced by positive social support-which is a change from his previous behavior of being isolative. Pt completed paper bag activity sharing much of his anxieties on the outside of the bag, and goals to overcome on the inside of the bag.    P: Pt provided with self-esteem boosting skills to implement into a variety of daily activities/routines. OT will continue to follow up for successful implementation into daily life.                  OT Education - 06/07/18 1354    Education Details  education given on self esteem and how to improve this date    Person(s) Educated  Patient    Methods  Explanation;Handout    Comprehension  Verbalized understanding       OT  Short Term Goals - 05/27/18 1250      OT SHORT TERM GOAL #1   Title  Pt will be educated on strategies to improve psychosocial skills needed to participate fully in all daily, work, and leisure activities    Time  4    Period  Weeks    Status  On-going    Target Date  06/23/18      OT SHORT TERM GOAL #2   Title  Pt will apply psychosocial skills and coping mechanisms to daily activities in order to function independently and reintegrate into community dwelling    Time  4    Period  Weeks    Status  On-going      OT SHORT TERM GOAL #3   Title  Pt will recall and apply sleep hygiene strategies learned to implement for increased independence in BADL routine    Time  4    Period  Weeks    Status  On-going      OT SHORT TERM GOAL #4    Title  Pt will recall and apply 1-2 new social opportunities within desired comfort level to increase successful community engagement/reintegration     Time  4    Period  Weeks    Status  On-going               Plan - 06/07/18 1355    Occupational performance deficits (Please refer to evaluation for details):  ADL's;IADL's;Rest and Sleep;Work;Leisure;Social Participation       Patient will benefit from skilled therapeutic intervention in order to improve the following deficits and impairments:  Decreased coping skills, Decreased psychosocial skills, Other (comment)(decreased ability to engage in BADL and reintegrate into community)  Visit Diagnosis: Organic personality disorder  Difficulty coping    Problem List Patient Active Problem List   Diagnosis Date Noted  . MDD (major depressive disorder), single episode, severe , no psychosis (HCC) 05/19/2018  . Generalized anxiety disorder   . Panic disorder   . Posttraumatic stress disorder   . Morbid obesity due to excess calories (HCC) 04/11/2017  . Essential hypertension 04/11/2017  . DOE (dyspnea on exertion) 04/09/2017  . Migraine with aura 01/09/2013  . Umbilical pain 10/22/2011   Dalphine Handing, MSOT, OTR/L  [place] 06/07/2018, 1:56 PM  [facility] PARTIAL HOSPITALIZATION PROGRAM [address] [place], [place], [address] Phone: [phone]   Fax:  [phone]  Name: Dylan Guerrero MRN: [id number] Date of Birth: 06-11-1976

## 2018-06-08 ENCOUNTER — Other Ambulatory Visit (HOSPITAL_COMMUNITY): Payer: BC Managed Care – PPO | Admitting: Licensed Clinical Social Worker

## 2018-06-08 DIAGNOSIS — F329 Major depressive disorder, single episode, unspecified: Secondary | ICD-10-CM | POA: Diagnosis not present

## 2018-06-08 DIAGNOSIS — F411 Generalized anxiety disorder: Secondary | ICD-10-CM

## 2018-06-08 NOTE — Psych (Signed)
   [facility] BH PHP THERAPIST PROGRESS NOTE  Dylan Guerrero [id number]  Session Time: 9:00 - 10:00  Participation Level: Active  Behavioral Response: CasualAlertDepressed  Type of Therapy: Group Therapy  Treatment Goals addressed: Coping  Interventions: CBT, DBT, Supportive and Reframing  Summary: Clinician facilitated a group "Check-in". Patient's were allowed to process his/her feelings as it relates to depression, anxiety, lack of sleep, appetite, medication?issues, etc.   Therapist Response: Patient is dressed in appropriate and casual clothing, alert, oriented x4 with normal speech and normal motor behavior. Eye contact is good. Pt's mood is appropriate and affect is congruent with?mood.?Thought process is coherent and relevant. Pt's insight is appropriate and judgment is good. There is no indication Pt is currently responding to internal stimuli or experiencing delusional thought content. Pt was cooperative throughout the group session on this day.?      Session Time: 10:00 - 11:00  Participation Level: Active  Behavioral Response: CasualAlertDepressed  Type of Therapy: Group Therapy; psychotherapy; psychoeducation  Treatment Goals addressed: Coping  Interventions: CBT, DBT, Solution Focused, Supportive and Reframing  Summary: Counselor facilitated a group discussion "Boundaries". Patients were provided a handout as a tool to assist in discussing the topic of boundaries. Patients were provided an education on "Boundaries", which are limits, and rules we set for ourselves within relationships. Clients were informed of the common traits associated with boundaries (rigid, porous, and healthy) boundaries. Clinician asked clients to think about a person, or a group of people, which whom they struggle to set healthy boundaries. Patient was then asked to provide specific actions that may be taken to improve those boundaries? How do you think the other person will respond to  the changes? How do you think your life will be different once you've established healthy boundaries?   Therapist Response:  Clients were allowed to process and group members provided feedback. The?client's?were allowed to share and explore experiences. Clinician was able to process those experiences with the client. The processing session granted clients an opportunity to recognize and ponder on setting healthier boundaries.         Session Time: 11:00 -12:15  Participation Level: Active  Behavioral Response: CasualAlertDepressed  Type of Therapy: Group Therapy, psychotherapy  Treatment Goals addressed: Coping  Interventions: Strengths based, reframing, Supportive  Summary:  OT group  Therapist Response: Patient engaged in group. See occupational therapy note.        Session Time: 12:15 - 1:00  Participation Level: Active  Behavioral Response: CasualAlertDepressed  Type of Therapy: Group Therapy  Treatment Goals addressed: Coping  Interventions: Positive Psychology Training, Supportive  Summary:  Group viewed TED talk "How to practice emotional first aid" and had discussion on ways they can increase the ways they take care of their mental health.  Therapist Response: Patient engaged and participated appropriately. Pt denies SI/HI at end of group.       Plan: Pt will continue in PHP while working to decrease anxiety and trauma symptoms. Pt will work to be able to manage symptoms and resume daily tasks  Diagnosis: Generalized anxiety disorder [F41.1]    1. Generalized anxiety disorder       Melynda Ripple, Counselor

## 2018-06-08 NOTE — Psych (Signed)
   [facility] BH PHP THERAPIST PROGRESS NOTE  Dylan Guerrero [id number]  Session Time: 9:00 - 10:30  Participation Level:Active  Behavioral Response:CasualAlertDepressed  Type of Therapy: Group Therapy, pharmacy group  Treatment Goals addressed: Medication compliance  Interventions:Psychoeducation  Summary:Medication group with pharmacist  Therapist Response: Patient engaged in group.      Session Time: 10:30 - 12:15  Participation Level: Active  Behavioral Response: CasualAlertDepressed  Type of Therapy: Group Therapy  Treatment Goals addressed: Coping  Interventions: CBT, DBT, Supportive and Reframing  Summary: Patient provided with an opportunity to process current symptoms, thoughts, etc. Facilitator encouraged group feedback and response. Dylan Guerrero focused on expressing and coping with feelings. He was asked to brainstorm all the names of feelings he can. He listed 3 feelings as counselor facilitated descriptions of feelings from other group members. This processing session assessed how aware Dylan Guerrero is of his feelings. He was reluctant to talk about times he felt negative feelings such as guilt, shame, overwhelmed, disappointed or loved. He reported it's hard for him to trust others and he doesn't feel as if others will understand him. We further explored ways he currently uses to express feelings. Dylan Guerrero indicated that he likes to write and would be willing to try writing in a journal as an outlet to express feelings. Clinician also facilitated mindfulness "deep breathing" tools to utilize with negative feelings.     Therapist Response:  Dylan Guerrero presents with no depression symptoms. No disruption of sleep and anhedonia. He reports sleeping 2 hours last night. He reports feeling fatigue due to lack of sleep. He displays high motivation and reports that his appetite is normal. He arrived on time to [facility]. His affect was appropriate. Mood was congruent with  affect. Energy level is appropriate. Patient engaged in the group processing session and the discussion of feelings. His level of participation was exceptional. Patient was open to suggestions from peers. Patient denies SI/HI thoughts today.        Session Time: 12:15 - 1:00  Participation Level: Active  Behavioral Response: CasualAlertDepressed  Type of Therapy: Group Therapy, Activity Therapy  Treatment Goals addressed: Coping  Interventions: Psychologist, occupational, Supportive  Summary:  Reflection Group: Patients encouraged to practice skills and interpersonal techniques or work on mindfulness and relaxation techniques. The importance of self-care and making skills part of a routine to increase usage were stressed   Therapist Response: Patient engaged and participated appropriately.       Session Time: 1:00 - 2:00  Participation Level:Active  Behavioral Response:CasualAlertDepressed  Type of Therapy: Group Therapy, OT  Treatment Goals addressed: Coping  Interventions:Psychosocial skills training, Supportive,   Summary:Occupational Therapy group  Therapist Response: Patient engaged in group. See OT note.   Patient denies SI/HI/self-harm at the end of group.     Plan: Pt will continue in PHP while working to decrease anxiety and trauma symptoms. Pt will work to be able to manage symptoms and resume daily tasks  Diagnosis: Generalized anxiety disorder [F41.1]    1. Generalized anxiety disorder       Melynda Ripple, Counselor

## 2018-06-09 ENCOUNTER — Other Ambulatory Visit (HOSPITAL_COMMUNITY): Payer: Self-pay

## 2018-06-09 ENCOUNTER — Ambulatory Visit (HOSPITAL_COMMUNITY): Payer: Self-pay

## 2018-06-09 NOTE — Psych (Signed)
   [facility] BH PHP THERAPIST PROGRESS NOTE  CHRISTIOPHER RAIOLA [id number]  Session Time: 9:00 - 11:00  Participation Level: Active  Behavioral Response: CasualAlertAnxious  Type of Therapy: Group Therapy  Treatment Goals addressed: Coping  Interventions: CBT, DBT, Solution Focused, Supportive and Reframing  Summary: Clinician led check-in regarding current stressors and situation, and review of patient completed daily inventory. Clinician utilized active listening and empathetic response and validated patient emotions. Clinician facilitated processing group on pertinent issues.   Therapist Response: Dylan Guerrero is a 42 y.o. male who presents with anxiety and trauma symptoms. Patient arrived within time allowed and reports that he is feeling "good" Patient rates his mood at a 10 on a scale of 1-10 with 10 being great. Pt states he had a good weekend spent with his family and that it was "nice to have company." Pt reports determining a move date for 10/5 and that he has begun actively packing for his move. Pt reports some issues with his sleep and shares he has not been utilizing his Trazodone prescription regularly as he does not want to "rely" on it. Patient reports wanting to work on ways to become engaged in his new community. Pt able to process. Patient engaged in discussion.       Session Time: 11:00 -12:00   Participation Level: Active   Behavioral Response: CasualAlertDepressed   Type of Therapy: Group Therapy, OT   Treatment Goals addressed: Coping   Interventions: Psychosocial skills training, Supportive,    Summary:  Occupational Therapy group   Therapist Response: Patient engaged in group. See OT note.         Session Time: 12:00 - 12:45  Participation Level: Active  Behavioral Response: CasualAlertDepressed  Type of Therapy: Group Therapy, Activity Therapy  Treatment Goals addressed: Coping  Interventions: Psychologist, occupational, Supportive  Summary:   Reflection Group: Patients encouraged to practice skills and interpersonal techniques or work on mindfulness and relaxation techniques. The importance of self-care and making skills part of a routine to increase usage were stressed   Therapist Response: Patient engaged and participated appropriately.        Session Time: 12:45 - 2:00   Participation Level: Active   Behavioral Response: CasualAlertDepressed   Type of Therapy: Group Therapy, Psychotherapy   Treatment Goals addressed: Coping   Interventions: CBT, Solution focused, Supportive, Reframing   Summary:  Cln continued discussion on distress tolerance. Cln introduced ACCEPTS skills. Group discussed "A" activities and "C" contributions and how they can utilize them.     Therapist Response: Patient engaged in group.     Suicidal/Homicidal: Nowithout intent/plan    Plan: Pt will continue in PHP while working to decrease anxiety and trauma symptoms. Pt will work to be able to manage symptoms and resume daily tasks.   Diagnosis: Generalized anxiety disorder [F41.1]    1. Generalized anxiety disorder      Donia Guiles, LCSW 06/10/18

## 2018-06-09 NOTE — Psych (Signed)
   [facility] BH PHP THERAPIST PROGRESS NOTE  RENAUD WALLACK [id number]  Session Time: 9:00 - 11:00  Participation Level: Active  Behavioral Response: CasualAlertAnxious  Type of Therapy: Group Therapy  Treatment Goals addressed: Coping  Interventions: CBT, DBT, Solution Focused, Supportive and Reframing  Summary: Clinician led check-in regarding current stressors and situation, and review of patient completed daily inventory. Clinician utilized active listening and empathetic response and validated patient emotions. Clinician facilitated processing group on pertinent issues.   Therapist Response: MAKEEN GREENOUGH is a 42 y.o. male who presents with anxiety and trauma symptoms. Clinician facilitated group for processing and skills. Clients were allowed to process his/her feelings as it relates to depression, anxiety, lack of sleep, appetite, medication issues, etc. Counselor then facilitated a group discuss on "My Strengths or Qualities". Clients were asked to come up with a short list of strengths and qualities. Clients were allowed to process and group members provided feedback. The client's were involved in sharing and exploring experiences. Clinician was able to process those experiences with the client. The processing session granted clients an opportunity to build a positive self image. Patient engaged in discussion.         Session Time: 11:00 -12:15   Participation Level: Active   Behavioral Response: CasualAlertDepressed   Type of Therapy: Group Therapy, psychotherapy   Treatment Goals addressed: Coping   Interventions: Strengths based, reframing, Supportive,    Summary:  Spiritual Care group   Therapist Response: Patient engaged in group. See chaplain note.               Session Time: 12:15 - 1:00   Participation Level: Active   Behavioral Response: CasualAlertDepressed   Type of Therapy: Group Therapy, Activity Therapy   Treatment Goals addressed: Coping    Interventions: Psychologist, occupational, Supportive   Summary:  Reflection Group: Patients encouraged to practice skills and interpersonal techniques or work on mindfulness and relaxation techniques. The importance of self-care and making skills part of a routine to increase usage were stressed    Therapist Response: Patient engaged and participated appropriately.       Session Time: 1:00- 2:00   Participation Level: Active   Behavioral Response: CasualAlertDepressed   Type of Therapy: Group Therapy, Psychoeducation, Activity therapy   Treatment Goals addressed: Coping   Interventions: relaxation training; Supportive; Reframing   Summary: 12:45 - 1:50: Relaxation group: Cln led group focused on retraining the body's response to stress.   1:50 -2:00 Clinician led check-out. Clinician assessed for immediate needs, medication compliance and efficacy, and safety concerns     Therapist Response: Patient engaged in activity and discussion. At check-out, patient denies SI/HI/self-harm thoughts at the end of group.    Suicidal/Homicidal: Nowithout intent/plan   Plan: Pt will continue in PHP while working to decrease anxiety and trauma symptoms. Pt will work to be able to manage symptoms and resume daily tasks.   Diagnosis: Generalized anxiety disorder [F41.1]    1. Generalized anxiety disorder   2. Difficulty coping      Melynda Ripple, Counselor 06/01/18

## 2018-06-10 ENCOUNTER — Encounter (HOSPITAL_COMMUNITY): Payer: Self-pay

## 2018-06-10 ENCOUNTER — Encounter (HOSPITAL_COMMUNITY): Payer: Self-pay | Admitting: Occupational Therapy

## 2018-06-10 ENCOUNTER — Other Ambulatory Visit (HOSPITAL_COMMUNITY): Payer: BC Managed Care – PPO | Admitting: Licensed Clinical Social Worker

## 2018-06-10 ENCOUNTER — Other Ambulatory Visit (HOSPITAL_COMMUNITY): Payer: BC Managed Care – PPO | Admitting: Occupational Therapy

## 2018-06-10 VITALS — BP 110/64 | HR 72 | Ht 72.5 in | Wt 256.0 lb

## 2018-06-10 DIAGNOSIS — R4589 Other symptoms and signs involving emotional state: Secondary | ICD-10-CM

## 2018-06-10 DIAGNOSIS — F070 Personality change due to known physiological condition: Secondary | ICD-10-CM

## 2018-06-10 DIAGNOSIS — F411 Generalized anxiety disorder: Secondary | ICD-10-CM

## 2018-06-10 DIAGNOSIS — F329 Major depressive disorder, single episode, unspecified: Secondary | ICD-10-CM | POA: Diagnosis not present

## 2018-06-10 NOTE — Progress Notes (Signed)
Patient presented with brighter affect, more level mood and denied any suicidal or homicidal ideations, no auditory or visual hallucinations and no plan or intent to harm self or others.  Patient denied any current problems with medication and only side effect of some increased problems with constipation at times. Patient reported taking Hydroxyzine daily and discussed this could be drying him out some so patient will monitor as reports its not a significant issue and he has just noticed it more.  Patient will keep NP informed if continues or worsens but states current medication regimen is helping as he rated his depression a 0, anxiety a 2, and hopelessness a 0 on a scale of 0-10 with 0 being none and 10 the worst he could manage.  Patients PHQ9 depression screening down to a score of a 3 today from 18 from 05/26/18.  Patient reported feeling positive about transition plans to IOP after completion of PHP groups today.  Met with patient with Ricky Ala, NP to assist patient with requesting to returning to taking a multivitamin each day as patient reported a history of low Potassium when taking HCTZ.  Collateral agreed with this and patient to keep NP informed if any changes in mood or symptoms.  Patient stable at this time and per agreement is ready for transition out of PHP to IOP services.

## 2018-06-10 NOTE — Therapy (Signed)
[facility] [place], [place], [address] Phone: [phone]   Fax:  [phone]  Occupational Therapy Treatment  Patient Details  Name: Dylan Guerrero MRN: [id number] Date of Birth: 1976-09-02 Referring Provider: Ricky Ala, NP   Encounter Date: 06/10/2018  OT End of Session - 06/10/18 1253    Visit Number  6    Number of Visits  16    Date for OT Re-Evaluation  06/23/18    Authorization Type  [employer]    OT Start Time  1100    OT Stop Time  1200    OT Time Calculation (min)  60 min    Activity Tolerance  Patient tolerated treatment well    Behavior During Therapy  [facility] for tasks assessed/performed       Past Medical History:  Diagnosis Date  . Abdominal pain   . Allergy   . Arthritis   . Carpal tunnel syndrome   . GERD (gastroesophageal reflux disease)   . Headache(784.0)   . Hernia   . Hypertension   . Hypokalemia   . Infection of the inner ear   . Migraine   . Sinus infection   . Sleep apnea   . Spider bite     Past Surgical History:  Procedure Laterality Date  . APPENDECTOMY  1984  . HERNIA REPAIR  01/30/2011   Right Inguinal Hernia repair with mesh  . HERNIA REPAIR  63/84/6659   Umbilical Hernia repair with meash    There were no vitals filed for this visit.  Subjective Assessment - 06/10/18 1252    Currently in Pain?  Other (Comment)   chronic pain       S: "Social support has been the most reliable recently, but still needs work"  O: Education given on protective factors and their importance in building resiliency to face difficult life challenges. Protective factors worksheet completed. Pt to rate current protective factors of social support, coping skills, physical health, sense of purpose, self-esteem, and healthy thinking on a scale from weak-moderate-strong. Pt then to identify the most valuable protective factor, 2 protective factors to improve, and specific goals to  accomplish this task. Pt encouraged to brainstorm with peers on ways to improve areas of weakness.  A: Pt presents to group with appropriate affect, engaged and participatory throughout session. Pt shares he would like to improve his social support and physical health. He plans to improve his social support by continuing to remain close with his family, and to explore new social opportunities in the community. He plans to improve his physical health by increasing exercise by walking further distance outdoors.   P: Education given on protective factors this date. OT will follow up to ensure successful implementation of skill as pt reintegrates into community.                OT Education - 06/10/18 1253    Education Details  education given on protective factors and how they relate to mental health    Person(s) Educated  Patient    Methods  Explanation;Handout    Comprehension  Verbalized understanding       OT Short Term Goals - 06/10/18 1254      OT SHORT TERM GOAL #1   Title  Pt will be educated on strategies to improve psychosocial skills needed to participate fully in all daily, work, and leisure activities    Time  4    Period  Weeks  Status  Achieved    Target Date  06/23/18      OT SHORT TERM GOAL #2   Title  Pt will apply psychosocial skills and coping mechanisms to daily activities in order to function independently and reintegrate into community dwelling    Time  4    Period  Weeks    Status  Achieved      OT SHORT TERM GOAL #3   Title  Pt will recall and apply sleep hygiene strategies learned to implement for increased independence in BADL routine    Time  4    Period  Weeks    Status  Achieved      OT SHORT TERM GOAL #4   Title  Pt will recall and apply 1-2 new social opportunities within desired comfort level to increase successful community engagement/reintegration     Time  4    Period  Weeks    Status  Achieved               Plan -  06/10/18 1253    Occupational performance deficits (Please refer to evaluation for details):  ADL's;IADL's;Rest and Sleep;Work;Leisure;Social Participation       Patient will benefit from skilled therapeutic intervention in order to improve the following deficits and impairments:  Decreased coping skills, Decreased psychosocial skills, Other (comment)(decreased ability to engage in BADL and reintegrate into community)  Visit Diagnosis: Organic personality disorder  Difficulty coping    Problem List Patient Active Problem List   Diagnosis Date Noted  . MDD (major depressive disorder), single episode, severe , no psychosis ([place]) 05/19/2018  . Generalized anxiety disorder   . Panic disorder   . Posttraumatic stress disorder   . Morbid obesity due to excess calories ([place]) 04/11/2017  . Essential hypertension 04/11/2017  . DOE (dyspnea on exertion) 04/09/2017  . Migraine with aura 01/09/2013  . Umbilical pain 62/69/4854    OCCUPATIONAL THERAPY DISCHARGE SUMMARY  Visits from Start of Care: 6  Current functional level related to goals / functional outcomes: Independent, pt is stepping down to IOP level of care   Remaining deficits: To continue to consistently implement coping skills   Education / Equipment: Education given on psychosocial and coping mechanisms to apply to BADL/IADL routine to increase successful integration into community.  Plan: Patient agrees to discharge.  Patient goals were met. Patient is being discharged due to meeting the stated rehab goals.  ?????         Zenovia Jarred, MSOT, OTR/L  [place] 06/10/2018, 12:55 PM  [facility] PARTIAL HOSPITALIZATION PROGRAM [place], [place], [address] Phone: [phone]   Fax:  [phone]  Name: Dylan Guerrero MRN: [id number] Date of Birth: December 04, 1975

## 2018-06-10 NOTE — Progress Notes (Signed)
  [facility] Partial Outpatient Program Discharge Summary  Dylan Guerrero [id number]  Admission date: 05/27/2018 Discharge date: 06/10/2018  Reason for admission: Patient is a 42 y.o. African American male presents with worsing depression and anxiety.  Patient was recently discharged from inpatient admission related to the above symptoms.  Reports he feels his symptoms got worse [place].  States " having been right every since" reports this is his first inpatient admission.  Reports he was previously followed by psychiatrist however had discontinued his services due to "conflict of interest" states he has just been managing his mood depression and anxiety itself.  Chart reviewed.  Patient validates the information provided in below assessment.  Dylan Guerrero has concerns with gabapentin 300 mg states feeling tremors and unsteady.  Offered to decrease medication patient has declined as he reports 100 mg of gabapentin makes him feel too sedated.  Will discontinue gabapentin and initiate Cymbalta 30 mg p.o. Daily.  Patient was agreeable to treatment plan.patient was enrolled in partial psychiatric program on 05/27/18.   Progress in Program Toward Treatment Goals: Dylan Guerrero attended and participated with daily group session while in PHP.  Reports feeling better overall. Dylan Guerrero has concerns with restarting multivitamin. Patient will be stepping down to intensive outpatient programing.   Progress (rationale):  Stepping down to IOP  Take all medications as prescribed. Keep all follow-up appointments as scheduled.  Do not consume alcohol or use illegal drugs while on prescription medications. Report any adverse effects from your medications to your primary care provider promptly.  In the event of recurrent symptoms or worsening symptoms, call 911, a crisis hotline, or go to the nearest emergency department for evaluation.   Dylan Rack, NP 06/10/2018

## 2018-06-13 ENCOUNTER — Other Ambulatory Visit (HOSPITAL_COMMUNITY): Payer: BC Managed Care – PPO

## 2018-06-13 ENCOUNTER — Encounter (HOSPITAL_COMMUNITY): Payer: Self-pay

## 2018-06-13 ENCOUNTER — Other Ambulatory Visit (HOSPITAL_COMMUNITY): Payer: BC Managed Care – PPO | Admitting: Psychiatry

## 2018-06-13 ENCOUNTER — Encounter (HOSPITAL_COMMUNITY): Payer: Self-pay | Admitting: Psychiatry

## 2018-06-13 DIAGNOSIS — F329 Major depressive disorder, single episode, unspecified: Secondary | ICD-10-CM | POA: Diagnosis not present

## 2018-06-13 DIAGNOSIS — F411 Generalized anxiety disorder: Secondary | ICD-10-CM
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
## 2018-06-13 NOTE — Progress Notes (Signed)
    Daily Group Progress Note  Program: IOP  Group Time: 9am-12pm  Participation Level: Active  Behavioral Response: Appropriate  Type of Therapy:  Group Therapy; psychoeducational group, process group  Summary of Progress:  9am-10am Clinician checked in with group members, assessing for SI/HI/psychosis and level of functioning. Clinician inquired about goals met over the weekend and any skills attempted. Clinician praised members for progress.  Pharmacist facilitated discussion for first part of the group. Pharmacist provided education about medication and side effects. Pharmacist allowed time for clients to ask questions about medications and side effects.  10am-11am Clinician presented the topic of Co-dependency Characteristics. Clinician described co-dependency and how this affects communication styles and relationships with ourselves and others. Clinician requested group members complete Codependency Characteristics Scale to identify traits of Caretaking, Self-Worth and Dependency. Clinician and group members processed how this can currently been seen in their daily lives.  11am-12pm Clinician facilitated discussion with group members about levels of attachment, family dysfunction and self worth, dependency and boundary needs, communication skills, and responsibilities. Clinician and group members processed thoughts and feelings related to these behaviors, and desires to change as well as boundaries to changing.  Clinician challeneged each group member to create one moment of joy over the rest of the day.  Client attended first IOP group. Client was oriented to group by case manager, see case management note. Client verbalized low scores for all codependency characteristics with minimal concerns related to interpersonal interactions. Client presented fully oriented, with appropriate mood and affect. Client did engage in discussions with group members.  Olegario Messier, LCSW

## 2018-06-14 ENCOUNTER — Other Ambulatory Visit (HOSPITAL_COMMUNITY): Payer: Self-pay

## 2018-06-14 ENCOUNTER — Ambulatory Visit (HOSPITAL_COMMUNITY): Payer: Self-pay

## 2018-06-14 ENCOUNTER — Other Ambulatory Visit (HOSPITAL_COMMUNITY): Payer: BC Managed Care – PPO

## 2018-06-15 ENCOUNTER — Other Ambulatory Visit (HOSPITAL_COMMUNITY): Payer: BC Managed Care – PPO | Admitting: Licensed Clinical Social Worker

## 2018-06-15 ENCOUNTER — Other Ambulatory Visit (HOSPITAL_COMMUNITY): Payer: Self-pay

## 2018-06-15 DIAGNOSIS — F329 Major depressive disorder, single episode, unspecified: Secondary | ICD-10-CM | POA: Diagnosis not present

## 2018-06-15 DIAGNOSIS — F411 Generalized anxiety disorder: Secondary | ICD-10-CM

## 2018-06-15 NOTE — Psych (Signed)
   [facility] BH PHP THERAPIST PROGRESS NOTE  Dylan Guerrero [id number]  Session Time: 9:00 - 11:00  Participation Level: Active  Behavioral Response: CasualAlertAnxious  Type of Therapy: Group Therapy  Treatment Goals addressed: Coping  Interventions: CBT, DBT, Solution Focused, Supportive and Reframing  Summary: Clinician led check-in regarding current stressors and situation, and review of patient completed daily inventory. Clinician utilized active listening and empathetic response and validated patient emotions. Clinician facilitated processing group on pertinent issues.   Therapist Response: Dylan Guerrero is a 42 y.o. male who presents with anxiety and trauma symptoms. Patient arrived within time allowed and reports that he is feeling "drained" Patient rates his mood at a 8 on a scale of 1-10 with 10 being great. Pt reports he is feeling good and was up late packing up his attic. Pt reports he is beginnig to think about how he will transition care when he moves.  Pt able to process. Patient engaged in discussion.         Session Time: 11:00 -12:15  Participation Level: Active  Behavioral Response: CasualAlertDepressed  Type of Therapy: Group Therapy, psychotherapy  Treatment Goals addressed: Coping  Interventions: Strengths based, reframing, Supportive,   Summary:  Spiritual Care group  Therapist Response: Patient engaged in group. See chaplain note.          Session Time: 12:15 - 1:00  Participation Level: Active  Behavioral Response: CasualAlertDepressed  Type of Therapy: Group Therapy, Activity Therapy  Treatment Goals addressed: Coping  Interventions: Psychologist, occupational, Supportive  Summary:  Reflection Group: Patients encouraged to practice skills and interpersonal techniques or work on mindfulness and relaxation techniques. The importance of self-care and making skills part of a routine to increase usage were stressed   Therapist Response:  Patient engaged and participated appropriately.         Session Time: 1:00- 2:00  Participation Level: Active  Behavioral Response: CasualAlertDepressed  Type of Therapy: Group Therapy, Psychoeducation, Activity therapy  Treatment Goals addressed: Coping  Interventions: relaxation training; Supportive; Reframing  Summary: 12:45 - 1:50: Relaxation group: Cln led group focused on retraining the body's response to stress.   1:50 -2:00 Clinician led check-out. Clinician assessed for immediate needs, medication compliance and efficacy, and safety concerns   Therapist Response: Patient engaged in activity and discussion. At check-out, patient rates her mood at a 10 on a scale of 1-10 with 10 being great. Patient reports afternoon plans of more packing. Patient demonstrates some progress as evidenced by increased goal directed behavior. Patient denies SI/HI/self-harm thoughts at the end of group.      Suicidal/Homicidal: Nowithout intent/plan    Plan: Pt will continue in PHP while working to decrease anxiety and trauma symptoms. Pt will work to be able to manage symptoms and resume daily tasks.   Diagnosis: Generalized anxiety disorder [F41.1]    1. Generalized anxiety disorder      Donia Guiles, LCSW 06/15/18

## 2018-06-15 NOTE — Progress Notes (Signed)
    Daily Group Progress Note  Program: IOP  Group Time: 9am-12pm  Participation Level: Active  Behavioral Response: Appropriate  Type of Therapy:  Group Therapy; process group, psycho-educational group  Summary of Progress:  9am-11am Clinician provided video and facilitated follow up discussion on 'Unconditional positive regard--the power of self acceptance." Clinician and group members discussed self forgiveness and reviewed DBT skill Turning the Mind. Clinician presented the topic of 'Growth vs. Fixed Mindset." Clinician facilitated discussion with group members on what Fixed and Growth mindset were. Clinician provided clients with psycho-educational handout on The Mindset Continuum and processed where clients landed in several categories. Clinician and group members reviewed '25 Ways to Build a Growth Mindset' and discussed how some of the skills can be incorporated into daily life. Clinician prompted group to practice changing Fixed Mindset phrases into Growth Mindset phrases. Clinician praised group members for discussion and encouragement of other group members.  11am-12pm Chaplin facilitated discussion on grief and loss. Chaplin inquired about types of losses felt by group members and how they grieve. Chaplin and group members discussed how the way they saw grieving handled growing up effected how they grieve now. Client presented fully oriented with appropriate dress and congruent affect. Client participated fully in all group activities and discussions. Client was able to verbalize where he was on the mindset continuum for several topics, including challenges, feedback, and offered support. Client reports being around mixed or growth mindset overall. Some barriers to growth mindset included who else was involved, for example being particular to who was providing help or feedback. Client reports loss of some freedom/independence as well as social situations. Client identifies that he is  gaining these back, however not as quickly as he would like.   Harlon Ditty, LCSW

## 2018-06-16 ENCOUNTER — Ambulatory Visit (HOSPITAL_COMMUNITY): Payer: Self-pay

## 2018-06-16 ENCOUNTER — Other Ambulatory Visit (HOSPITAL_COMMUNITY): Payer: BC Managed Care – PPO | Admitting: Licensed Clinical Social Worker

## 2018-06-16 ENCOUNTER — Other Ambulatory Visit (HOSPITAL_COMMUNITY): Payer: Self-pay

## 2018-06-16 ENCOUNTER — Other Ambulatory Visit (HOSPITAL_COMMUNITY): Payer: Self-pay | Admitting: Psychiatry

## 2018-06-16 DIAGNOSIS — F329 Major depressive disorder, single episode, unspecified: Secondary | ICD-10-CM | POA: Diagnosis not present

## 2018-06-16 DIAGNOSIS — F411 Generalized anxiety disorder: Secondary | ICD-10-CM

## 2018-06-16 MED ORDER — DULOXETINE HCL 30 MG PO CPEP: 30.0000 mg | ORAL_CAPSULE | Freq: Two times a day (BID) | ORAL | 0 refills | Status: DC

## 2018-06-16 NOTE — Progress Notes (Deleted)
Patient ID: Dylan Guerrero, male   DOB: 09-28-1976, 42 y.o.   MRN: [id number] Patient seen this morning, patient was complaining of dizziness, discussed in length his medications, discussed increasing the Cymbalta to 30 mg twice daily to help with his pain and also his depression and anxiety. Discontinue Celexa. Patient reported, he was only taking 10 MG daily Outpatient Encounter Medications as of 06/17/2018  Medication Sig Note  . albuterol (PROVENTIL HFA;VENTOLIN HFA) 108 (90 Base) MCG/ACT inhaler Inhale 2 puffs into the lungs every 6 (six) hours as needed for wheezing or shortness of breath.   Marland Kitchen. amLODipine (NORVASC) 10 MG tablet Take 10 mg by mouth daily.   . bisoprolol (ZEBETA) 5 MG tablet TAKE 1 TABLET BY MOUTH EVERY DAY (Patient taking differently: Take 5 mg by mouth daily. )   . DULoxetine (CYMBALTA) 30 MG capsule Take 1 capsule (30 mg total) by mouth 2 (two) times daily.   . fluticasone (FLONASE) 50 MCG/ACT nasal spray Place 1 spray into both nostrils daily as needed for allergies or rhinitis.    . Glycerin-Hypromellose-PEG 400 (ARTIFICIAL TEARS) 0.2-0.2-1 % SOLN Place 1-2 drops into both eyes 2 (two) times daily.   . hydrochlorothiazide (HYDRODIURIL) 25 MG tablet Take 25 mg by mouth daily.   . hydrocortisone cream 1 % Apply 1 application topically 2 (two) times daily as needed for itching (RASH).   . hydrOXYzine (ATARAX/VISTARIL) 25 MG tablet Take 1 tablet (25 mg total) by mouth 3 (three) times daily as needed for anxiety. 05/27/2018: Typically takes twice a day.   . linaclotide (LINZESS) 145 MCG CAPS capsule Take 145 mcg by mouth daily.   Marland Kitchen. loratadine (CLARITIN) 10 MG tablet Take 10 mg by mouth daily.   . meclizine (ANTIVERT) 25 MG tablet Take 25 mg by mouth 2 (two) times daily.    Marland Kitchen. omeprazole (PRILOSEC) 40 MG capsule TAKE 1 CAPSULE BY MOUTH EVERY DAY (Patient taking differently: Take 40 mg by mouth daily. )   . traZODone (DESYREL) 50 MG tablet Take 1 tablet (50 mg total) by mouth at bedtime  as needed for sleep.   Marland Kitchen. triamcinolone cream (KENALOG) 0.1 % Apply 1 application topically as needed (for rashes).    Marland Kitchen. UNABLE TO FIND CPAP at bedtime    No facility-administered encounter medications on file as of 06/17/2018.

## 2018-06-17 ENCOUNTER — Other Ambulatory Visit (HOSPITAL_COMMUNITY): Payer: BC Managed Care – PPO

## 2018-06-17 ENCOUNTER — Other Ambulatory Visit (HOSPITAL_COMMUNITY): Payer: Self-pay

## 2018-06-17 ENCOUNTER — Ambulatory Visit (HOSPITAL_COMMUNITY): Payer: Self-pay

## 2018-06-17 NOTE — Progress Notes (Signed)
    Daily Group Progress Note  Program: IOP  Group Time: 9am-12pm  Participation Level: Active  Behavioral Response: Appropriate  Type of Therapy:  Group Therapy  Summary of Progress:  Clinician checked in with group members assessing for SI/HI/psychosis/substance use. Clinician inquired if self care activity from previous day was completed.  Clinician presented the topic of the importance of language in mental health. Clinician provided video on 'Fault vs Responsibility' and how the differences in language can effect how we respond to situations. Clinician and group members processed differences in fault vs responsibility; victim vs survivor; fair vs just. Clinician and group members processed the difference in thoughts and feelings with each set of compared words.  Clinician utilized worksheet focused on the topic of Boundaries. Clinician defined boundaries and reviewed four types of personalities related to boundaries. Clinician facilitated discussion of having mixed traits depending on the situation. Clinician provided psychoeducation on traits and behaviors vs diagnostic criteria, primary difference is traits causing dysfunction in daily living. Clinician facilitated discussion of examples of How to Set Boundaries with words, distance, time, people, and consequences. Clinician processed with group members the importance of boundaries as it relates to personal mental health. Clinician provided clients with 'I think... I feel... I need...' skill and discussed the effect of using person centered language can help effective communication.  Client reports 'loving people from a distance' has been helpful. Client reports his partner and brother are good supports for him with healthy boundaries. Client participated fully in all discussions and activities. Client did not endorse SI/HI/psychosis.  Harlon DittyKarissa A Brone, LCSW

## 2018-06-20 ENCOUNTER — Other Ambulatory Visit: Payer: Self-pay | Admitting: Internal Medicine

## 2018-06-20 ENCOUNTER — Other Ambulatory Visit (HOSPITAL_COMMUNITY): Payer: BC Managed Care – PPO | Admitting: Licensed Clinical Social Worker

## 2018-06-20 DIAGNOSIS — F411 Generalized anxiety disorder: Secondary | ICD-10-CM

## 2018-06-20 DIAGNOSIS — F329 Major depressive disorder, single episode, unspecified: Secondary | ICD-10-CM | POA: Diagnosis not present

## 2018-06-20 NOTE — Psych (Signed)
   [facility]  Antonieta IbaCraig L Denley [id number]  Session Time: 9:00 - 10:00  Participation Level: Active  Behavioral Response: CasualAlertEuthymic  Type of Therapy: Group Therapy  Treatment Goals addressed: Coping  Interventions: CBT, DBT, Solution Focused, Supportive and Reframing  Summary: Clinician led check-in regarding current stressors and situation, and review of patient completed daily inventory. Clinician utilized active listening and empathetic response and validated patient emotions. Clinician facilitated processing group on pertinent issues.   Therapist Response: Antonieta IbaCraig L Najera is a 42 y.o. male who presents with anxiety and trauma symptoms. Patient arrived within time allowed and reports that he is feeling "energetic" Patient rates his mood at a 10 on a scale of 1-10 with 10 being great. Pt reports he had a productive day yesterday and was able to pack and prepare his home to move some more. Pt reports feeling optimistic about discharge and his progress. Pt able to process. Patient engaged in discussion.       Session Time: 10:00 - 11:00  Participation Level: Active  Behavioral Response: CasualAlertDepressed  Type of Therapy: Group Therapy, Psychotherapy  Treatment Goals addressed: Coping  Interventions: CBT, Solution focused, Supportive, Reframing  Summary:  Clinician facilitated discussion on positive self talk. Group discussed ways in which their self talk played a role in their thought patterns and self esteem.      Therapist Response: Pt reports his self talk tends to be negative and he can see how it hindered him in the past in terms of taking new opportunities. Pt states he feels his self talk is improving.        Session Time: 11:00 -12:00   Participation Level: Active   Behavioral Response: CasualAlertDepressed   Type of Therapy: Group Therapy, OT   Treatment Goals addressed: Coping   Interventions: Psychosocial skills  training, Supportive,    Summary:  Occupational Therapy group   Therapist Response: Patient engaged in group. See OT note.            Session Time: 12:00 - 1:00   Participation Level: Active   Behavioral Response: CasualAlertDepressed   Type of Therapy: Group Therapy, Psychotherapy   Treatment Goals addressed: Coping   Interventions: CBT, Solution focused, Supportive, Reframing   Summary:  12:00 - 12:50  Cln continued discussion on distress tolerance. Cln continued ACCEPTS skills. Group discussed "P" pushing away and "S" sensations and how they can utilize them.    12:50 - 1:00 Clinician led check-out. Clinician assessed for immediate needs, medication compliance and efficacy, and safety concerns   Therapist Response: Patient engaged in group. Pt reports pushing away as way he can utilize these skills.  At check-out, patient rates his mood at a 10 on a scale of 1-10 with 10 being great. Patient reports afternoon plans of packing.  Patient demonstrates some progress as evidenced by noting overall progress of improved mood. Patient denies SI/HI/self-harm at the end of group.   Suicidal/Homicidal: Nowithout intent/plan    Plan: Pt will discharge from PHP due to meeting treatment goals of decreased anxiety symptoms and increased ability to manage symptoms. Pt's progress is noted by observation, self report, and scales. Pt and provider are aligned with discharge. Pt will step down to IOP within this agency beginning on 06/13/18. Pt denies SI/HI at time of discharge.   Diagnosis: Generalized anxiety disorder [F41.1]    1. Generalized anxiety disorder      Donia GuilesJenny Conner Neiss, LCSW 06/10/18

## 2018-06-20 NOTE — Progress Notes (Signed)
    Daily Group Progress Note  Program: IOP  Group Time: 9am-12pm  Participation Level: Minimal  Behavioral Response: Appropriate  Type of Therapy:  Group Therapy  Summary of Progress: 9am-10:30am Clinician checked in with group members, assessing for SI/HI/psychosis. Clinician inquired about completion of self care activities over the weekend. Clinician facilitated stretching exercise.  Clinician presented psychoeducational material on ' Feelings, Thoughts, and Mind Traps: A Guide to Fortune BrandsMind Traps' from [employer] curriculum. Clinician facilitated discussion with clients on types of distorted thinking styles and how to challenge thoughts. Clinician allowed time for clients to discuss examples of distorted thoughts as well as help other group members create more helpful thoughts and phrases. Clinician wrapped up with Steps for challenging mind traps.  10:30-12pm Clinician continued with Roadblocks to Healthy Thinking 'Ways of Thinking That Keep You Stuck.' Clinician facilitated conversation related to how distorted thinking effects behaviors. Clinician reminded group members of Cognitive Triangle and the connection between thoughts, feelings, and behaviors. Clinician and group members discussed when examples are genuine (ex: confusion) vs when they are unhelpful and help avoid taking responsibility for own behaviors.  Clinician closed requested a self care activity planned for the rest of the day. Client reports he drove himself to group this day, which was an accomplishment. Client was able to verbalize how distorted thinking styles could effect the intensity of unhelpful feelings. Client noted he is often able to create more helpful or realistic thoughts. Client actively listed to all discussions and participated in group activities.  Harlon DittyKarissa A Adelynn Gipe, LCSW

## 2018-06-21 ENCOUNTER — Other Ambulatory Visit (HOSPITAL_COMMUNITY): Payer: BC Managed Care – PPO | Admitting: Licensed Clinical Social Worker

## 2018-06-21 DIAGNOSIS — F329 Major depressive disorder, single episode, unspecified: Secondary | ICD-10-CM | POA: Diagnosis not present

## 2018-06-21 DIAGNOSIS — F411 Generalized anxiety disorder: Secondary | ICD-10-CM

## 2018-06-21 NOTE — Progress Notes (Signed)
    Daily Group Progress Note  Program: IOP  Group Time: 9am - 12pm  Participation Level: Active  Behavioral Response: Appropriate  Type of Therapy:  Group Therapy  Summary of Progress:  9am-10am Clinician checked in with clients, assessing for overall level of functioning inquiring about positive self trait and completed self care activity from previous day.  Clinician and group members continued with Roadblocks to Healthy Thinking from [facility] curriculum, and processed the benefits or problems related to accepting responsibility for own behaviors. Clinician and group members reviewed 'Tips for Breaking A Cycle' including awareness, honesty, motivation, and thought stopping.  10am- 11am Clinician presented Interpersonal Effectiveness Skills, including D.E.A.R.M.A.N, G.I.V.E., and F.A.S.T.. Clinician and group members processed the effect of healthy vs unhealthy communication skills on mental health symptoms and overall wellness. Clinician reviewed skills with clients and encouraged clients to utilize skill in session. Clinician praised clients for attempting skill and being receptive to feedback. Clinician clarified the reason for each skill, objective effectiveness, relationship effectiveness, and self-respect effectiveness.  11am-12pm Chaplin processed issues related to grief and loss. Client actively listened to discussions and participated in all group activities. Client reported he completed his self care activity from the previous day. Client reports overall assertive communication skills with limited roadblocks to healthy thinking.   Harlon DittyKarissa A Shaiann Mcmanamon, LCSW

## 2018-06-22 ENCOUNTER — Other Ambulatory Visit (HOSPITAL_COMMUNITY): Payer: BC Managed Care – PPO | Admitting: Licensed Clinical Social Worker

## 2018-06-22 DIAGNOSIS — F411 Generalized anxiety disorder: Secondary | ICD-10-CM

## 2018-06-22 DIAGNOSIS — F329 Major depressive disorder, single episode, unspecified: Secondary | ICD-10-CM | POA: Diagnosis not present

## 2018-06-22 NOTE — Progress Notes (Signed)
    Daily Group Progress Note  Program: IOP  Group Time: 9am - 12pm  Participation Level: Active  Behavioral Response: Appropriate  Type of Therapy:  Group Therapy  Summary of Progress: Clinician checked in with group members, inquiring about completed self care activity from previous day.  Clinician presented the topic of anger as a secondary emotion. Clinician provided clients the opportunity to discuss initial thoughts related to anger. Clinician facilitated discussion about how and where expressing anger is learned, such as the family growing up. Clinician inquired how this has effected current communication styles and expression of anger.  Clinician processed with clients thoughts and emotions 'under' anger, and provided worksheet on 'Anger [place].' Clinician validated client thoughts and emotions and praised clients for identifying additional feelings resulting in displaying anger.  Clinician presented Tip 52: Hallmarks of Effective Communication. Clinician and clients reviewed alternate statements for effective communication which could be used in discussions  Asher MuirJamie from [facility] attended group. She presented information on setting small goals for healthy lifestyle and making small improvements at a time. Discussion included nutrition, exercise, and sleep hygiene.  Client shared frustration with co-worker. Client processed what was most frustrating and determined the coworking being 'a snake' and dishonest likely caused his frustration. Client shared how he responded in the workplace. Client was supportive of other group members and actively listened to all conversations.  Harlon DittyKarissa A Dashan Chizmar, LCSW

## 2018-06-23 ENCOUNTER — Other Ambulatory Visit (HOSPITAL_COMMUNITY): Payer: BC Managed Care – PPO | Admitting: Licensed Clinical Social Worker

## 2018-06-23 DIAGNOSIS — F411 Generalized anxiety disorder: Secondary | ICD-10-CM

## 2018-06-23 DIAGNOSIS — F329 Major depressive disorder, single episode, unspecified: Secondary | ICD-10-CM | POA: Diagnosis not present

## 2018-06-23 NOTE — Progress Notes (Signed)
    Daily Group Progress Note  Program: IOP  Group Time: 9am-12pm  Participation Level: Active  Behavioral Response: Appropriate  Type of Therapy:  Group Therapy  Summary of Progress:  The theme for the first session of group today was, "Cognitive Modeling." The therapist conducted a psychoeducational exercise engaging the group members to identify the common thoughts, feelings and behaviors that they experience and recognize how to separate the facts from the feelings that they struggle with. The therapist and group members participated in active listening and sharing of insights from their individual perspectives to acknowledge commonalities and differences of their particular situations.  The second part of group focused on meditation and breathing exercises and gentle movements for relaxations. The therapist demonstrated with active group participation as she led the session. The group enjoyed the session and gave positive feedback from their experience.  The patient shared that he was feeling hopeful for better days ahead. He's feeling confident of a peaceful place for his psychological, physical and emotional being.  The patient was active in group. His behavioral response was appropriate.   Harlon DittyKarissa A Evander Macaraeg, LCSW

## 2018-06-24 ENCOUNTER — Other Ambulatory Visit (HOSPITAL_COMMUNITY): Payer: BC Managed Care – PPO | Admitting: Licensed Clinical Social Worker

## 2018-06-24 DIAGNOSIS — F411 Generalized anxiety disorder: Secondary | ICD-10-CM

## 2018-06-24 DIAGNOSIS — F329 Major depressive disorder, single episode, unspecified: Secondary | ICD-10-CM | POA: Diagnosis not present

## 2018-06-24 NOTE — Progress Notes (Signed)
  [facility] Discharge Summary  Dylan IbaCraig L Guerrero [id number]  Admission date:06/13/2018 Discharge date: 06/27/2018  Reason for admission: Generalized Anxiety   Per assessment note-Patient is a 42 y.o.African Americanmale presents with worsing depression and anxiety.Patient was recently discharged from inpatient admission related to the above symptoms. Reports he feels his symptoms got worse on the bus [place].States "having been right every since"reports this is his first inpatient admission. Reports he was previously followed by psychiatrist however had discontinued his services due to "conflict of interest"states he has just been managing his mood depression and anxiety itself. Chart reviewed.Patient validates the information provided in below assessment.Dylan CatchingsCraig has concerns with gabapentin 300 mgstates feeling tremors and unsteady. Offered to decrease medication patient has declined as he reports 100 mg of gabapentin makes him feel too sedated. Will discontinue gabapentin and initiateCymbalta 30 mg p.o. Daily.Patient was agreeable to treatment plan.patient was enrolled in partial psychiatric program on 05/27/18.   Progress in Program Toward Treatment Goals: Ongoing, Dylan CatchingsCraig attended and participated with daily group session. Patient  successfully completed Partial Hospitalization Program. Report overcoming his anxiety and states he has started driving again. Reports overall is mood and anxiety has improved since his admission.   Progress (rationale): Dr Lolly MustacheArfeen 08/11/18 at 1300 and Dylan Guerrero  Take all medications as prescribed. Keep all follow-up appointments as scheduled.  Do not consume alcohol or use illegal drugs while on prescription medications. Report any adverse effects from your medications to your primary care provider promptly.  In the event of recurrent symptoms or worsening symptoms,  call 911, a crisis hotline, or go to the nearest emergency department for evaluation.   Oneta Rackanika N Lewis, NP 06/25/2018

## 2018-06-25 ENCOUNTER — Encounter (HOSPITAL_COMMUNITY): Payer: Self-pay | Admitting: Family

## 2018-06-27 ENCOUNTER — Other Ambulatory Visit (HOSPITAL_COMMUNITY): Payer: BC Managed Care – PPO | Admitting: Licensed Clinical Social Worker

## 2018-06-27 DIAGNOSIS — F411 Generalized anxiety disorder: Secondary | ICD-10-CM

## 2018-06-27 DIAGNOSIS — F329 Major depressive disorder, single episode, unspecified: Secondary | ICD-10-CM | POA: Diagnosis not present
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
## 2018-06-27 NOTE — Patient Instructions (Signed)
D:  Patient successfully completed MH-IOP today.  A:  Discharge today.  Follow up with Dr. Lolly MustacheArfeen on 08-11-18 @ 1pm and Margaretha GlassingJessie Schlosberg, LCSW on 07-18-18 @ 2:30pm.  Encouraged support groups.  R:  Patient receptive.

## 2018-06-27 NOTE — Progress Notes (Signed)
    Daily Group Progress Note  Program: IOP  Group Time: 9am - 12pm  Participation Level: Active  Behavioral Response: Appropriate  Type of Therapy:  Group Therapy  Summary of Progress:  Clinician checked in with group inquiring about completed self care activity and level of functioning.  Clinician presented psychoeducational information related to neuro-plasticity. Clinician facilitated discussion related to video.Clinician and group members discussed activities listed in video, including mindfulness and gratitude aiding coping skills. Clinician and group members practiced skill in session and group members provided feedback on challenges.  Clinician facilitated mindful walking activity.  Clinician presented general coping skills overview worksheet with pros and cons of types of coping skills including distraction, grounding,emotional release, self love, thought challenging, and accessing higher self. Clinician facilitated discussion on when each skill could be useful. Client reports improved symptoms of depression and anxiety and will be discharging from group on Monday. Client states he is driving again and spending time with his family often. Client participate in all group discussions and activities though reports he does not struggle creating more helpful thoughts most days.  Harlon DittyKarissa A Caytlyn Evers, LCSW

## 2018-06-28 ENCOUNTER — Other Ambulatory Visit (HOSPITAL_COMMUNITY): Payer: BC Managed Care – PPO

## 2018-06-28 NOTE — Progress Notes (Signed)
    Daily Group Progress Note  Program: IOP  Group Time: 9am-12pm  Participation Level: Active  Behavioral Response: Appropriate  Type of Therapy:  Group Therapy; psychoeducational group; process group  Summary of Progress:  The purpose of this group is to decrease active mental health symptoms and increase utilization of healthy skills utilizing CBT and DBT skills in a group setting. 9am-10:30am The purpose of this time in session was used for psycho-educational time with pharmacist. Clients were allowed time to inquire about medications, side effects, etc. related to medications.  10:30am-12pm Clinician presented the topic of 'Building Happiness' and reviewed worksheet. Clinician and group members processed the pros and cons of gratitude, acts of kindness, exercise, meditation, positive journaling, and fostering relationships. Clinician and group members participated in mindfulness meditation activity focused on shifting concentration from wandering to focused mind. Clinician and group members reviewed Gratitude worksheet. Clinician facilitated discussion between clients on struggles related to constant need to use coping skills rather than skills coming easily. Clinician actively listened to group member concerns and validated thoughts and feelings. Clinician utilized summarizing statements using clarifying questions to continue guiding conversation. Clinician and group members discussed how skills could be practiced in daily life, such as small acts of kindness, exercise, and meditation to help improve skill building in a non-stressful environment. Client endorsed overall positive weekend. Client is completing IOP on this day. Client reports he will continue to utilize aromatherapy as a self care skill as part of mindfulness.  Harlon DittyKarissa A Mohamadou Maciver, LCSW

## 2018-06-29 ENCOUNTER — Other Ambulatory Visit (HOSPITAL_COMMUNITY): Payer: BC Managed Care – PPO

## 2018-06-30 ENCOUNTER — Other Ambulatory Visit (HOSPITAL_COMMUNITY): Payer: BC Managed Care – PPO

## 2018-07-01 ENCOUNTER — Other Ambulatory Visit (HOSPITAL_COMMUNITY): Payer: BC Managed Care – PPO

## 2018-07-04 ENCOUNTER — Other Ambulatory Visit (HOSPITAL_COMMUNITY): Payer: BC Managed Care – PPO

## 2018-07-05 ENCOUNTER — Other Ambulatory Visit (HOSPITAL_COMMUNITY): Payer: BC Managed Care – PPO

## 2018-07-06 ENCOUNTER — Other Ambulatory Visit (HOSPITAL_COMMUNITY): Payer: BC Managed Care – PPO

## 2018-07-07 ENCOUNTER — Other Ambulatory Visit (HOSPITAL_COMMUNITY): Payer: BC Managed Care – PPO

## 2018-07-08 ENCOUNTER — Other Ambulatory Visit (HOSPITAL_COMMUNITY): Payer: BC Managed Care – PPO

## 2018-07-11 ENCOUNTER — Other Ambulatory Visit (HOSPITAL_COMMUNITY): Payer: BC Managed Care – PPO

## 2018-07-12 ENCOUNTER — Other Ambulatory Visit (HOSPITAL_COMMUNITY): Payer: BC Managed Care – PPO

## 2018-07-12 ENCOUNTER — Other Ambulatory Visit (HOSPITAL_COMMUNITY): Payer: Self-pay | Admitting: Family

## 2018-07-13 ENCOUNTER — Other Ambulatory Visit (HOSPITAL_COMMUNITY): Payer: BC Managed Care – PPO

## 2018-07-14 ENCOUNTER — Other Ambulatory Visit (HOSPITAL_COMMUNITY): Payer: BC Managed Care – PPO

## 2018-07-14 ENCOUNTER — Other Ambulatory Visit (HOSPITAL_COMMUNITY): Payer: Self-pay | Admitting: Family

## 2018-07-14 MED ORDER — DULOXETINE HCL 30 MG PO CPEP: 30.0000 mg | ORAL_CAPSULE | Freq: Two times a day (BID) | ORAL | 0 refills | Status: DC

## 2018-07-14 MED ORDER — HYDROXYZINE HCL 25 MG PO TABS: 25.0000 mg | ORAL_TABLET | Freq: Three times a day (TID) | ORAL | 0 refills | Status: DC | PRN

## 2018-07-14 NOTE — Progress Notes (Signed)
Medication was refilled as patient has a follow-up appt. With MD Afreen

## 2018-07-15 ENCOUNTER — Other Ambulatory Visit (HOSPITAL_COMMUNITY): Payer: BC Managed Care – PPO

## 2018-07-18 ENCOUNTER — Encounter (HOSPITAL_COMMUNITY): Payer: Self-pay | Admitting: Licensed Clinical Social Worker

## 2018-07-18 ENCOUNTER — Ambulatory Visit (INDEPENDENT_AMBULATORY_CARE_PROVIDER_SITE_OTHER): Payer: BC Managed Care – PPO | Admitting: Licensed Clinical Social Worker

## 2018-07-18 ENCOUNTER — Other Ambulatory Visit (HOSPITAL_COMMUNITY): Payer: BC Managed Care – PPO

## 2018-07-18 DIAGNOSIS — F410 Panic disorder [episodic paroxysmal anxiety] without agoraphobia: Secondary | ICD-10-CM

## 2018-07-18 DIAGNOSIS — F4310 Post-traumatic stress disorder, unspecified: Secondary | ICD-10-CM

## 2018-07-18 DIAGNOSIS — F411 Generalized anxiety disorder: Secondary | ICD-10-CM

## 2018-07-18 NOTE — Progress Notes (Signed)
   THERAPIST PROGRESS NOTE  Session Time: 2:30pm-3:25pm  Participation Level: Active  Behavioral Response: Well GroomedAlertAnxious  Type of Therapy: Individual Therapy  Treatment Goals addressed: "to get better control over my anxiety"  Interventions: Motivational Interviewing and Other: Grounding and Mindfulness techniques  Summary: Dylan Guerrero is a 42 y.o. male who presents with Generalized Anxiety Disorder, PTSD, and Panic Disorder  Suicidal/Homicidal: No without intent/plan  Therapist Response:  Dylan Guerrero met with clinician for individual therapy. Deunta discussed his psychiatric symptoms and current life events. Ramil shared that he has been doing much better since he completed PHP and IOP. He reports improvement in daily routine and activity level. He also identified that medication has been helpful in managing anxiety. 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 However, he does not feel that going back to work is in the near future. Clinician engaged in rapport building and updated treatment plan. Clinician provided "5 Senses" card and role played this grounding technique.   Plan: Return again in 2-3 weeks.  Diagnosis:     Axis I:  Generalized Anxiety Disorder, PTSD, and Panic Disorder   [name], LCSW 07/18/2018

## 2018-07-19 ENCOUNTER — Other Ambulatory Visit (HOSPITAL_COMMUNITY): Payer: BC Managed Care – PPO

## 2018-07-20 ENCOUNTER — Other Ambulatory Visit (HOSPITAL_COMMUNITY): Payer: BC Managed Care – PPO

## 2018-07-21 ENCOUNTER — Other Ambulatory Visit (HOSPITAL_COMMUNITY): Payer: BC Managed Care – PPO

## 2018-07-22 ENCOUNTER — Other Ambulatory Visit (HOSPITAL_COMMUNITY): Payer: BC Managed Care – PPO

## 2018-07-23 ENCOUNTER — Other Ambulatory Visit: Payer: Self-pay | Admitting: Internal Medicine

## 2018-07-25 ENCOUNTER — Other Ambulatory Visit (HOSPITAL_COMMUNITY): Payer: BC Managed Care – PPO

## 2018-07-26 ENCOUNTER — Other Ambulatory Visit (HOSPITAL_COMMUNITY): Payer: BC Managed Care – PPO

## 2018-07-27 ENCOUNTER — Other Ambulatory Visit (HOSPITAL_COMMUNITY): Payer: BC Managed Care – PPO

## 2018-07-28 ENCOUNTER — Other Ambulatory Visit (HOSPITAL_COMMUNITY): Payer: BC Managed Care – PPO

## 2018-07-29 ENCOUNTER — Other Ambulatory Visit (HOSPITAL_COMMUNITY): Payer: BC Managed Care – PPO

## 2018-08-01 ENCOUNTER — Other Ambulatory Visit (HOSPITAL_COMMUNITY): Payer: BC Managed Care – PPO

## 2018-08-02 ENCOUNTER — Other Ambulatory Visit (HOSPITAL_COMMUNITY): Payer: BC Managed Care – PPO

## 2018-08-03 ENCOUNTER — Other Ambulatory Visit (HOSPITAL_COMMUNITY): Payer: BC Managed Care – PPO

## 2018-08-04 ENCOUNTER — Other Ambulatory Visit (HOSPITAL_COMMUNITY): Payer: BC Managed Care – PPO

## 2018-08-05 ENCOUNTER — Other Ambulatory Visit (HOSPITAL_COMMUNITY): Payer: BC Managed Care – PPO

## 2018-08-10 ENCOUNTER — Encounter (HOSPITAL_COMMUNITY): Payer: Self-pay | Admitting: Licensed Clinical Social Worker

## 2018-08-10 ENCOUNTER — Ambulatory Visit (INDEPENDENT_AMBULATORY_CARE_PROVIDER_SITE_OTHER): Payer: BC Managed Care – PPO | Admitting: Licensed Clinical Social Worker

## 2018-08-10 DIAGNOSIS — F410 Panic disorder [episodic paroxysmal anxiety] without agoraphobia: Secondary | ICD-10-CM

## 2018-08-10 DIAGNOSIS — F4310 Post-traumatic stress disorder, unspecified: Secondary | ICD-10-CM

## 2018-08-10 DIAGNOSIS — F411 Generalized anxiety disorder: Secondary | ICD-10-CM

## 2018-08-10 NOTE — Progress Notes (Signed)
   THERAPIST PROGRESS NOTE  Session Time: 2:30pm-3:25pm  Participation Level: Active  Behavioral Response: NeatAlertAnxious  Type of Therapy: Individual Therapy  Treatment Goals addressed: "to get better control over my anxiety"  Interventions: Motivational Interviewing and Other: Grounding and Mindfulness techniques  Summary: Dylan Guerrero is a 42 y.o. male who presents with Generalized Anxiety Disorder, PTSD, and Panic Disorder  Suicidal/Homicidal: No without intent/plan  Therapist Response:  Dylan Guerrero met with clinician for individual therapy. Dylan Guerrero discussed his psychiatric symptoms and current life events. Dylan Guerrero shared that he has continued to make progress on his anxiety, being more able to go to the [employer] and other small stores. However, he has still not attempted to go to [place]. Clinician normalized these concerns and noted the importance of breathing, self-care, and taking small steps. Clinician led Dylan Guerrero through a gratitude meditation, focusing on an image of gratitude, attaching to the physical manifestation of gratitude, and then holding on to it once the image was gone. Clinician prescribed this meditation at least 3 times per day in order to challenge anxiety and assist with coping.     Plan: Return again in 2-3 weeks.  Diagnosis:     Axis I:  Generalized Anxiety Disorder, PTSD, and Panic Disorder   [name], LCSW 08/10/2018

## 2018-08-11 ENCOUNTER — Ambulatory Visit (INDEPENDENT_AMBULATORY_CARE_PROVIDER_SITE_OTHER): Payer: BC Managed Care – PPO | Admitting: Psychiatry

## 2018-08-11 ENCOUNTER — Encounter (HOSPITAL_COMMUNITY): Payer: Self-pay | Admitting: Psychiatry

## 2018-08-11 VITALS — BP 114/79 | HR 84 | Ht 74.0 in | Wt 255.0 lb

## 2018-08-11 DIAGNOSIS — F410 Panic disorder [episodic paroxysmal anxiety] without agoraphobia: Secondary | ICD-10-CM

## 2018-08-11 DIAGNOSIS — F411 Generalized anxiety disorder: Secondary | ICD-10-CM | POA: Diagnosis not present

## 2018-08-11 DIAGNOSIS — F4310 Post-traumatic stress disorder, unspecified: Secondary | ICD-10-CM | POA: Diagnosis not present

## 2018-08-11 MED ORDER — DULOXETINE HCL 30 MG PO CPEP: 30.0000 mg | ORAL_CAPSULE | Freq: Two times a day (BID) | ORAL | 0 refills | Status: DC

## 2018-08-11 MED ORDER — TRAZODONE HCL 50 MG PO TABS: ORAL_TABLET | ORAL | 0 refills | Status: DC

## 2018-08-11 MED ORDER — HYDROXYZINE HCL 25 MG PO TABS: 25.0000 mg | ORAL_TABLET | Freq: Every day | ORAL | 0 refills | Status: DC | PRN
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
## 2018-08-12 ENCOUNTER — Other Ambulatory Visit (HOSPITAL_COMMUNITY): Payer: Self-pay | Admitting: Family

## 2018-08-24 ENCOUNTER — Ambulatory Visit (HOSPITAL_COMMUNITY): Payer: BC Managed Care – PPO | Admitting: Licensed Clinical Social Worker

## 2018-09-07 ENCOUNTER — Other Ambulatory Visit (HOSPITAL_COMMUNITY): Payer: Self-pay

## 2018-09-07 DIAGNOSIS — F4310 Post-traumatic stress disorder, unspecified: Secondary | ICD-10-CM

## 2018-09-07 MED ORDER — TRAZODONE HCL 50 MG PO TABS: ORAL_TABLET | ORAL | 0 refills | Status: DC

## 2018-09-12 ENCOUNTER — Other Ambulatory Visit (HOSPITAL_COMMUNITY): Payer: Self-pay | Admitting: Family

## 2018-09-14 ENCOUNTER — Ambulatory Visit (INDEPENDENT_AMBULATORY_CARE_PROVIDER_SITE_OTHER): Payer: BC Managed Care – PPO | Admitting: Licensed Clinical Social Worker

## 2018-09-14 ENCOUNTER — Encounter (HOSPITAL_COMMUNITY): Payer: Self-pay | Admitting: Licensed Clinical Social Worker

## 2018-09-14 DIAGNOSIS — F410 Panic disorder [episodic paroxysmal anxiety] without agoraphobia: Secondary | ICD-10-CM | POA: Diagnosis not present

## 2018-09-14 DIAGNOSIS — F411 Generalized anxiety disorder: Secondary | ICD-10-CM

## 2018-09-14 NOTE — Progress Notes (Signed)
   THERAPIST PROGRESS NOTE  Session Time: 1:30pm-2:15pm  Participation Level: Active  Behavioral Response: Well GroomedAlertEuthymic   Type of Therapy: Individual Therapy  Treatment Goals addressed: "to get better control over my anxiety"  Interventions: Motivational Interviewing and Other: Grounding and Mindfulness techniques  Summary: Dylan Guerrero is a 42 y.o. male who presents with Generalized Anxiety Disorder, PTSD, and Panic Disorder  Suicidal/Homicidal: No without intent/plan  Therapist Response:  Dylan Guerrero met with clinician for individual therapy. Dylan Guerrero discussed his psychiatric symptoms and current life events. Vence shared that he has been doing better with controlling his anxiety. However, he is currently experiencing back pain and going to massage therapy and chiropractor. Clinician explored the connection with pain and mood and noted increased moodiness with increased pain. Dylan Guerrero also identified problems sleeping. Clinician discussed sleep hygiene and provided handout with recommendations for healthy sleep. Clinician also encouraged Dylan Guerrero to take Trazodone earlier in the evening in order to not feel so drowsy in the mornings.   Plan: Return again in 4 weeks.  Diagnosis:     Axis I:  Generalized Anxiety Disorder, PTSD, and Panic Disorder  [name], LCSW 09/14/2018

## 2018-10-11 ENCOUNTER — Ambulatory Visit (HOSPITAL_COMMUNITY): Payer: BC Managed Care – PPO | Admitting: Psychiatry

## 2018-10-13 ENCOUNTER — Ambulatory Visit (HOSPITAL_COMMUNITY): Payer: BC Managed Care – PPO | Admitting: Licensed Clinical Social Worker

## 2018-11-15 ENCOUNTER — Encounter (HOSPITAL_COMMUNITY): Payer: Self-pay | Admitting: Licensed Clinical Social Worker

## 2018-11-15 ENCOUNTER — Ambulatory Visit (INDEPENDENT_AMBULATORY_CARE_PROVIDER_SITE_OTHER): Payer: BC Managed Care – PPO | Admitting: Licensed Clinical Social Worker

## 2018-11-15 DIAGNOSIS — F411 Generalized anxiety disorder: Secondary | ICD-10-CM | POA: Diagnosis not present

## 2018-11-15 DIAGNOSIS — F4310 Post-traumatic stress disorder, unspecified: Secondary | ICD-10-CM

## 2018-11-15 NOTE — Progress Notes (Signed)
   THERAPIST PROGRESS NOTE  Session Time: 10:00am-11:00am  Participation Level: Active  Behavioral Response: NeatAlertEuthymic  Type of Therapy: Individual Therapy  Treatment Goals addressed: "to get better control over my anxiety"  Interventions: Motivational Interviewing and Other: Grounding and Mindfulness techniques  Summary: Dylan Guerrero is a 43 y.o. male who presents with Generalized Anxiety Disorder, PTSD, and Panic Disorder  Suicidal/Homicidal: No without intent/plan  Therapist Response:  [name] met with clinician for individual therapy. [name] discussed his psychiatric symptoms and current life events. [name] shared that he has been travelling every weekend to [place] to care for his father who has COPD. He reports this is stressful and has been thinking about moving there. Clinician discussed the effects of caregiver fatigue and identified sxs present in [name] at this time. Clinician explored alternatives for [name], including support services offered by the [place]. [name] reports some improvement in being able to get out and about on his own. He reports going shopping for himself more often over the past month. He also identified increased driving. Clinician discussed the role of anxiety and PTSD in his daily life and noted medication has been helpful. [name] identified a lot of ongoing pain in his lower back and neck. However, he reports there is nothing that can be done surgically at this time. Clinician processed ways to reduce racing thoughts and identified options for coping skills, such as singing, watching a funny tv show, taking a walk, or calling someone.    Plan: Return again in 2-3 weeks.  Diagnosis:     Axis I:  Generalized Anxiety Disorder, PTSD, and Panic Disorder    [name], LCSW 11/15/2018

## 2018-11-16 ENCOUNTER — Other Ambulatory Visit (HOSPITAL_COMMUNITY): Payer: Self-pay

## 2018-11-16 DIAGNOSIS — F411 Generalized anxiety disorder: Secondary | ICD-10-CM

## 2018-11-16 MED ORDER — HYDROXYZINE HCL 25 MG PO TABS
25.0000 mg | ORAL_TABLET | Freq: Every day | ORAL | 0 refills | Status: DC | PRN
Start: 2018-11-16 — End: 2018-12-02

## 2018-11-23 ENCOUNTER — Ambulatory Visit (HOSPITAL_COMMUNITY): Payer: BC Managed Care – PPO | Admitting: Psychiatry

## 2018-12-02 ENCOUNTER — Encounter (HOSPITAL_COMMUNITY): Payer: Self-pay | Admitting: Psychiatry

## 2018-12-02 ENCOUNTER — Ambulatory Visit (INDEPENDENT_AMBULATORY_CARE_PROVIDER_SITE_OTHER): Payer: BC Managed Care – PPO | Admitting: Psychiatry

## 2018-12-02 DIAGNOSIS — F410 Panic disorder [episodic paroxysmal anxiety] without agoraphobia: Secondary | ICD-10-CM

## 2018-12-02 DIAGNOSIS — F4310 Post-traumatic stress disorder, unspecified: Secondary | ICD-10-CM | POA: Diagnosis not present

## 2018-12-02 DIAGNOSIS — F411 Generalized anxiety disorder: Secondary | ICD-10-CM | POA: Diagnosis not present

## 2018-12-02 MED ORDER — HYDROXYZINE HCL 25 MG PO TABS: 25.0000 mg | ORAL_TABLET | Freq: Two times a day (BID) | ORAL | 2 refills | Status: DC | PRN

## 2018-12-02 MED ORDER — DULOXETINE HCL 60 MG PO CPEP
60.0000 mg | ORAL_CAPSULE | Freq: Every day | ORAL | 0 refills | Status: DC
Start: 2018-12-02 — End: 2019-03-02
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
## 2018-12-04 ENCOUNTER — Encounter: Payer: Self-pay | Admitting: Diagnostic Neuroimaging

## 2018-12-06 ENCOUNTER — Ambulatory Visit: Payer: BC Managed Care – PPO | Admitting: Adult Health

## 2018-12-07 ENCOUNTER — Ambulatory Visit: Payer: BC Managed Care – PPO | Admitting: Neurology

## 2018-12-07 ENCOUNTER — Encounter: Payer: Self-pay | Admitting: Neurology

## 2018-12-07 ENCOUNTER — Other Ambulatory Visit: Payer: Self-pay

## 2018-12-07 VITALS — BP 139/90 | HR 79 | Ht 74.0 in | Wt 271.0 lb

## 2018-12-07 DIAGNOSIS — G4733 Obstructive sleep apnea (adult) (pediatric): Secondary | ICD-10-CM

## 2018-12-07 DIAGNOSIS — Z9989 Dependence on other enabling machines and devices: Secondary | ICD-10-CM

## 2018-12-07 NOTE — Patient Instructions (Addendum)

## 2018-12-07 NOTE — Progress Notes (Signed)
Subjective:    Patient ID: Dylan Guerrero is a 43 y.o. male.  HPI     Interim history:  Dylan Guerrero is a 43 year old right-handed gentleman with an underlying medical history of recurrent headaches, hypertension, allergies, arthritis, history of sinus infection, reflux disease, and obesity, who presents for follow-up consultation of his obstructive sleep apnea, on treatment with CPAP. The patient is unaccompanied today and presents for his yearly checkup. I last saw him on 11/30/2017, at which time he was compliant with CPAP therapy and reported good results including good sleep quality, good sleep consolidation and less daytime somnolence.  Today, 12/07/2018: I reviewed his CPAP compliance data from 11/05/2018 through 12/04/2018 which is a total of 30 days, during which time he used his machine 29 days with percent used days greater than 4 hours at 83%, indicating very good compliance with an average usage of 6 hours and 55 minutes, residual AHI at goal at 0.4 per hour, leak on the higher and with the 95th percentile at 21.6 L/m on a pressure of 11 cm with EPR of 3. He reports doing well with CPAP. Is typically up to date with supplies, rarely he may take the mask off in the middle of the night and not realize it. Had neck and lumbar ESI yesterday, under Dr. Nelva Bush, using a cane currently. Has had neck pain, migraines okay. Has been on Cymbalta per psychiatry and no longer on gabapentin since August. Has LBP. Would like an Rx for travel CPAP.    Previously:  I first met him on 03/17/2016 at the request of Dr. Leta Baptist, at which time the patient reported snoring and a recent abnormal pulse oximetry test. I suggested we proceed with sleep study testing. The patient had a baseline sleep study, followed by a CPAP titration study. Baseline sleep study from 04/23/2016 showed a sleep efficiency of only 49.9%, he had a prolonged sleep latency and significant wake after sleep onset. He had a reduced REM  percentage, delay in REM sleep as well. Total AHI was 90.4 per hour. Average oxygen saturation was 90%, nadir was 76%. Based on his test results I suggested we proceed with a full night CPAP titration study. He had this on 05/19/2016. Sleep efficiency was 69.2%, sleep latency 47.5 minutes and REM latency 132.5 minutes. CPAP was titrated from 5 cm to 11 cm. On the final pressure his AHI was 0 per hour, average oxygen saturation was 91%, O2 nadir was 90% on the final pressure. Based on his test results I prescribed CPAP therapy for home use at a pressure of 11 cm.   I reviewed his CPAP compliance data from 10/30/2017 through 11/28/2017 which is a total of 30 days, during which time he used his CPAP every night with percent used days greater than 4 hours at 100%, indicating superb compliance with an average usage of 8 hours and 23 minutes, residual AHI 0.6 per hour, leak on the higher side with the 95th percentile at 28.1 liters per minute on a pressure of 11 cm with EPR of 3.    03/17/2016: (He) reports possible snoring and an abnormal overnight pulse oximetry test recently. I reviewed your office note from 01/03/2016.  He denies morning headaches, has nocturia about once per night on average, does not always wake up rested, feels either adequately to marginally rested typically. Tries to go to bed by 8:30 and puts his TV on the 90 minute timer, typically is asleep about 30-45 minutes into it. Wakeup time  is 4:30 AM. His weight has been fluctuating. Epworth sleepiness score is 1 out of 24 today, his fatigue score is 21 out of 63. Primary care physician ordered an overnight pulse oximetry test recently in April 2017. Results are not available for my review today but patient verbally reports that findings were mildly abnormal and PCP talked to him about potentially doing a sleep study, and the diagnosis of OSA and treatment options including CPAP at the time.   The patient is single and works as a Recruitment consultant for  [employer]. He is a nonsmoker. He lives alone. He does not drink caffeine on a regular basis (maybe one cup per week) or alcohol on a regular basis, in fact quit alcohol in 2011, never heavy drinker. Has no pets, no children, no FHx of OSA. No RLS Sx.  His Past Medical History Is Significant For: Past Medical History:  Diagnosis Date  . Abdominal pain   . Allergy   . Anxiety   . Arthritis   . Carpal tunnel syndrome   . Depression   . GERD (gastroesophageal reflux disease)   . Headache(784.0)   . Hernia   . Hypertension   . Hypokalemia   . Infection of the inner ear   . Migraine   . Sinus infection   . Sleep apnea   . Spider bite     His Past Surgical History Is Significant For: Past Surgical History:  Procedure Laterality Date  . APPENDECTOMY  1984  . HERNIA REPAIR  01/30/2011   Right Inguinal Hernia repair with mesh  . HERNIA REPAIR  60/73/7106   Umbilical Hernia repair with meash    His Family History Is Significant For: Family History  Problem Relation Age of Onset  . Scleroderma Mother   . Depression Father   . Anxiety disorder Father   . Diabetes Unknown   . Hypertension Unknown   . Dementia Paternal Grandfather   . ADD / ADHD Other     His Social History Is Significant For: Social History   Socioeconomic History  . Marital status: Single    Spouse name: Not on file  . Number of children: 0  . Years of education: college  . Highest education level: Not on file  Occupational History  . Occupation: BUS DRIVER    Employer: [employer]  Social Needs  . Financial resource strain: Somewhat hard  . Food insecurity:    Worry: Sometimes true    Inability: Sometimes true  . Transportation needs:    Medical: Yes    Non-medical: Yes  Tobacco Use  . Smoking status: Never Smoker  . Smokeless tobacco: Never Used  Substance and Sexual Activity  . Alcohol use: No    Alcohol/week: 0.0 standard drinks    Comment: quit 10/05/2009  .  Drug use: No  . Sexual activity: Not Currently  Lifestyle  . Physical activity:    Days per week: 1 day    Minutes per session: 30 min  . Stress: Very much  Relationships  . Social connections:    Talks on phone: More than three times a week    Gets together: Once a week    Attends religious service: More than 4 times per year    Active member of club or organization: No    Attends meetings of clubs or organizations: Never    Relationship status: Never married  Other Topics Concern  . Not on file  Social History Narrative  Pt lives at home alone.   Caffeine Use- maybe 1 cup of coffee a week    Patient has some financial restraints and transportation issues that have at times affected his ability to get medications and to meet basic needs adequately.     His Allergies Are:  Allergies  Allergen Reactions  . Ibuprofen Other (See Comments)    Acid reflux  . Ketoprofen Other (See Comments)    Muscle and stomach cramps   . Topiramate Other (See Comments)    Abdominal cramps and constipation   . Adhesive [Tape] Rash    Medical tape  :   His Current Medications Are:  Outpatient Encounter Medications as of 12/07/2018  Medication Sig  . albuterol (PROVENTIL HFA;VENTOLIN HFA) 108 (90 Base) MCG/ACT inhaler Inhale 2 puffs into the lungs every 6 (six) hours as needed for wheezing or shortness of breath.  Marland Kitchen amLODipine (NORVASC) 10 MG tablet Take 10 mg by mouth daily.  . bisoprolol (ZEBETA) 5 MG tablet TAKE 1 TABLET BY MOUTH EVERY DAY  . DULoxetine (CYMBALTA) 60 MG capsule Take 1 capsule (60 mg total) by mouth daily for 30 days.  . fluticasone (FLONASE) 50 MCG/ACT nasal spray Place 1 spray into both nostrils daily as needed for allergies or rhinitis.   . Glycerin-Hypromellose-PEG 400 (ARTIFICIAL TEARS) 0.2-0.2-1 % SOLN Place 1-2 drops into both eyes 2 (two) times daily.  . hydrochlorothiazide (HYDRODIURIL) 25 MG tablet Take 25 mg by mouth daily.  . hydrocortisone cream 1 % Apply 1  application topically 2 (two) times daily as needed for itching (RASH).  . hydrOXYzine (ATARAX/VISTARIL) 25 MG tablet Take 1 tablet (25 mg total) by mouth 2 (two) times daily as needed for anxiety.  Marland Kitchen linaclotide (LINZESS) 145 MCG CAPS capsule Take 145 mcg by mouth daily.  Marland Kitchen loratadine (CLARITIN) 10 MG tablet Take 10 mg by mouth daily.  . meclizine (ANTIVERT) 25 MG tablet Take 25 mg by mouth 2 (two) times daily.   Marland Kitchen omeprazole (PRILOSEC) 40 MG capsule TAKE 1 CAPSULE BY MOUTH EVERY DAY (Patient taking differently: Take 40 mg by mouth daily. )  . traZODone (DESYREL) 50 MG tablet Take 1/2 to one tab as needed for insomnia  . triamcinolone cream (KENALOG) 0.1 % Apply 1 application topically as needed (for rashes).   Marland Kitchen UNABLE TO FIND CPAP at bedtime   No facility-administered encounter medications on file as of 12/07/2018.   :  Review of Systems:  Out of a complete 14 point review of systems, all are reviewed and negative with the exception of these symptoms as listed below: Review of Systems  Neurological:       Pt presents today to discuss his cpap. Pt reports that his cpap is going well.    Objective:  Neurological Exam  Physical Exam Physical Examination:   Vitals:   12/07/18 1135  BP: 139/90  Pulse: 79    General Examination: The patient is a very pleasant 43 y.o. male in no acute distress. He appears well-developed and well-nourished and well groomed.   HEENT:Normocephalic, atraumatic, pupils are equal, round and reactive to light, extraocular tracking is good without limitation to gaze excursion or nystagmus noted. Normal smooth pursuit is noted. Hearing is grossly intact. Face is symmetric with normal facial animation, speech is clear with no dysarthria noted. There is no hypophonia. There is no lip, neck/head, jaw or voice tremor. Neck shows mild decrease in neck mobility. Oropharynx exam reveals: mild mouth dryness, adequate to marginal dental hygiene and  moderate airway  crowding. He has braces in place. Tongue protrudes centrally and palate elevates symmetrically. Tonsils are 1-2+ bilaterally.   Chest:Clear to auscultation without wheezing, rhonchi or crackles noted.  Heart:S1+S2+0, regular and normal without murmurs, rubs or gallops noted.   Abdomen:Soft, non-tender and non-distended with normal bowel sounds appreciated on auscultation.  Extremities:There is no pitting edema in the distal lower extremities bilaterally.   Skin: Warm and dry without trophic changes noted.   Musculoskeletal: exam reveals no obvious joint deformities, tenderness or joint swelling or erythema. Brought a cane, reports LBP.   Neurologically:  Mental status: The patient is awake, alert and oriented in all 4 spheres. His immediate and remote memory, attention, language skills and fund of knowledge are appropriate. There is no evidence of aphasia, agnosia, apraxia or anomia. Speech is clear with normal prosody and enunciation. Thought process is linear. Mood is normaland affect is normal.  Cranial nerves II - XII are as described above under HEENT exam. Motor exam: Normal bulk, strength and tone is noted. There is no drift, tremor. Romberg is negative. Fine motor skills and coordination: grossly intact.  Cerebellar testing: No dysmetria or intention tremor. There is no truncal or gait ataxia.  Sensory exam: intact to light touch in the upper and lower extremities.  Gait, station and balance: He stands easily. No veering to one side is noted. No leaning to one side is noted. Posture is age-appropriate and stance is narrow based.  Is able to walk slowly without his cane. He walks cautiously.  Assessment and Plan:  In summary, SYLIS KETCHUM a very pleasant 43 year old male with an underlying medical history of recurrent headaches, hypertension, allergies, arthritis, history of sinus infection, reflux disease, and obesity, who presents for follow-up consultation of his  obstructive sleep apnea which was determined to be severe during his baseline sleep study on 04/23/2016. He had a subsequent CPAP titration study on 05/19/2016. He has been on CPAP therapy. He indicates full compliance and very good results, he has been up-to-date with his supplies. His CPAP compliance is very good. He is commended for his ongoing good treatment adherence. He has gained a little bit of weight and is encouraged to work on weight loss. He also requested a travel CPAP prescription which I provided today. I would like for him to follow-up routinely in one year, he can see one of our nurse practitioners next time. He is up-to-date with his supplies through his DME company. I answered all his questions today and he was in agreement. I spent 20 minutes in total face-to-face time with the patient, more than 50% of which was spent in counseling and coordination of care, reviewing test results, reviewing medication and discussing or reviewing the diagnosis of OSA, its prognosis and treatment options. Pertinent laboratory and imaging test results that were available during this visit with the patient were reviewed by me and considered in my medical decision making (see chart for details).

## 2018-12-13 ENCOUNTER — Ambulatory Visit: Payer: BC Managed Care – PPO | Admitting: Diagnostic Neuroimaging

## 2018-12-13 ENCOUNTER — Encounter: Payer: Self-pay | Admitting: Diagnostic Neuroimaging

## 2018-12-13 ENCOUNTER — Other Ambulatory Visit (HOSPITAL_COMMUNITY): Payer: Self-pay

## 2018-12-13 VITALS — BP 104/76 | HR 68 | Ht 74.0 in | Wt 269.6 lb

## 2018-12-13 DIAGNOSIS — R42 Dizziness and giddiness: Secondary | ICD-10-CM | POA: Diagnosis not present

## 2018-12-13 DIAGNOSIS — G43109 Migraine with aura, not intractable, without status migrainosus: Secondary | ICD-10-CM

## 2018-12-13 DIAGNOSIS — F4310 Post-traumatic stress disorder, unspecified: Secondary | ICD-10-CM

## 2018-12-13 MED ORDER — TRAZODONE HCL 50 MG PO TABS: ORAL_TABLET | ORAL | 0 refills | Status: DC
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
## 2018-12-14 ENCOUNTER — Ambulatory Visit (HOSPITAL_COMMUNITY): Payer: BC Managed Care – PPO | Admitting: Licensed Clinical Social Worker

## 2018-12-14 ENCOUNTER — Encounter (HOSPITAL_COMMUNITY): Payer: Self-pay | Admitting: Licensed Clinical Social Worker

## 2018-12-14 ENCOUNTER — Other Ambulatory Visit: Payer: Self-pay

## 2018-12-14 DIAGNOSIS — F411 Generalized anxiety disorder: Secondary | ICD-10-CM

## 2018-12-14 DIAGNOSIS — F4310 Post-traumatic stress disorder, unspecified: Secondary | ICD-10-CM

## 2018-12-14 DIAGNOSIS — F410 Panic disorder [episodic paroxysmal anxiety] without agoraphobia: Secondary | ICD-10-CM | POA: Diagnosis not present

## 2018-12-14 NOTE — Progress Notes (Signed)
   THERAPIST PROGRESS NOTE  Session Time: 10:00am-10:45am  Participation Level: Active  Behavioral Response: Well GroomedAlertEuthymic  Type of Therapy: Individual Therapy  Treatment Goals addressed: "to get better control over my anxiety"  Interventions: Motivational Interviewing and Other: Grounding and Mindfulness techniques  Summary: Rontrell Moquin is a 43 y.o. male who presents with Generalized Anxiety Disorder, PTSD, and Panic Disorder  Suicidal/Homicidal: No without intent/plan  Therapist Response:  [name] met with clinician for individual therapy. Quantel discussed his psychiatric symptoms and current life events. Ronell shared that he has been doing okay. Some panic/anxiety sxs related to travelling the other week for a family wedding. However, Philmore reported use of coping skills to manage anxiety before it got out of control. Clinician validated choices and provided other feedback on options of using cold water to regulate body temp and sensation of grounding. Clinician discussed preparing for anxiety provoking situations. Akoni reports he tries to keep activities on hand to help him relax when he is out in public.  Clinician explored options for daily activities and provided contact information for [place]. Lannie agreed to reach out and schedule a visit, as he is really trying to get out of the house on a daily basis. Darrik continues to care for his father, travelling back and forth on weekends. He identifies that medication is helpful in mood stabilization, although break through panic still occurs. Clinician also discussed possible options for part time work, but Blu reports he is not ready for that yet.    Plan: Return again in 3-4 weeks.  Diagnosis:     Axis I:  Generalized Anxiety Disorder, PTSD, and Panic Disorder   [name], LCSW 12/14/2018

## 2019-01-12 ENCOUNTER — Ambulatory Visit (HOSPITAL_COMMUNITY): Payer: BC Managed Care – PPO | Admitting: Licensed Clinical Social Worker

## 2019-01-12 ENCOUNTER — Other Ambulatory Visit: Payer: Self-pay

## 2019-03-02 ENCOUNTER — Other Ambulatory Visit: Payer: Self-pay

## 2019-03-02 ENCOUNTER — Encounter (HOSPITAL_COMMUNITY): Payer: Self-pay | Admitting: Psychiatry

## 2019-03-02 ENCOUNTER — Ambulatory Visit (INDEPENDENT_AMBULATORY_CARE_PROVIDER_SITE_OTHER): Payer: BC Managed Care – PPO | Admitting: Psychiatry

## 2019-03-02 DIAGNOSIS — F4310 Post-traumatic stress disorder, unspecified: Secondary | ICD-10-CM

## 2019-03-02 DIAGNOSIS — F411 Generalized anxiety disorder: Secondary | ICD-10-CM | POA: Diagnosis not present

## 2019-03-02 DIAGNOSIS — F410 Panic disorder [episodic paroxysmal anxiety] without agoraphobia: Secondary | ICD-10-CM | POA: Diagnosis not present

## 2019-03-02 MED ORDER — HYDROXYZINE HCL 25 MG PO TABS: 25.0000 mg | ORAL_TABLET | Freq: Two times a day (BID) | ORAL | 2 refills | Status: DC | PRN

## 2019-03-02 MED ORDER — DULOXETINE HCL 60 MG PO CPEP: 60.0000 mg | ORAL_CAPSULE | Freq: Every day | ORAL | 0 refills | Status: DC
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
## 2019-05-29 ENCOUNTER — Encounter (HOSPITAL_COMMUNITY): Payer: Self-pay | Admitting: Psychiatry

## 2019-05-29 ENCOUNTER — Other Ambulatory Visit: Payer: Self-pay

## 2019-05-29 ENCOUNTER — Ambulatory Visit (INDEPENDENT_AMBULATORY_CARE_PROVIDER_SITE_OTHER): Payer: BC Managed Care – PPO | Admitting: Psychiatry

## 2019-05-29 DIAGNOSIS — F4310 Post-traumatic stress disorder, unspecified: Secondary | ICD-10-CM

## 2019-05-29 DIAGNOSIS — F410 Panic disorder [episodic paroxysmal anxiety] without agoraphobia: Secondary | ICD-10-CM

## 2019-05-29 DIAGNOSIS — F411 Generalized anxiety disorder: Secondary | ICD-10-CM

## 2019-05-29 MED ORDER — DULOXETINE HCL 30 MG PO CPEP: 30.0000 mg | ORAL_CAPSULE | Freq: Two times a day (BID) | ORAL | 0 refills | Status: DC

## 2019-05-29 MED ORDER — HYDROXYZINE HCL 25 MG PO TABS: 25.0000 mg | ORAL_TABLET | Freq: Two times a day (BID) | ORAL | 2 refills | Status: DC | PRN

## 2019-05-29 MED ORDER — TRAZODONE HCL 50 MG PO TABS: ORAL_TABLET | ORAL | 0 refills | Status: DC
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
## 2019-06-01 ENCOUNTER — Ambulatory Visit (HOSPITAL_COMMUNITY): Payer: BC Managed Care – PPO | Admitting: Psychiatry

## 2019-06-01 ENCOUNTER — Encounter (HOSPITAL_COMMUNITY): Payer: Self-pay | Admitting: Licensed Clinical Social Worker

## 2019-06-01 ENCOUNTER — Ambulatory Visit (INDEPENDENT_AMBULATORY_CARE_PROVIDER_SITE_OTHER): Payer: BC Managed Care – PPO | Admitting: Licensed Clinical Social Worker

## 2019-06-01 ENCOUNTER — Other Ambulatory Visit: Payer: Self-pay

## 2019-06-01 DIAGNOSIS — F4310 Post-traumatic stress disorder, unspecified: Secondary | ICD-10-CM

## 2019-06-01 DIAGNOSIS — F411 Generalized anxiety disorder: Secondary | ICD-10-CM

## 2019-06-01 DIAGNOSIS — F410 Panic disorder [episodic paroxysmal anxiety] without agoraphobia: Secondary | ICD-10-CM | POA: Diagnosis not present

## 2019-06-01 NOTE — Progress Notes (Signed)
Virtual Visit via Telephone Note  I connected with Dylan Guerrero on 06/01/19 at 12:30 PM EDT by telephone and verified that I am speaking with the correct person using two identifiers.     I discussed the limitations, risks, security and privacy concerns of performing an evaluation and management service by telephone and the availability of in person appointments. I also discussed with the patient that there may be a patient responsible charge related to this service. The patient expressed understanding and agreed to proceed.  Type of Therapy: Individual Therapy  Treatment Goals addressed: "to get better control over my anxiety"  Interventions: Motivational Interviewing and Other: Grounding and Mindfulness techniques  Summary: Dylan Guerrero is a 43 y.o. male who presents with Generalized Anxiety Disorder, PTSD, and Panic Disorder  Suicidal/Homicidal: No without intent/plan  Therapist Response:  [name] met with clinician for individual therapy. Ashely discussed his psychiatric symptoms and current life events. [name] shared that he has been feeling overwhelmed lately with a lot of changes and challenges. He reports upcoming plan to move to [facility] to care for his father full time. Clinician explored how the summer has been with staying there for the past several months. He reported concerns about family members, but noted that once he is there full time, he will be asserting himself and setting limits. Clinician processed this and identified potential gains and challenges in setting these limits. However, clinician identified the importance of taking over control, as he will be the primary caregiver. Clinician explored self care and provided feedback about caregiver fatigue. Clinician encouraged [name] to set a plan for him to have a few days and at least one weekend off per 6-8 weeks so he does not burn out. [name] identified some panic sxs a few weeks ago, mainly due to feeling overwhelmed. He  reported decrease in driving. Clinician provided supportive feedback and encouraged him to continue driving daily to get more comfortable and confident behind the wheel, as driving is a necessity where he is moving.   Plan: Return again in 6-8 weeks.  Diagnosis: Axis I: Generalized Anxiety Disorder, PTSD, and Panic Disorder    I discussed the assessment and treatment plan with the patient. The patient was provided an opportunity to ask questions and all were answered. The patient agreed with the plan and demonstrated an understanding of the instructions.   The patient was advised to call back or seek an in-person evaluation if the symptoms worsen or if the condition fails to improve as anticipated.  I provided 45 minutes of non-face-to-face time during this encounter.   Dylan Curling, LCSW

## 2019-07-10 ENCOUNTER — Ambulatory Visit (INDEPENDENT_AMBULATORY_CARE_PROVIDER_SITE_OTHER): Payer: BC Managed Care – PPO | Admitting: Licensed Clinical Social Worker

## 2019-07-10 ENCOUNTER — Other Ambulatory Visit: Payer: Self-pay

## 2019-07-10 ENCOUNTER — Encounter (HOSPITAL_COMMUNITY): Payer: Self-pay | Admitting: Licensed Clinical Social Worker

## 2019-07-10 DIAGNOSIS — F411 Generalized anxiety disorder: Secondary | ICD-10-CM

## 2019-07-10 NOTE — Progress Notes (Signed)
Virtual Visit via Telephone Note  I connected with Dylan Guerrero on 07/10/19 at  1:30 PM EDT by telephone and verified that I am speaking with the correct person using two identifiers.     I discussed the limitations, risks, security and privacy concerns of performing an evaluation and management service by telephone and the availability of in person appointments. I also discussed with the patient that there may be a patient responsible charge related to this service. The patient expressed understanding and agreed to proceed.   Type of Therapy: Individual Therapy  Treatment Goals addressed: "to get better control over my anxiety"  Interventions: Motivational Interviewing and Other: Grounding and Mindfulness techniques  Summary: Dylan Guerrero is a 43 y.o. male who presents with Generalized Anxiety Disorder  Suicidal/Homicidal: No without intent/plan  Therapist Response:  Dylan Guerrero met with clinician for individual therapy. Dylan Guerrero discussed his psychiatric symptoms and current life events. Jean shared that he most of the way packed up and [place] He reports he has not been experiencing any panic attacks or PTSD sxs. He identified some adjustment type anxiety due to the move, transition, and to managing some expectations of his family members since he will now be taking on the primary caregiving role for his father. Clinician explored need for counseling and Dylan Guerrero identified that things are going well and he has no need to schedule another appointment at this time. He will contact the writer in the future as needed. He will continue to see Dr. Adele Schilder for medication management.   Plan: Return again as needed. No future appointment was scheduled, but he will contact if future appointments are needed.   Diagnosis: Axis I: Generalized Anxiety Disorder   I discussed the assessment and treatment plan with the patient. The patient was provided an opportunity to ask  questions and all were answered. The patient agreed with the plan and demonstrated an understanding of the instructions.   The patient was advised to call back or seek an in-person evaluation if the symptoms worsen or if the condition fails to improve as anticipated.  I provided 20 minutes of non-face-to-face time during this encounter.   [name], LCSW

## 2019-08-21 ENCOUNTER — Other Ambulatory Visit: Payer: Self-pay

## 2019-08-21 ENCOUNTER — Ambulatory Visit (HOSPITAL_COMMUNITY): Payer: BC Managed Care – PPO | Admitting: Psychiatry

## 2019-08-28 ENCOUNTER — Telehealth (HOSPITAL_COMMUNITY): Payer: Self-pay

## 2019-08-28 DIAGNOSIS — F4310 Post-traumatic stress disorder, unspecified: Secondary | ICD-10-CM

## 2019-08-28 DIAGNOSIS — F411 Generalized anxiety disorder: Secondary | ICD-10-CM

## 2019-08-28 DIAGNOSIS — F410 Panic disorder [episodic paroxysmal anxiety] without agoraphobia: Secondary | ICD-10-CM

## 2019-08-28 MED ORDER — HYDROXYZINE HCL 25 MG PO TABS: 25.0000 mg | ORAL_TABLET | Freq: Two times a day (BID) | ORAL | 0 refills | Status: DC | PRN

## 2019-08-28 MED ORDER — DULOXETINE HCL 30 MG PO CPEP: 30.0000 mg | ORAL_CAPSULE | Freq: Two times a day (BID) | ORAL | 0 refills | Status: DC

## 2019-08-28 NOTE — Telephone Encounter (Signed)
Medication refill requests - Faxes received from pt's [facility] for refills of his Duloxetine and Hydroxyzine. Pt no showed 08/21/19 but has rescheduled for 09/11/19. Last 90 day orders 05/29/19 for both.

## 2019-08-28 NOTE — Telephone Encounter (Signed)
30 days supply until needs to be seen.

## 2019-08-28 NOTE — Telephone Encounter (Signed)
Patient's Hydroxyzine and Duloxetine 30 day orders e-scribed to patient's [facility] per Dr. Adele Schilder order.  Patient can request 90 day if keeps appt. 09/11/19.

## 2019-09-11 ENCOUNTER — Other Ambulatory Visit: Payer: Self-pay

## 2019-09-11 ENCOUNTER — Encounter (HOSPITAL_COMMUNITY): Payer: Self-pay | Admitting: Psychiatry

## 2019-09-11 ENCOUNTER — Ambulatory Visit (INDEPENDENT_AMBULATORY_CARE_PROVIDER_SITE_OTHER): Payer: BC Managed Care – PPO | Admitting: Psychiatry

## 2019-09-11 DIAGNOSIS — F410 Panic disorder [episodic paroxysmal anxiety] without agoraphobia: Secondary | ICD-10-CM | POA: Diagnosis not present

## 2019-09-11 DIAGNOSIS — F411 Generalized anxiety disorder: Secondary | ICD-10-CM

## 2019-09-11 DIAGNOSIS — F4310 Post-traumatic stress disorder, unspecified: Secondary | ICD-10-CM | POA: Diagnosis not present

## 2019-09-11 MED ORDER — HYDROXYZINE HCL 25 MG PO TABS: 25.0000 mg | ORAL_TABLET | Freq: Three times a day (TID) | ORAL | 1 refills | Status: DC

## 2019-09-11 MED ORDER — TRAZODONE HCL 100 MG PO TABS: ORAL_TABLET | ORAL | 1 refills | Status: DC

## 2019-09-11 MED ORDER — DULOXETINE HCL 30 MG PO CPEP: 30.0000 mg | ORAL_CAPSULE | Freq: Two times a day (BID) | ORAL | 1 refills | Status: DC
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
## 2019-11-14 ENCOUNTER — Other Ambulatory Visit: Payer: Self-pay

## 2019-11-14 ENCOUNTER — Ambulatory Visit (INDEPENDENT_AMBULATORY_CARE_PROVIDER_SITE_OTHER): Payer: BC Managed Care – PPO | Admitting: Psychiatry

## 2019-11-14 ENCOUNTER — Encounter (HOSPITAL_COMMUNITY): Payer: Self-pay | Admitting: Psychiatry

## 2019-11-14 DIAGNOSIS — F410 Panic disorder [episodic paroxysmal anxiety] without agoraphobia: Secondary | ICD-10-CM

## 2019-11-14 DIAGNOSIS — F411 Generalized anxiety disorder: Secondary | ICD-10-CM | POA: Diagnosis not present

## 2019-11-14 DIAGNOSIS — F4310 Post-traumatic stress disorder, unspecified: Secondary | ICD-10-CM

## 2019-11-14 MED ORDER — HYDROXYZINE HCL 25 MG PO TABS: 25.0000 mg | ORAL_TABLET | Freq: Two times a day (BID) | ORAL | 2 refills | Status: DC | PRN

## 2019-11-14 MED ORDER — TRAZODONE HCL 100 MG PO TABS: ORAL_TABLET | ORAL | 2 refills | Status: DC

## 2019-11-14 MED ORDER — DULOXETINE HCL 30 MG PO CPEP: 30.0000 mg | ORAL_CAPSULE | Freq: Two times a day (BID) | ORAL | 2 refills | Status: DC
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
## 2019-11-30 ENCOUNTER — Ambulatory Visit (HOSPITAL_COMMUNITY): Payer: BC Managed Care – PPO | Admitting: Licensed Clinical Social Worker

## 2019-11-30 ENCOUNTER — Other Ambulatory Visit: Payer: Self-pay

## 2019-12-11 ENCOUNTER — Ambulatory Visit: Payer: BC Managed Care – PPO | Admitting: Adult Health

## 2020-01-08 ENCOUNTER — Ambulatory Visit: Payer: BC Managed Care – PPO | Admitting: Adult Health

## 2020-02-12 ENCOUNTER — Encounter (HOSPITAL_COMMUNITY): Payer: Self-pay | Admitting: Psychiatry

## 2020-02-12 ENCOUNTER — Other Ambulatory Visit: Payer: Self-pay

## 2020-02-12 ENCOUNTER — Telehealth (INDEPENDENT_AMBULATORY_CARE_PROVIDER_SITE_OTHER): Payer: BC Managed Care – PPO | Admitting: Psychiatry

## 2020-02-12 DIAGNOSIS — F410 Panic disorder [episodic paroxysmal anxiety] without agoraphobia: Secondary | ICD-10-CM | POA: Diagnosis not present

## 2020-02-12 DIAGNOSIS — F411 Generalized anxiety disorder: Secondary | ICD-10-CM

## 2020-02-12 DIAGNOSIS — F4310 Post-traumatic stress disorder, unspecified: Secondary | ICD-10-CM | POA: Diagnosis not present

## 2020-02-12 MED ORDER — DULOXETINE HCL 60 MG PO CPEP: 60.0000 mg | ORAL_CAPSULE | Freq: Every day | ORAL | 2 refills | Status: DC

## 2020-02-12 MED ORDER — HYDROXYZINE HCL 25 MG PO TABS: 25.0000 mg | ORAL_TABLET | Freq: Three times a day (TID) | ORAL | 2 refills | Status: DC

## 2020-02-12 MED ORDER — TRAZODONE HCL 100 MG PO TABS: ORAL_TABLET | ORAL | 2 refills | Status: DC
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
## 2020-03-27 ENCOUNTER — Other Ambulatory Visit: Payer: Self-pay

## 2020-03-27 ENCOUNTER — Encounter (HOSPITAL_COMMUNITY): Payer: Self-pay | Admitting: Licensed Clinical Social Worker

## 2020-03-27 ENCOUNTER — Ambulatory Visit (INDEPENDENT_AMBULATORY_CARE_PROVIDER_SITE_OTHER): Payer: BC Managed Care – PPO | Admitting: Licensed Clinical Social Worker

## 2020-03-27 DIAGNOSIS — F411 Generalized anxiety disorder: Secondary | ICD-10-CM

## 2020-03-27 NOTE — Progress Notes (Signed)
Virtual Visit via Telephone Note  I connected with Dylan Guerrero on 03/27/20 at 12:30 PM EDT by telephone and verified that I am speaking with the correct person using two identifiers.  Location: Patient: home Provider: office   I discussed the limitations, risks, security and privacy concerns of performing an evaluation and management service by telephone and the availability of in person appointments. I also discussed with the patient that there may be a patient responsible charge related to this service. The patient expressed understanding and agreed to proceed.   Type of Therapy: Individual Therapy   Treatment Goals addressed: "to get better control over my anxiety"   Interventions: CBT and Mindfulness   Summary: Dylan Guerrero is a 44 y.o. male who presents with Generalized Anxiety Disorder   Suicidal/Homicidal: No without intent/plan   Therapist Response:   [name] met with clinician for individual therapy. Major discussed his psychiatric symptoms and current life events. Dylan Guerrero shared that over the past few weeks, his anxiety has returned. Clinician utilized CBT to explore triggers, thoughts, feelings, and behaviors. Clinician identified that life is different, but he has tried to establish a life in [place] and overall he feels happy there. Clinician explored family interactions, feelings about being a full time caregiver for dad, and also balancing out a social life. Clinician explored options for assistance so Dylan Guerrero can get a break or vacation. Clinician provided psychoeducation about anxiety. Clinician led [name] through deep breathing and mindfulness meditation about gratitude. Clinician encouraged daily meditation and return to the positive coping skills he was using last year as maintenance.    Plan: Return again in 1 month.    Diagnosis: Axis I: Generalized Anxiety Disorder  I discussed the assessment and treatment plan with the patient. The patient was provided an  opportunity to ask questions and all were answered. The patient agreed with the plan and demonstrated an understanding of the instructions.   The patient was advised to call back or seek an in-person evaluation if the symptoms worsen or if the condition fails to improve as anticipated.  I provided 45 minutes of non-face-to-face time during this encounter.   [name], LCSW

## 2020-04-25 ENCOUNTER — Ambulatory Visit (HOSPITAL_COMMUNITY): Payer: BC Managed Care – PPO | Admitting: Licensed Clinical Social Worker

## 2020-04-25 ENCOUNTER — Other Ambulatory Visit: Payer: Self-pay

## 2020-05-06 ENCOUNTER — Other Ambulatory Visit (HOSPITAL_COMMUNITY): Payer: Self-pay | Admitting: Psychiatry

## 2020-05-06 DIAGNOSIS — F4310 Post-traumatic stress disorder, unspecified: Secondary | ICD-10-CM

## 2020-05-06 DIAGNOSIS — F410 Panic disorder [episodic paroxysmal anxiety] without agoraphobia: Secondary | ICD-10-CM

## 2020-05-06 DIAGNOSIS — F411 Generalized anxiety disorder: Secondary | ICD-10-CM

## 2020-05-14 ENCOUNTER — Telehealth (HOSPITAL_COMMUNITY): Payer: BC Managed Care – PPO | Admitting: Psychiatry

## 2020-05-17 ENCOUNTER — Telehealth (INDEPENDENT_AMBULATORY_CARE_PROVIDER_SITE_OTHER): Payer: BC Managed Care – PPO | Admitting: Psychiatry

## 2020-05-17 ENCOUNTER — Other Ambulatory Visit: Payer: Self-pay

## 2020-05-17 ENCOUNTER — Encounter (HOSPITAL_COMMUNITY): Payer: Self-pay | Admitting: Psychiatry

## 2020-05-17 VITALS — Wt 270.0 lb

## 2020-05-17 DIAGNOSIS — F4310 Post-traumatic stress disorder, unspecified: Secondary | ICD-10-CM

## 2020-05-17 DIAGNOSIS — F411 Generalized anxiety disorder: Secondary | ICD-10-CM | POA: Diagnosis not present

## 2020-05-17 MED ORDER — TRAZODONE HCL 50 MG PO TABS: 50.0000 mg | ORAL_TABLET | Freq: Every day | ORAL | 2 refills | Status: DC

## 2020-05-17 MED ORDER — HYDROXYZINE HCL 25 MG PO TABS: 25.0000 mg | ORAL_TABLET | Freq: Three times a day (TID) | ORAL | 2 refills | Status: DC

## 2020-05-17 MED ORDER — DULOXETINE HCL 60 MG PO CPEP: 60.0000 mg | ORAL_CAPSULE | Freq: Every day | ORAL | 2 refills | Status: DC
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
## 2020-06-03 DIAGNOSIS — Z0271 Encounter for disability determination: Secondary | ICD-10-CM

## 2020-06-18 ENCOUNTER — Ambulatory Visit (INDEPENDENT_AMBULATORY_CARE_PROVIDER_SITE_OTHER): Payer: BC Managed Care – PPO | Admitting: Licensed Clinical Social Worker

## 2020-06-18 ENCOUNTER — Other Ambulatory Visit: Payer: Self-pay

## 2020-06-18 DIAGNOSIS — F411 Generalized anxiety disorder: Secondary | ICD-10-CM | POA: Diagnosis not present

## 2020-06-23 ENCOUNTER — Encounter (HOSPITAL_COMMUNITY): Payer: Self-pay | Admitting: Licensed Clinical Social Worker

## 2020-06-23 NOTE — Progress Notes (Signed)
Virtual Visit via Video Note  I connected with Dylan Guerrero on 06/23/20 at  1:30 PM EDT by a video enabled telemedicine application and verified that I am speaking with the correct person using two identifiers.  Location: Patient: home Provider: office   I discussed the limitations of evaluation and management by telemedicine and the availability of in person appointments. The patient expressed understanding and agreed to proceed.   Type of Therapy: Individual Therapy   Treatment Goals addressed: "to get better control over my anxiety"   Interventions: CBT and Mindfulness   Summary: Dylan Guerrero is a 44 y.o. male who presents with Generalized Anxiety Disorder   Suicidal/Homicidal: No without intent/plan   Therapist Response:   Dylan Guerrero met with clinician for individual therapy. Dylan Guerrero discussed his psychiatric symptoms and current life events. Dylan Guerrero shared that he put things in place to reduce his anxiety, including taking better care of his sleep, having more assistance and breaks with caregiving, and being able to feel more in control over his home. Clinician utilized CBT to identify thoughts, feelings, and behavioral changes since control has been reinstated. Clinician discussed social interactions and noted increased activities with his Meet up group, which has been great. Dylan Guerrero also reported that he has been able to get respite care for his father through an agency in [place]. Clinician explored anxiety and noted improvement. Clinician also noted improvement in sleep once Dylan Guerrero recognized the disruption due to falling asleep with tv on.     Plan: Return again in 1 month.    Diagnosis: Axis I: Generalized Anxiety Disorder    I discussed the assessment and treatment plan with the patient. The patient was provided an opportunity to ask questions and all were answered. The patient agreed with the plan and demonstrated an understanding of the instructions.   The patient was advised to  call back or seek an in-person evaluation if the symptoms worsen or if the condition fails to improve as anticipated.  I provided 45 minutes of non-face-to-face time during this encounter.   [name], LCSW

## 2020-08-12 ENCOUNTER — Telehealth (HOSPITAL_COMMUNITY): Payer: BC Managed Care – PPO | Admitting: Psychiatry

## 2020-08-13 ENCOUNTER — Telehealth (HOSPITAL_COMMUNITY): Payer: BC Managed Care – PPO | Admitting: Psychiatry

## 2020-08-13 ENCOUNTER — Other Ambulatory Visit: Payer: Self-pay

## 2020-08-16 ENCOUNTER — Other Ambulatory Visit: Payer: Self-pay

## 2020-08-16 ENCOUNTER — Encounter (HOSPITAL_COMMUNITY): Payer: Self-pay | Admitting: Psychiatry

## 2020-08-16 ENCOUNTER — Telehealth (INDEPENDENT_AMBULATORY_CARE_PROVIDER_SITE_OTHER): Payer: BC Managed Care – PPO | Admitting: Psychiatry

## 2020-08-16 DIAGNOSIS — F411 Generalized anxiety disorder: Secondary | ICD-10-CM

## 2020-08-16 DIAGNOSIS — F4310 Post-traumatic stress disorder, unspecified: Secondary | ICD-10-CM

## 2020-08-16 MED ORDER — DULOXETINE HCL 60 MG PO CPEP: 60.0000 mg | ORAL_CAPSULE | Freq: Every day | ORAL | 2 refills | Status: DC

## 2020-08-16 MED ORDER — HYDROXYZINE HCL 25 MG PO TABS: 25.0000 mg | ORAL_TABLET | Freq: Three times a day (TID) | ORAL | 2 refills | Status: DC

## 2020-08-16 MED ORDER — TRAZODONE HCL 50 MG PO TABS: 50.0000 mg | ORAL_TABLET | Freq: Every day | ORAL | 2 refills | Status: DC
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
## 2020-09-04 ENCOUNTER — Ambulatory Visit: Payer: Self-pay | Admitting: Diagnostic Neuroimaging

## 2020-09-10 ENCOUNTER — Encounter (HOSPITAL_COMMUNITY): Payer: Self-pay | Admitting: *Deleted

## 2020-10-07 ENCOUNTER — Ambulatory Visit: Payer: BC Managed Care – PPO | Admitting: Diagnostic Neuroimaging

## 2020-10-28 ENCOUNTER — Ambulatory Visit: Payer: BC Managed Care – PPO | Admitting: Diagnostic Neuroimaging

## 2020-11-18 ENCOUNTER — Other Ambulatory Visit: Payer: Self-pay

## 2020-11-18 ENCOUNTER — Telehealth (HOSPITAL_COMMUNITY): Payer: Self-pay | Admitting: Psychiatry

## 2020-11-21 ENCOUNTER — Other Ambulatory Visit: Payer: Self-pay

## 2020-11-21 ENCOUNTER — Encounter (HOSPITAL_COMMUNITY): Payer: Self-pay | Admitting: Psychiatry

## 2020-11-21 ENCOUNTER — Telehealth (INDEPENDENT_AMBULATORY_CARE_PROVIDER_SITE_OTHER): Payer: BC Managed Care – PPO | Admitting: Psychiatry

## 2020-11-21 DIAGNOSIS — F4310 Post-traumatic stress disorder, unspecified: Secondary | ICD-10-CM

## 2020-11-21 DIAGNOSIS — F411 Generalized anxiety disorder: Secondary | ICD-10-CM

## 2020-11-21 MED ORDER — HYDROXYZINE HCL 25 MG PO TABS: 25.0000 mg | ORAL_TABLET | Freq: Three times a day (TID) | ORAL | 2 refills | Status: DC

## 2020-11-21 MED ORDER — DULOXETINE HCL 60 MG PO CPEP: 60.0000 mg | ORAL_CAPSULE | Freq: Every day | ORAL | 2 refills | Status: DC

## 2020-11-21 MED ORDER — TRAZODONE HCL 100 MG PO TABS: 100.0000 mg | ORAL_TABLET | Freq: Every day | ORAL | 2 refills | Status: DC
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
## 2020-11-25 ENCOUNTER — Ambulatory Visit: Payer: BC Managed Care – PPO | Admitting: Diagnostic Neuroimaging

## 2020-11-25 ENCOUNTER — Encounter: Payer: Self-pay | Admitting: Diagnostic Neuroimaging

## 2020-11-25 VITALS — BP 114/74 | HR 94 | Ht 75.0 in | Wt 281.8 lb

## 2020-11-25 DIAGNOSIS — G43109 Migraine with aura, not intractable, without status migrainosus: Secondary | ICD-10-CM

## 2020-11-25 MED ORDER — NURTEC 75 MG PO TBDP: 75.0000 mg | ORAL_TABLET | Freq: Every day | ORAL | 6 refills | Status: DC | PRN

## 2020-11-25 MED ORDER — AJOVY 225 MG/1.5ML ~~LOC~~ SOAJ: 225.0000 mg | SUBCUTANEOUS | 3 refills | Status: DC
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
## 2020-11-25 NOTE — Progress Notes (Signed)
Averaging migraine twice a month, lasting several hours. Headaches once a week.  For headache management pt has tried and failed Tylenol. Cannot take topamax due to side effects. Meclizine for vertigo but it's occurring more frequently.

## 2020-11-25 NOTE — Patient Instructions (Signed)
MIGRAINE WITH AURA (established problem, worsening)  - start ajovy monthly injections for migraine prevention  - start nurtec as needed for breakthrough migraines

## 2020-11-26 ENCOUNTER — Encounter: Payer: Self-pay | Admitting: *Deleted

## 2020-11-26 ENCOUNTER — Telehealth: Payer: Self-pay | Admitting: *Deleted

## 2020-11-26 NOTE — Telephone Encounter (Signed)
Nurtec PA, key: [id number], [id number]. Your information has been submitted to [employer]. To check for an updated outcome later, reopen this PA request from your dashboard. If [employer] has not responded to your request within 24 hours, contact [employer] at [phone]

## 2020-11-26 NOTE — Telephone Encounter (Signed)
Ajovy PA, key: [id number], [id number], tried/failed: propranolol, sumatriptan, topiramate, amitriptyiline, cannot take depakote due to weight gain.  Received this message: Please advise the dispensing pharmacy to contact the Pharmacy Help Line at [phone] for assistance. Faxed PA request back to Arabella Merles with pharmacy help line #. Sent patient my chart to advise.

## 2020-11-27 ENCOUNTER — Encounter: Payer: Self-pay | Admitting: *Deleted

## 2020-11-27 NOTE — Telephone Encounter (Signed)
[facility] Nurtec approved 11/26/20 - 11/26/2021. Approval letter faxed to pharmacy, sent patient my chart to inform.

## 2020-11-27 NOTE — Telephone Encounter (Signed)
Received a 2nd fax from [employer] for [place] PA. Called [facility] spoke with Fleet Contras Answered clinical questions, 11/27/20- 02/24/2021.  Ajovy approved until 02/24/21, PA # [phone]. Faxed approval to pharmacy.

## 2021-01-08 DIAGNOSIS — Z0271 Encounter for disability determination: Secondary | ICD-10-CM

## 2021-02-17 ENCOUNTER — Telehealth (INDEPENDENT_AMBULATORY_CARE_PROVIDER_SITE_OTHER): Payer: BC Managed Care – PPO | Admitting: Psychiatry

## 2021-02-17 ENCOUNTER — Other Ambulatory Visit: Payer: Self-pay

## 2021-02-17 ENCOUNTER — Encounter (HOSPITAL_COMMUNITY): Payer: Self-pay | Admitting: Psychiatry

## 2021-02-17 DIAGNOSIS — F4310 Post-traumatic stress disorder, unspecified: Secondary | ICD-10-CM

## 2021-02-17 DIAGNOSIS — F411 Generalized anxiety disorder: Secondary | ICD-10-CM

## 2021-02-17 MED ORDER — HYDROXYZINE HCL 25 MG PO TABS: 25.0000 mg | ORAL_TABLET | Freq: Three times a day (TID) | ORAL | 2 refills | Status: DC

## 2021-02-17 MED ORDER — TRAZODONE HCL 100 MG PO TABS: 100.0000 mg | ORAL_TABLET | Freq: Every day | ORAL | 2 refills | Status: DC

## 2021-02-17 MED ORDER — DULOXETINE HCL 60 MG PO CPEP: 60.0000 mg | ORAL_CAPSULE | Freq: Every day | ORAL | 2 refills | Status: DC
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
## 2021-02-21 ENCOUNTER — Other Ambulatory Visit: Payer: Self-pay | Admitting: Diagnostic Neuroimaging

## 2021-03-31 ENCOUNTER — Telehealth: Payer: Self-pay | Admitting: *Deleted

## 2021-03-31 NOTE — Telephone Encounter (Signed)
Received fax from [employer], Ajovy denied. Called [employer] because he has met criteria per letter. On hold > 15 minutes, will call back.

## 2021-03-31 NOTE — Telephone Encounter (Addendum)
Called [employer], phone # continuously rang,. Will fax in urgent appeal. Appeal letter on MD desk for signature.

## 2021-03-31 NOTE — Telephone Encounter (Signed)
Appeal letter signed, faxed to [facility]. Requested expedited appeal.

## 2021-03-31 NOTE — Telephone Encounter (Signed)
Ajovy PA, key: [id number]. G43.109, HA improved with propranolol treatment, but possibly aggravating SOB, so was stopped by pulmonologist. Sumatriptan not tolerated ("too strong"). Topiramate caused stomach cramps. Amitriptyline caused sedation. No candidate for depakote due to weight. Your information has been sent to [facility].

## 2021-04-01 NOTE — Telephone Encounter (Signed)
Tameka from [facility] called informing that the AJOVY was approved for one year 04/01/2022 REF# [id number].

## 2021-04-02 NOTE — Telephone Encounter (Signed)
Ajovy Approval and ref # faxed to [employer].

## 2021-05-08 ENCOUNTER — Other Ambulatory Visit (HOSPITAL_COMMUNITY): Payer: Self-pay | Admitting: Psychiatry

## 2021-05-08 DIAGNOSIS — F411 Generalized anxiety disorder: Secondary | ICD-10-CM

## 2021-05-08 DIAGNOSIS — F4310 Post-traumatic stress disorder, unspecified: Secondary | ICD-10-CM

## 2021-05-16 ENCOUNTER — Other Ambulatory Visit (HOSPITAL_COMMUNITY): Payer: Self-pay | Admitting: Psychiatry

## 2021-05-16 DIAGNOSIS — F411 Generalized anxiety disorder: Secondary | ICD-10-CM

## 2021-05-16 DIAGNOSIS — F4310 Post-traumatic stress disorder, unspecified: Secondary | ICD-10-CM

## 2021-05-21 ENCOUNTER — Encounter (HOSPITAL_COMMUNITY): Payer: Self-pay | Admitting: Psychiatry

## 2021-05-21 ENCOUNTER — Other Ambulatory Visit: Payer: Self-pay

## 2021-05-21 ENCOUNTER — Telehealth (INDEPENDENT_AMBULATORY_CARE_PROVIDER_SITE_OTHER): Payer: 59 | Admitting: Psychiatry

## 2021-05-21 DIAGNOSIS — F411 Generalized anxiety disorder: Secondary | ICD-10-CM

## 2021-05-21 DIAGNOSIS — F4310 Post-traumatic stress disorder, unspecified: Secondary | ICD-10-CM

## 2021-05-21 MED ORDER — DULOXETINE HCL 60 MG PO CPEP: 60.0000 mg | ORAL_CAPSULE | Freq: Every day | ORAL | 2 refills | Status: DC

## 2021-05-21 MED ORDER — HYDROXYZINE HCL 25 MG PO TABS: 25.0000 mg | ORAL_TABLET | Freq: Three times a day (TID) | ORAL | 2 refills | Status: DC

## 2021-05-21 MED ORDER — TRAZODONE HCL 100 MG PO TABS: 100.0000 mg | ORAL_TABLET | Freq: Every day | ORAL | 2 refills | Status: DC
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
## 2021-06-02 ENCOUNTER — Ambulatory Visit: Payer: BC Managed Care – PPO | Admitting: Diagnostic Neuroimaging

## 2021-06-23 ENCOUNTER — Telehealth (HOSPITAL_COMMUNITY): Payer: Self-pay | Admitting: Licensed Clinical Social Worker

## 2021-06-23 ENCOUNTER — Ambulatory Visit (HOSPITAL_COMMUNITY): Payer: Self-pay | Admitting: Licensed Clinical Social Worker

## 2021-06-23 ENCOUNTER — Other Ambulatory Visit: Payer: Self-pay

## 2021-06-23 NOTE — Telephone Encounter (Signed)
Dylan Guerrero had an appointment for an in person clinical assessment scheduled today.  Clinician outreached him by phone at 9am and 9:10am, but did not receive a response either time, and could not leave a voicemail, as [name] mailbox was not active.  Clinician informed front desk of no show event.    Noralee Stain, LCSW, LCAS 06/23/21

## 2021-07-07 ENCOUNTER — Ambulatory Visit: Payer: 59 | Admitting: Diagnostic Neuroimaging

## 2021-08-21 ENCOUNTER — Telehealth (HOSPITAL_COMMUNITY): Payer: Self-pay | Admitting: Psychiatry

## 2021-09-23 ENCOUNTER — Emergency Department (HOSPITAL_COMMUNITY): Payer: 59

## 2021-09-23 ENCOUNTER — Other Ambulatory Visit: Payer: Self-pay

## 2021-09-23 ENCOUNTER — Encounter (HOSPITAL_COMMUNITY): Payer: Self-pay

## 2021-09-23 ENCOUNTER — Observation Stay (HOSPITAL_COMMUNITY)
Admission: EM | Admit: 2021-09-23 | Discharge: 2021-09-25 | Disposition: A | Discharge status: Home / Self Care | Payer: 59 | Attending: Family Medicine | Admitting: Family Medicine

## 2021-09-23 DIAGNOSIS — I10 Essential (primary) hypertension: Secondary | ICD-10-CM | POA: Diagnosis not present

## 2021-09-23 DIAGNOSIS — Z79899 Other long term (current) drug therapy: Secondary | ICD-10-CM | POA: Diagnosis not present

## 2021-09-23 DIAGNOSIS — G43109 Migraine with aura, not intractable, without status migrainosus: Secondary | ICD-10-CM | POA: Diagnosis not present

## 2021-09-23 DIAGNOSIS — R531 Weakness: Secondary | ICD-10-CM | POA: Diagnosis not present

## 2021-09-23 DIAGNOSIS — R519 Headache, unspecified: Secondary | ICD-10-CM

## 2021-09-23 DIAGNOSIS — J09X9 Influenza due to identified novel influenza A virus with other manifestations: Secondary | ICD-10-CM | POA: Insufficient documentation

## 2021-09-23 DIAGNOSIS — Z20822 Contact with and (suspected) exposure to covid-19: Secondary | ICD-10-CM | POA: Insufficient documentation

## 2021-09-23 LAB — COMPREHENSIVE METABOLIC PANEL
ALT: 24 U/L (ref 0–44)
AST: 27 U/L (ref 15–41)
Albumin: 4 g/dL (ref 3.5–5.0)
Alkaline Phosphatase: 97 U/L (ref 38–126)
Anion gap: 11 (ref 5–15)
BUN: 11 mg/dL (ref 6–20)
CO2: 28 mmol/L (ref 22–32)
Calcium: 9 mg/dL (ref 8.9–10.3)
Chloride: 92 mmol/L — ABNORMAL LOW (ref 98–111)
Creatinine, Ser: 1.34 mg/dL — ABNORMAL HIGH (ref 0.61–1.24)
GFR, Estimated: >60 mL/min (ref >60)
Glucose, Bld: 98 mg/dL (ref 70–99)
Potassium: 3.2 mmol/L — ABNORMAL LOW (ref 3.5–5.1)
Sodium: 131 mmol/L — ABNORMAL LOW (ref 135–145)
Total Bilirubin: 0.6 mg/dL (ref 0.3–1.2)
Total Protein: 7.4 g/dL (ref 6.5–8.1)

## 2021-09-23 LAB — APTT: aPTT: 22 seconds — ABNORMAL LOW (ref 24–36)

## 2021-09-23 LAB — CBG MONITORING, ED: Glucose-Capillary: 108 mg/dL — ABNORMAL HIGH (ref 70–99)

## 2021-09-23 LAB — PROTIME-INR
INR: 1.1 (ref 0.8–1.2)
Prothrombin Time: 14.2 seconds (ref 11.4–15.2)

## 2021-09-23 MED ORDER — SODIUM CHLORIDE 0.9% FLUSH
3.0000 mL | Freq: Once | INTRAVENOUS | Status: DC
Start: 2021-09-23 — End: 2021-09-25

## 2021-09-23 MED ORDER — SODIUM CHLORIDE 0.9 % IV BOLUS
1000.0000 mL | Freq: Once | INTRAVENOUS | Status: AC
  Administered 2021-09-23: 21:00:00 1000 mL via INTRAVENOUS

## 2021-09-23 MED ORDER — SODIUM CHLORIDE 0.9 % IV SOLN: INTRAVENOUS | Status: DC

## 2021-09-23 MED ORDER — DIPHENHYDRAMINE HCL 50 MG/ML IJ SOLN
12.5000 mg | Freq: Once | INTRAMUSCULAR | Status: AC
  Administered 2021-09-23: 21:00:00 12.5 mg via INTRAVENOUS
  Filled 2021-09-23: qty 1

## 2021-09-23 MED ORDER — METOCLOPRAMIDE HCL 5 MG/ML IJ SOLN
10.0000 mg | Freq: Once | INTRAMUSCULAR | Status: AC
  Administered 2021-09-23: 21:00:00 10 mg via INTRAVENOUS
  Filled 2021-09-23: qty 2

## 2021-09-23 MED ORDER — MORPHINE SULFATE (PF) 4 MG/ML IV SOLN
4.0000 mg | Freq: Once | INTRAVENOUS | Status: AC
  Administered 2021-09-23: 21:00:00 4 mg via INTRAVENOUS
  Filled 2021-09-23: qty 1

## 2021-09-23 NOTE — ED Triage Notes (Signed)
Pt BIB [facility] for eval as possible code stroke. Pt reports that he was watching TV this evening and developed sharp, sudden onset L sided head pain w/ L sided arm droop w/ numbness, L arm drift, L leg was raised lower than R on exam for EMS. On arrival to ED, EMS reports deficits appear to be improving. CBG 150 for EMS.

## 2021-09-23 NOTE — Consult Note (Signed)
NEUROLOGY CONSULTATION NOTE   Date of service: September 23, 2021 Patient Name: Dylan Guerrero MRN:  [id number] DOB:  Jun 29, 1976 Reason for consult: "Stroke code for Left sided weakness and headache" Requesting Provider: Jacalyn Lefevre, MD _ _ _   _ __   _ __ _ _  __ __   _ __   __ _  History of Present Illness  Dylan Guerrero is a 45 y.o. male with PMH significant for GERD, HTN, HLD, migraine, OSA who was sitting on couch and watching TV when he had a brief episode of loss of awareness for a few seconds followed by sharp left temple headache and then left sided weakness and numbness. He was brought in as a code stroke by EMS. Symptoms have been improving enroute and on arrival.  mRS: 0 tNKASe: not offered, mild symptoms that are rapidly improving. Thrombectomy: Not offered, low suspicion for LVO. NIHSS components Score: Comment  1a Level of Conscious 0[x]  1[]  2[]  3[]      1b LOC Questions 0[x]  1[]  2[]       1c LOC Commands 0[x]  1[]  2[]       2 Best Gaze 0[x]  1[]  2[]       3 Visual 0[x]  1[]  2[]  3[]      4 Facial Palsy 0[x]  1[]  2[]  3[]      5a Motor Arm - left 0[x]  1[]  2[]  3[]  4[]  UN[]    5b Motor Arm - Right 0[x]  1[]  2[]  3[]  4[]  UN[]    6a Motor Leg - Left 0[x]  1[]  2[]  3[]  4[]  UN[]    6b Motor Leg - Right 0[x]  1[]  2[]  3[]  4[]  UN[]    7 Limb Ataxia 0[x]  1[]  2[]  3[]  UN[]     8 Sensory 0[]  1[x]  2[]  UN[]      9 Best Language 0[]  1[]  2[]  3[]      10 Dysarthria 0[x]  1[]  2[]  UN[]      11 Extinct. and Inattention 0[x]  1[]  2[]       TOTAL: 1     ROS   Constitutional Denies weight loss, fever and chills.   HEENT Denies changes in vision and hearing.   Respiratory Denies SOB and cough.   CV Denies palpitations and CP   GI Denies abdominal pain, nausea, vomiting and diarrhea.   GU Denies dysuria and urinary frequency.   MSK Denies myalgia and joint pain.   Skin Denies rash and pruritus.   Neurological Endorses headache but no syncope.   Psychiatric Denies recent changes in mood. Denies anxiety and  depression.    Past History   Past Medical History:  Diagnosis Date   Abdominal pain    Allergy    Anxiety    Arthritis    Carpal tunnel syndrome    Depression    GERD (gastroesophageal reflux disease)    Headache(784.0)    Hernia    Hypertension    Hypokalemia    Infection of the inner ear    Migraine    Sinus infection    Sleep apnea    Spider bite    Past Surgical History:  Procedure Laterality Date   APPENDECTOMY  1984   HERNIA REPAIR  01/30/2011   Right Inguinal Hernia repair with mesh   HERNIA REPAIR  01/30/2011   Umbilical Hernia repair with meash   Family History  Problem Relation Age of Onset   Scleroderma Mother    Depression Father    Anxiety disorder Father    Diabetes Other    Hypertension Other    Dementia Paternal Grandfather  ADD / ADHD Other    Social History   Socioeconomic History   Marital status: Single    Spouse name: Not on file   Number of children: 0   Years of education: college   Highest education level: Not on file  Occupational History    Comment: disability  Tobacco Use   Smoking status: Never   Smokeless tobacco: Never  Vaping Use   Vaping Use: Never used  Substance and Sexual Activity   Alcohol use: No    Alcohol/week: 0.0 standard drinks    Comment: quit 10/05/2009   Drug use: No   Sexual activity: Not Currently  Other Topics Concern   Not on file  Social History Narrative   Pt lives at home alone, [place].   Caffeine Use- maybe 1 cup of coffee a week    Patient has some financial restraints and transportation issues that have at times affected his ability to get medications and to meet basic needs adequately.    Social Determinants of Health   Financial Resource Strain: Not on file  Food Insecurity: Not on file  Transportation Needs: Not on file  Physical Activity: Not on file  Stress: Not on file  Social Connections: Not on file   Allergies  Allergen Reactions   Ibuprofen Other (See Comments)     Acid reflux   Ketoprofen Other (See Comments)    Muscle and stomach cramps    Topiramate Other (See Comments)    Abdominal cramps and constipation    Adhesive [Tape] Rash    Medical tape    Medications  (Not in a hospital admission)    Vitals   There were no vitals filed for this visit.   There is no height or weight on file to calculate BMI.  Physical Exam   General: Laying comfortably in bed; in no acute distress.  HENT: Normal oropharynx and mucosa. Normal external appearance of ears and nose.  Neck: Supple, no pain or tenderness  CV: No JVD. No peripheral edema.  Pulmonary: Symmetric Chest rise. Normal respiratory effort.  Abdomen: Soft to touch, non-tender.  Ext: No cyanosis, edema, or deformity  Skin: No rash. Normal palpation of skin.   Musculoskeletal: Normal digits and nails by inspection. No clubbing.   Neurologic Examination  Mental status/Cognition: Alert, oriented to self, place, month and year, good attention.  Speech/language: Fluent, comprehension intact, object naming intact, repetition intact.  Cranial nerves:   CN II Pupils equal and reactive to light, no VF deficits    CN III,IV,VI EOM intact, no gaze preference or deviation, no nystagmus    CN V normal sensation in V1, V2, and V3 segments bilaterally    CN VII no asymmetry, no nasolabial fold flattening    CN VIII normal hearing to speech    CN IX & X normal palatal elevation, no uvular deviation    CN XI 5/5 head turn and 5/5 shoulder shrug bilaterally    CN XII midline tongue protrusion    Motor:  Muscle bulk: normal, tone normal, pronator drift none tremor none Mvmt Root Nerve  Muscle Right Left Comments  SA C5/6 Ax Deltoid     EF C5/6 Mc Biceps     EE C6/7/8 Rad Triceps     WF C6/7 Med FCR     WE C7/8 PIN ECU     F Ab C8/T1 U ADM/FDI 5 4+   HF L1/2/3 Fem Illopsoas 4+ 4+   KE L2/3/4 Fem Quad  DF L4/5 D Peron Tib Ant     PF S1/2 Tibial Grc/Sol      Reflexes:  Right Left Comments   Pectoralis      Biceps (C5/6) 2 2   Brachioradialis (C5/6) 2 2    Triceps (C6/7) 2 2    Patellar (L3/4) 2 2    Achilles (S1)      Hoffman      Plantar     Jaw jerk    Sensation:  Light touch Decreased in LUE and LLE to touch.   Pin prick    Temperature    Vibration   Proprioception    Coordination/Complex Motor:  - Finger to Nose intact BL - Heel to shin intact BL - Rapid alternating movement are normal - Gait: Deferred given his size and L sided weakness.  Labs   CBC: No results for input(s): WBC, NEUTROABS, HGB, HCT, MCV, PLT in the last 168 hours.  Basic Metabolic Panel:  Lab Results  Component Value Date   NA 143 05/18/2018   K 3.9 05/18/2018   CO2 28 05/18/2018   GLUCOSE 91 05/18/2018   BUN 10 05/18/2018   CREATININE 1.29 (H) 05/18/2018   CALCIUM 10.0 05/18/2018   GFRNONAA >60 05/18/2018   GFRAA >60 05/18/2018   Lipid Panel: No results found for: LDLCALC HgbA1c: No results found for: HGBA1C Urine Drug Screen:     Component Value Date/Time   LABOPIA (A) 05/18/2018 1541    Result not available. Reagent lot number recalled by manufacturer.   COCAINSCRNUR NONE DETECTED 05/18/2018 1541   LABBENZ NONE DETECTED 05/18/2018 1541   AMPHETMU NONE DETECTED 05/18/2018 1541   THCU NONE DETECTED 05/18/2018 1541   LABBARB NONE DETECTED 05/18/2018 1541    Alcohol Level     Component Value Date/Time   ETH <10 05/18/2018 1454    CT Head without contrast(Personally reviewed): CTH was negative for a large hypodensity concerning for a large territory infarct or hyperdensity concerning for an ICH  MRI Brain: pending  rEEG:  pending  Impression   Dylan Guerrero is a 45 y.o. male with PMH significant for GERD, HTN, HLD, migraine, OSA who was sitting on couch and watching TV when he had a brief episode of loss of awareness for a few seconds followed by sharp left temple headache and then left sided weakness and numbness. He was brought in as a code stroke by EMS.  Symptoms have been improving enroute and on arrival.. His neurologic examination is notable for L sided weakness and numbness with rapid improvement in his symptoms.  Has hx of migraine but does not think that this headache is consistent with his migraine, this is much sharper headache and his migraine is also typically a right sided headache. No prior hx of seizures. Had a full body floating sensation and whole body numbness in the past with his headache but nothing like this.  I suspect that the current episode could have been a hemiplegic migraine vs a TIA. He has never had anything like this in the past with his headache. He does have several stroke risk factors so will get a TIA workup.  Recommendations  Plan:  Recommend that primary team order following: - Frequent Neuro checks per stroke unit protocol - Recommend brain imaging with MRI Brain without contrast - Recommend Vascular imaging with MRA Angio Head without contrast and US Carotid doppler - Recommend obtaining TTE - Recommend obtaining Lipid panel with LDL - Please start statin if LDL >  70 - Recommend HbA1c - Antithrombotic - aspirin  daily. - Recommend DVT ppx - SBP goal - permissive hypertension first 24 h < 220/110. Held home meds.  - Recommend Telemetry monitoring for arrythmia - Recommend bedside swallow screen prior to PO intake. - Stroke education booklet - Recommend PT/OT/SLP consult - Recommend Urine Tox screen. - Will also add a routine EEG in addition to above given he did have a brief episode of loss of consciousness before the symptoms started.  _____________________________________________________________________  Plan discussed with Dr. [name] Plate over secure chat.   Thank you for the opportunity to take part in the care of this patient. If you have any further questions, please contact the neurology consultation attending.  Signed,  Erick Blinks [facility] Pager Number [phone] _  _ _   _ __   _ __ _ _  __ __   _ __   __ _

## 2021-09-23 NOTE — ED Provider Notes (Signed)
[facility] EMERGENCY DEPARTMENT Provider Note   CSN: [id number] Arrival date & time: 09/23/21  1956  An emergency department physician performed an initial assessment on this suspected stroke patient at 27.  History Chief Complaint  Patient presents with   Code Stroke    Dylan Guerrero is a 45 y.o. male.  Pt presents to the ED today as a code stroke.  Pt said he was watching TV this evening and developed a sudden headache on the left side of his head with left arm weakness.  Weakness is better, but headache is still there.  This is different than usual headaches.        Past Medical History:  Diagnosis Date   Abdominal pain    Allergy    Anxiety    Arthritis    Carpal tunnel syndrome    Depression    GERD (gastroesophageal reflux disease)    Headache(784.0)    Hernia    Hypertension    Hypokalemia    Infection of the inner ear    Migraine    Sinus infection    Sleep apnea    Spider bite     Patient Active Problem List   Diagnosis Date Noted   MDD (major depressive disorder), single episode, severe , no psychosis (HCC) 05/19/2018   Generalized anxiety disorder    Panic disorder    Posttraumatic stress disorder    Morbid obesity due to excess calories (HCC) 04/11/2017   Essential hypertension 04/11/2017   DOE (dyspnea on exertion) 04/09/2017   Migraine with aura 01/09/2013   Umbilical pain 10/22/2011    Past Surgical History:  Procedure Laterality Date   APPENDECTOMY  1984   HERNIA REPAIR  01/30/2011   Right Inguinal Hernia repair with mesh   HERNIA REPAIR  01/30/2011   Umbilical Hernia repair with meash       Family History  Problem Relation Age of Onset   Scleroderma Mother    Depression Father    Anxiety disorder Father    Diabetes Other    Hypertension Other    Dementia Paternal Grandfather    ADD / ADHD Other     Social History   Tobacco Use   Smoking status: Never   Smokeless tobacco: Never  Vaping Use    Vaping Use: Never used  Substance Use Topics   Alcohol use: No    Alcohol/week: 0.0 standard drinks    Comment: quit 10/05/2009   Drug use: No    Home Medications Prior to Admission medications   Medication Sig Start Date End Date Taking? Authorizing Provider  albuterol (PROVENTIL HFA;VENTOLIN HFA) 108 (90 Base) MCG/ACT inhaler Inhale 2 puffs into the lungs every 6 (six) hours as needed for wheezing or shortness of breath.    [provider]  amLODipine (NORVASC) 10 MG tablet Take 10 mg by mouth daily.    [provider]  bisoprolol (ZEBETA) 5 MG tablet TAKE 1 TABLET BY MOUTH EVERY DAY 06/20/18   Nyoka Cowden, MD  colchicine 0.6 MG tablet colchicine 0.6 mg tablet    Rometta Emery, MD  DULoxetine (CYMBALTA) 60 MG capsule Take 1 capsule (60 mg total) by mouth daily. 05/21/21 06/20/21  Arfeen, Phillips Grout, MD  fluticasone (FLONASE) 50 MCG/ACT nasal spray Place 1 spray into both nostrils daily as needed for allergies or rhinitis.     [provider]  Fremanezumab-vfrm (AJOVY) 225 MG/1.5ML SOAJ Inject 225 mg into the skin every 30 (thirty) days.  11/25/20   Penumalli, Glenford Bayley, MD  Glycerin-Hypromellose-PEG 400 0.2-0.2-1 % SOLN Place 1-2 drops into both eyes 2 (two) times daily.    [provider]  hydrochlorothiazide (HYDRODIURIL) 25 MG tablet Take 25 mg by mouth daily.    [provider]  hydrocortisone cream 1 % Apply 1 application topically 2 (two) times daily as needed for itching (RASH). Patient not taking: Reported on 11/25/2020    [provider]  hydrOXYzine (ATARAX/VISTARIL) 25 MG tablet Take 1 tablet (25 mg total) by mouth 3 (three) times daily. 05/21/21   Arfeen, Phillips Grout, MD  linaclotide Karlene Einstein) 145 MCG CAPS capsule Take 145 mcg by mouth daily. Patient not taking: Reported on 11/25/2020    [provider]  loratadine (CLARITIN) 10 MG tablet Take 10 mg by mouth daily.    [provider]  meclizine (ANTIVERT) 25 MG  tablet Take 25 mg by mouth 2 (two) times daily.    [provider]  omeprazole (PRILOSEC) 40 MG capsule TAKE 1 CAPSULE BY MOUTH EVERY DAY Patient taking differently: Take 40 mg by mouth daily. 05/16/18   Nyoka Cowden, MD  Rimegepant Sulfate (NURTEC) 75 MG TBDP Take 75 mg by mouth daily as needed. 11/25/20   Penumalli, Glenford Bayley, MD  traZODone (DESYREL) 100 MG tablet Take 1 tablet (100 mg total) by mouth at bedtime. 05/21/21   Arfeen, Phillips Grout, MD  triamcinolone cream (KENALOG) 0.1 % Apply 1 application topically as needed (for rashes).  04/01/18   [provider]  UNABLE TO FIND Med Name: albuterol .021%, (25 x 3 mL) solution for nebulizer    [provider]    Allergies    Ibuprofen, Ketoprofen, Topiramate, and Adhesive [tape]  Review of Systems   Review of Systems  Neurological:  Positive for weakness and headaches.   Physical Exam Updated Vital Signs BP (!) 156/87    Pulse (!) 123    Temp 98.3 F (36.8 C) (Temporal)    Resp (!) 25    Ht  (1.905 m)    Wt 126 kg    SpO2 97%    BMI 34.72 kg/m   Physical Exam Vitals and nursing note reviewed.  Constitutional:      Appearance: Normal appearance.  HENT:     Head: Normocephalic and atraumatic.     Right Ear: External ear normal.     Left Ear: External ear normal.     Nose: Nose normal.     Mouth/Throat:     Mouth: Mucous membranes are moist.     Pharynx: Oropharynx is clear.  Eyes:     Extraocular Movements: Extraocular movements intact.     Conjunctiva/sclera: Conjunctivae normal.     Pupils: Pupils are equal, round, and reactive to light.  Cardiovascular:     Rate and Rhythm: Normal rate and regular rhythm.     Pulses: Normal pulses.     Heart sounds: Normal heart sounds.  Pulmonary:     Effort: Pulmonary effort is normal.     Breath sounds: Normal breath sounds.  Abdominal:     General: Abdomen is flat. Bowel sounds are normal.     Palpations: Abdomen is soft.  Musculoskeletal:         General: Normal range of motion.     Cervical back: Normal range of motion and neck supple.  Skin:    General: Skin is warm.     Capillary Refill: Capillary refill takes less than 2 seconds.  Neurological:  General: No focal deficit present.     Mental Status: He is alert and oriented to person, place, and time.  Psychiatric:        Mood and Affect: Mood normal.        Behavior: Behavior normal.    ED Results / Procedures / Treatments   Labs (all labs ordered are listed, but only abnormal results are displayed) Labs Reviewed  APTT - Abnormal; Notable for the following components:      Result Value   aPTT 22 (*)    All other components within normal limits  COMPREHENSIVE METABOLIC PANEL - Abnormal; Notable for the following components:   Sodium 131 (*)    Potassium 3.2 (*)    Chloride 92 (*)    Creatinine, Ser 1.34 (*)    All other components within normal limits  CBG MONITORING, ED - Abnormal; Notable for the following components:   Glucose-Capillary 108 (*)    All other components within normal limits  PROTIME-INR  CBC WITH DIFFERENTIAL/PLATELET  I-STAT CHEM 8, ED    EKG EKG Interpretation  Date/Time:  Tuesday September 23 2021 20:14:45 EST Ventricular Rate:  102 PR Interval:  165 QRS Duration: 79 QT Interval:  330 QTC Calculation: 430 R Axis:   37 Text Interpretation: Sinus tachycardia Since last tracing rate faster Confirmed by Jacalyn Lefevre [phone]) on 09/23/2021 11:10:06 PM  Radiology CT HEAD CODE STROKE WO CONTRAST  Result Date: 09/23/2021 CLINICAL DATA:  Code stroke. Acute neuro deficit. Left-sided weakness EXAM: CT HEAD WITHOUT CONTRAST TECHNIQUE: Contiguous axial images were obtained from the base of the skull through the vertex without intravenous contrast. COMPARISON:  CT head 05/27/2017 FINDINGS: Brain: No evidence of acute infarction, hemorrhage, hydrocephalus, extra-axial collection or mass lesion/mass effect. Vascular: Negative for hyperdense  vessel Skull: Negative Sinuses/Orbits: Paranasal sinuses clear.  Negative orbit Other: None ASPECTS (Alberta Stroke Program Early CT Score) - Ganglionic level infarction (caudate, lentiform nuclei, internal capsule, insula, M1-M3 cortex): 7 - Supraganglionic infarction (M4-M6 cortex): 3 Total score (0-10 with 10 being normal): 10 IMPRESSION: 1. Negative CT head 2. ASPECTS is 10 3. Code stroke imaging results were communicated on 09/23/2021 at 8:14 pm to provider Derry Lory via text page Electronically Signed   By: Marlan Palau M.D.   On: 09/23/2021 20:20    Procedures Procedures   Medications Ordered in ED Medications  sodium chloride flush (NS) 0.9 % injection 3 mL (3 mLs Intravenous Not Given 09/23/21 2039)  sodium chloride 0.9 % bolus 1,000 mL (0 mLs Intravenous Stopped 09/23/21 2237)    And  0.9 %  sodium chloride infusion ( Intravenous Not Given 09/23/21 2238)  diphenhydrAMINE (BENADRYL) injection 12.5 mg (12.5 mg Intravenous Given 09/23/21 2053)  metoCLOPramide (REGLAN) injection 10 mg (10 mg Intravenous Given 09/23/21 2053)  morphine 4 MG/ML injection 4 mg (4 mg Intravenous Given 09/23/21 2053)    ED Course  I have reviewed the triage vital signs and the nursing notes.  Pertinent labs & imaging results that were available during my care of the patient were reviewed by me and considered in my medical decision making (see chart for details).    MDM Rules/Calculators/A&P Dr. Derry Lory (neurology) requests a MRI and EEG as CT nl.  Pt may have a complex migraine, so headache is treated.  Pt is not a candidate for tnk or thrombectomy because sx are rapidly improving.  MRI pending.  If ok, he should be able to go home.   Pt signed out to Dr. [name] Plate (  triad) at shift change.  CRITICAL CARE Performed by: Jacalyn Lefevre   Total critical care time: 30 minutes  Critical care time was exclusive of separately billable procedures and treating other patients.  Critical care was  necessary to treat or prevent imminent or life-threatening deterioration.  Critical care was time spent personally by me on the following activities: development of treatment plan with patient and/or surrogate as well as nursing, discussions with consultants, evaluation of patient's response to treatment, examination of patient, obtaining history from patient or surrogate, ordering and performing treatments and interventions, ordering and review of laboratory studies, ordering and review of radiographic studies, pulse oximetry and re-evaluation of patient's condition.   Final Clinical Impression(s) / ED Diagnoses Final diagnoses:  Acute nonintractable headache, unspecified headache type    Rx / DC Orders ED Discharge Orders     None        Jacalyn Lefevre, MD 09/23/21 2311

## 2021-09-23 NOTE — ED Provider Notes (Signed)
°  Provider Note MRN:  [id number]  Arrival date & time: 09/23/21    ED Course and Medical Decision Making  Assumed care from Dr. Particia Nearing at shift change.  Stroke vs complex migraine awaiting MRI.  Neurology recommending admission for TIA work-up.  Procedures  Final Clinical Impressions(s) / ED Diagnoses     ICD-10-CM   1. Acute nonintractable headache, unspecified headache type  R51.9       ED Discharge Orders     None       Discharge Instructions   None     Elmer Sow. [name] Plate, MD [facility] Emergency Medicine [facility] [email or web address]    Sabas Sous, MD 09/24/21 [phone]

## 2021-09-24 ENCOUNTER — Emergency Department (HOSPITAL_COMMUNITY): Payer: 59

## 2021-09-24 ENCOUNTER — Emergency Department (HOSPITAL_BASED_OUTPATIENT_CLINIC_OR_DEPARTMENT_OTHER): Payer: 59

## 2021-09-24 DIAGNOSIS — G459 Transient cerebral ischemic attack, unspecified: Secondary | ICD-10-CM | POA: Diagnosis not present

## 2021-09-24 DIAGNOSIS — R531 Weakness: Secondary | ICD-10-CM

## 2021-09-24 DIAGNOSIS — G43109 Migraine with aura, not intractable, without status migrainosus: Secondary | ICD-10-CM | POA: Diagnosis not present

## 2021-09-24 DIAGNOSIS — I10 Essential (primary) hypertension: Secondary | ICD-10-CM | POA: Diagnosis not present

## 2021-09-24 LAB — ECHOCARDIOGRAM COMPLETE BUBBLE STUDY
AR max vel: 2.96 cm2
AV Area VTI: 2.79 cm2
AV Area mean vel: 2.75 cm2
AV Mean grad: 3 mmHg
AV Peak grad: 5.6 mmHg
Ao pk vel: 1.18 m/s
Area-P 1/2: 4.71 cm2
Height: 75 in
S' Lateral: 3 cm
Weight: 4444.47 oz

## 2021-09-24 LAB — RAPID URINE DRUG SCREEN, HOSP PERFORMED
Amphetamines: NOT DETECTED
Barbiturates: NOT DETECTED
Benzodiazepines: NOT DETECTED
Cocaine: NOT DETECTED
Opiates: POSITIVE — AB
Tetrahydrocannabinol: NOT DETECTED

## 2021-09-24 LAB — LIPID PANEL
Cholesterol: 124 mg/dL (ref 0–200)
HDL: 55 mg/dL (ref >40)
LDL Cholesterol: 48 mg/dL (ref 0–99)
Total CHOL/HDL Ratio: 2.3 RATIO
Triglycerides: 104 mg/dL (ref <150)
VLDL: 21 mg/dL (ref 0–40)

## 2021-09-24 LAB — POTASSIUM: Potassium: 4 mmol/L (ref 3.5–5.1)

## 2021-09-24 LAB — RESP PANEL BY RT-PCR (FLU A&B, COVID) ARPGX2
Influenza A by PCR: POSITIVE — AB
Influenza B by PCR: NEGATIVE
SARS Coronavirus 2 by RT PCR: NEGATIVE

## 2021-09-24 LAB — HEMOGLOBIN A1C
Hgb A1c MFr Bld: 5.9 % — ABNORMAL HIGH (ref 4.8–5.6)
Mean Plasma Glucose: 122.63 mg/dL

## 2021-09-24 LAB — HIV ANTIBODY (ROUTINE TESTING W REFLEX): HIV Screen 4th Generation wRfx: NONREACTIVE

## 2021-09-24 MED ORDER — ACETAMINOPHEN 325 MG PO TABS
650.0000 mg | ORAL_TABLET | ORAL | Status: DC | PRN
  Administered 2021-09-24: 16:00:00 650 mg via ORAL
  Filled 2021-09-24: qty 2

## 2021-09-24 MED ORDER — POTASSIUM CHLORIDE CRYS ER 20 MEQ PO TBCR
40.0000 meq | EXTENDED_RELEASE_TABLET | Freq: Once | ORAL | Status: AC
  Administered 2021-09-24: 06:00:00 40 meq via ORAL
  Filled 2021-09-24: qty 2

## 2021-09-24 MED ORDER — ACETAMINOPHEN 650 MG RE SUPP: 650.0000 mg | RECTAL | Status: DC | PRN

## 2021-09-24 MED ORDER — ASPIRIN EC 81 MG PO TBEC
81.0000 mg | DELAYED_RELEASE_TABLET | Freq: Every day | ORAL | Status: DC
  Administered 2021-09-24 – 2021-09-25 (×2): 81 mg via ORAL
  Filled 2021-09-24 (×2): qty 1

## 2021-09-24 MED ORDER — OSELTAMIVIR PHOSPHATE 75 MG PO CAPS
75.0000 mg | ORAL_CAPSULE | Freq: Two times a day (BID) | ORAL | Status: DC
  Administered 2021-09-24 – 2021-09-25 (×2): 75 mg via ORAL
  Filled 2021-09-24 (×3): qty 1

## 2021-09-24 MED ORDER — ACETAMINOPHEN 160 MG/5ML PO SOLN: 650.0000 mg | ORAL | Status: DC | PRN

## 2021-09-24 MED ORDER — SENNOSIDES-DOCUSATE SODIUM 8.6-50 MG PO TABS: 1.0000 | ORAL_TABLET | Freq: Every evening | ORAL | Status: DC | PRN

## 2021-09-24 MED ORDER — RIMEGEPANT SULFATE 75 MG PO TBDP: 75.0000 mg | ORAL_TABLET | Freq: Every day | ORAL | Status: DC | PRN

## 2021-09-24 MED ORDER — STROKE: EARLY STAGES OF RECOVERY BOOK
Freq: Once | Status: AC
  Filled 2021-09-24 (×2): qty 1

## 2021-09-24 MED ORDER — LORAZEPAM 2 MG/ML IJ SOLN
0.5000 mg | Freq: Once | INTRAMUSCULAR | Status: AC
  Administered 2021-09-24: 01:00:00 0.5 mg via INTRAVENOUS

## 2021-09-24 MED ORDER — KETOROLAC TROMETHAMINE 30 MG/ML IJ SOLN
30.0000 mg | Freq: Once | INTRAMUSCULAR | Status: AC
  Administered 2021-09-24: 12:00:00 30 mg via INTRAVENOUS
  Filled 2021-09-24: qty 1

## 2021-09-24 MED ORDER — LORAZEPAM 2 MG/ML IJ SOLN
INTRAMUSCULAR | Status: AC
  Filled 2021-09-24: qty 1

## 2021-09-24 MED ORDER — ENOXAPARIN SODIUM 40 MG/0.4ML IJ SOSY
40.0000 mg | PREFILLED_SYRINGE | INTRAMUSCULAR | Status: DC
  Administered 2021-09-24: 09:00:00 40 mg via SUBCUTANEOUS
  Filled 2021-09-24: qty 0.4

## 2021-09-24 MED ORDER — MORPHINE SULFATE (PF) 2 MG/ML IV SOLN: 1.0000 mg | INTRAVENOUS | Status: DC | PRN

## 2021-09-24 MED ORDER — ONDANSETRON HCL 4 MG/2ML IJ SOLN
4.0000 mg | Freq: Once | INTRAMUSCULAR | Status: AC
  Administered 2021-09-24: 12:00:00 4 mg via INTRAVENOUS
  Filled 2021-09-24: qty 2

## 2021-09-24 MED ORDER — SUMATRIPTAN SUCCINATE 50 MG PO TABS
50.0000 mg | ORAL_TABLET | Freq: Once | ORAL | Status: AC
  Administered 2021-09-24: 10:00:00 50 mg via ORAL
  Filled 2021-09-24: qty 1

## 2021-09-24 NOTE — Discharge Summary (Addendum)
Physician Discharge Summary  Dylan Guerrero UJW:[id number] DOB: 1976/05/24 DOA: 09/23/2021  PCP: Rometta Emery, MD  Admit date: 09/23/2021 Discharge date: 09/25/2021  Time spent: 60 minutes  Recommendations for Outpatient Follow-up:  Follow-up PCP in 2 weeks  Discharge Diagnoses:  Principal Problem:   Left-sided weakness Active Problems:   Migraine with aura   Morbid obesity due to excess calories [facility])   Essential hypertension   Discharge Condition: Stable  Diet recommendation: Heart healthy diet  Filed Weights   09/23/21 2000 09/23/21 2015  Weight: 126 kg 126 kg    History of present illness:  45 year old male with a history of migraine, hypertension, anxiety, PTSD, asthma, sleep apnea, GERD presented with loss of consciousness and left-sided weakness.  He initially presented as code stroke but no tPA was given due to resolving symptoms.  CT head was negative.  Neurology saw the patient and recommended stroke work-up for possible hemiplegic migraine versus TIA.  Hospital Course:   Complicated migraine -Symptoms have resolved; seems more likely hemiplegic migraine than TIA -Neurology was consulted, they recommended TIA work-up, however stroke team signed off as patient has more likely complicated migraine than TIA -Patient had complete work-up in the hospital including MRI brain, MRA which were negative -Carotid duplex showed no significant stenosis -Echocardiogram was unremarkable -EEG unremarkable -Hemoglobin A1c 5.9 -LDL 48 -Continue home medications for migraine  Hypertension -will hold HCTZ, as his BP is soft -Continue amlodipine, bisoprolol  Hypokalemia -Replete  Influenza A -Patient found to be positive for influenza A by PCR -Has been having symptoms for more than 3 days -started on tamiflu    Procedures: Carotid duplex Echocardiogram   Consultations: Neurology  Discharge Exam: Vitals:   09/25/21 0323 09/25/21 0842  BP: 96/60 122/85   Pulse: 87 (!) 101  Resp: 16 20  Temp: 98.5 F (36.9 C) 98.4 F (36.9 C)  SpO2: 100% 96%    General: Appears in no acute distress Cardiovascular: S1-S2, regular, no murmur auscultated Respiratory: Clear to auscultation bilaterally  Discharge Instructions   Discharge Instructions     Diet - low sodium heart healthy   Complete by: As directed    Increase activity slowly   Complete by: As directed       Allergies as of 09/25/2021       Reactions   Ibuprofen Other (See Comments)   Acid reflux   Ketoprofen Other (See Comments)   Muscle and stomach cramps    Topiramate Other (See Comments)   Abdominal cramps and constipation   Adhesive [tape] Rash   Medical tape        Medication List     STOP taking these medications    hydrochlorothiazide 25 MG tablet Commonly known as: HYDRODIURIL   loratadine 10 MG tablet Commonly known as: CLARITIN       TAKE these medications    Ajovy 225 MG/1.5ML Soaj Generic drug: Fremanezumab-vfrm Inject 225 mg into the skin every 30 (thirty) days.   albuterol 108 (90 Base) MCG/ACT inhaler Commonly known as: VENTOLIN HFA Inhale 2 puffs into the lungs every 6 (six) hours as needed for wheezing or shortness of breath.   amLODipine 10 MG tablet Commonly known as: NORVASC Take 10 mg by mouth daily.   bisoprolol 5 MG tablet Commonly known as: ZEBETA TAKE 1 TABLET BY MOUTH EVERY DAY   colchicine 0.6 MG tablet Take 0.6 mg by mouth daily as needed (gout flares).   cyclobenzaprine 10 MG tablet Commonly known as: FLEXERIL Take  10 mg by mouth daily.   DULoxetine 60 MG capsule Commonly known as: CYMBALTA Take 1 capsule (60 mg total) by mouth daily.   fluticasone 50 MCG/ACT nasal spray Commonly known as: FLONASE Place 1 spray into both nostrils daily.   Glycerin-Hypromellose-PEG 400 0.2-0.2-1 % Soln Place 1-2 drops into both eyes 2 (two) times daily.   hydrOXYzine 25 MG tablet Commonly known as: ATARAX Take 1 tablet  (25 mg total) by mouth 3 (three) times daily.   meclizine 25 MG tablet Commonly known as: ANTIVERT Take 25 mg by mouth 2 (two) times daily.   Nurtec 75 MG Tbdp Generic drug: Rimegepant Sulfate Take 75 mg by mouth daily as needed. What changed: reasons to take this   omeprazole 40 MG capsule Commonly known as: PRILOSEC TAKE 1 CAPSULE BY MOUTH EVERY DAY What changed: how much to take   oseltamivir 75 MG capsule Commonly known as: TAMIFLU Take 1 capsule (75 mg total) by mouth 2 (two) times daily.   traZODone 100 MG tablet Commonly known as: DESYREL Take 1 tablet (100 mg total) by mouth at bedtime.   triamcinolone cream 0.1 % Commonly known as: KENALOG Apply 1 application topically 2 (two) times daily as needed (rash).       Allergies  Allergen Reactions   Ibuprofen Other (See Comments)    Acid reflux   Ketoprofen Other (See Comments)    Muscle and stomach cramps    Topiramate Other (See Comments)    Abdominal cramps and constipation    Adhesive [Tape] Rash    Medical tape    Follow-up Information     Rometta Emery, MD Follow up in 2 week(s).   Specialty: Internal Medicine Contact information: [address] [place] [address] [phone]                  The results of significant diagnostics from this hospitalization (including imaging, microbiology, ancillary and laboratory) are listed below for reference.    Significant Diagnostic Studies: MR ANGIO HEAD WO CONTRAST  Result Date: 09/24/2021 CLINICAL DATA:  Initial evaluation for acute neuro deficit, stroke/TIA. Left-sided headache and weakness. EXAM: MRI HEAD WITHOUT CONTRAST MRA HEAD WITHOUT CONTRAST TECHNIQUE: Multiplanar, multi-echo pulse sequences of the brain and surrounding structures were acquired without intravenous contrast. Angiographic images of the Circle of Willis were acquired using MRA technique without intravenous contrast. COMPARISON:  Prior CT from 09/23/2021 FINDINGS: MRI  HEAD FINDINGS Brain: Examination technically limited by extensive susceptibility artifact from dental hardware. Cerebral volume within normal limits. No visible focal parenchymal signal abnormality. No significant cerebral white matter disease. No visible foci of restricted diffusion to suggest acute or subacute ischemia. Gray-white matter differentiation maintained. No encephalomalacia to suggest chronic cortical infarction. No visible foci of susceptibility artifact to suggest acute or chronic intracranial hemorrhage, although evaluation is somewhat limited given extensive artifact from dental hardware. No mass lesion, midline shift or mass effect. Ventricles normal size without hydrocephalus. No extra-axial fluid collection. Pituitary gland suprasellar region within normal limits. Midline structures intact. Vascular: Major intracranial vascular flow voids are maintained. Skull and upper cervical spine: Craniocervical junction within normal limits. Bone marrow signal intensity grossly within normal limits. No scalp soft tissue abnormality. Sinuses/Orbits: Globes and orbital soft tissues grossly within normal limits. Visualized paranasal sinuses are largely clear. No mastoid effusion. Other: None. MRA HEAD FINDINGS Anterior circulation: Visualized distal cervical segments of the internal carotid arteries are patent with antegrade flow. Petrous, cavernous, and supraclinoid segments patent without stenosis or  other abnormality. A1 segments patent bilaterally. Normal anterior communicating artery complex. Probable azygous ACA noted. ACA patent to its distal aspect without stenosis. Posterior circulation: Visualized V4 segments patent to the vertebrobasilar junction without stenosis. Right PICA origin patent and normal. Left PICA origin not visualized. Basilar patent to its distal aspect without stenosis. Superior cerebellar arteries patent bilaterally. Left PCA primarily supplied via the basilar, although a small left  posterior communicating arteries noted. Right PCA supplied via a hypoplastic right P1 segment and robust right posterior communicating artery. Both PCAs well perfused to their distal aspects without stenosis. Anatomic variants: Azygous ACA. No intracranial aneurysm or other vascular malformation. IMPRESSION: MRI HEAD IMPRESSION: 1. Somewhat limited exam due to extensive susceptibility artifact from dental hardware. 2. Normal brain MRI. No acute intracranial infarct or other abnormality identified. MRA HEAD IMPRESSION: 1. Normal intracranial MRA. No large vessel occlusion, hemodynamically significant stenosis, or other acute vascular abnormality. 2. Azygous ACA. Electronically Signed   By: Rise Mu M.D.   On: 09/24/2021 03:38   MR BRAIN WO CONTRAST  Result Date: 09/24/2021 CLINICAL DATA:  Initial evaluation for acute neuro deficit, stroke/TIA. Left-sided headache and weakness. EXAM: MRI HEAD WITHOUT CONTRAST MRA HEAD WITHOUT CONTRAST TECHNIQUE: Multiplanar, multi-echo pulse sequences of the brain and surrounding structures were acquired without intravenous contrast. Angiographic images of the Circle of Willis were acquired using MRA technique without intravenous contrast. COMPARISON:  Prior CT from 09/23/2021 FINDINGS: MRI HEAD FINDINGS Brain: Examination technically limited by extensive susceptibility artifact from dental hardware. Cerebral volume within normal limits. No visible focal parenchymal signal abnormality. No significant cerebral white matter disease. No visible foci of restricted diffusion to suggest acute or subacute ischemia. Gray-white matter differentiation maintained. No encephalomalacia to suggest chronic cortical infarction. No visible foci of susceptibility artifact to suggest acute or chronic intracranial hemorrhage, although evaluation is somewhat limited given extensive artifact from dental hardware. No mass lesion, midline shift or mass effect. Ventricles normal size without  hydrocephalus. No extra-axial fluid collection. Pituitary gland suprasellar region within normal limits. Midline structures intact. Vascular: Major intracranial vascular flow voids are maintained. Skull and upper cervical spine: Craniocervical junction within normal limits. Bone marrow signal intensity grossly within normal limits. No scalp soft tissue abnormality. Sinuses/Orbits: Globes and orbital soft tissues grossly within normal limits. Visualized paranasal sinuses are largely clear. No mastoid effusion. Other: None. MRA HEAD FINDINGS Anterior circulation: Visualized distal cervical segments of the internal carotid arteries are patent with antegrade flow. Petrous, cavernous, and supraclinoid segments patent without stenosis or other abnormality. A1 segments patent bilaterally. Normal anterior communicating artery complex. Probable azygous ACA noted. ACA patent to its distal aspect without stenosis. Posterior circulation: Visualized V4 segments patent to the vertebrobasilar junction without stenosis. Right PICA origin patent and normal. Left PICA origin not visualized. Basilar patent to its distal aspect without stenosis. Superior cerebellar arteries patent bilaterally. Left PCA primarily supplied via the basilar, although a small left posterior communicating arteries noted. Right PCA supplied via a hypoplastic right P1 segment and robust right posterior communicating artery. Both PCAs well perfused to their distal aspects without stenosis. Anatomic variants: Azygous ACA. No intracranial aneurysm or other vascular malformation. IMPRESSION: MRI HEAD IMPRESSION: 1. Somewhat limited exam due to extensive susceptibility artifact from dental hardware. 2. Normal brain MRI. No acute intracranial infarct or other abnormality identified. MRA HEAD IMPRESSION: 1. Normal intracranial MRA. No large vessel occlusion, hemodynamically significant stenosis, or other acute vascular abnormality. 2. Azygous ACA. Electronically  Signed   By: Sharlet Salina  Phill Myron M.D.   On: 09/24/2021 03:38   EEG adult  Result Date: 09/24/2021 Charlsie Quest, MD     09/24/2021  8:30 AM Patient Name: Dylan Guerrero MRN: [id number] Epilepsy Attending: Charlsie Quest Referring Physician/Provider: Dr Benita Gutter Date: 09/24/2021 Duration: 27.58 mins Patient history: 45 year old male with left-sided weakness and paresthesias as well as brief episode of loss of awareness.  EEG to evaluate for seizure. Level of alertness: Awake, drowsy AEDs during EEG study: None Technical aspects: This EEG study was done with scalp electrodes positioned according to the 10-20 International system of electrode placement. Electrical activity was acquired at a sampling rate of 500Hz  and reviewed with a high frequency filter of 70Hz  and a low frequency filter of 1Hz . EEG data were recorded continuously and digitally stored. Description: The posterior dominant rhythm consists of 8-9 Hz activity of moderate voltage (25-35 uV) seen predominantly in posterior head regions, symmetric and reactive to eye opening and eye closing. Drowsiness was characterized by attenuation of the posterior background rhythm. Physiologic photic driving was not seen during photic stimulation.  Hyperventilation was not performed.   IMPRESSION: This study is within normal limits. No seizures or epileptiform discharges were seen throughout the recording. Charlsie Quest   ECHOCARDIOGRAM COMPLETE BUBBLE STUDY  Result Date: 09/24/2021    ECHOCARDIOGRAM REPORT   Patient Name:   Dylan Guerrero Date of Exam: 09/24/2021 Medical Rec #:  [id number]      Height:       75.0 in Accession #:    [id number]     Weight:       277.8 lb Date of Birth:  Feb 24, 1976      BSA:          2.523 m Patient Age:    45 years       BP:           115/69 mmHg Patient Gender: M              HR:           99 bpm. Exam Location:  Inpatient Procedure: 2D Echo, Cardiac Doppler, Color Doppler and Saline Contrast Bubble            Study  Indications:    Migraines  History:        Patient has no prior history of Echocardiogram examinations.  Sonographer:    Roosvelt Maser RDCS Referring Phys: Gomez Cleverly Oree Mirelez IMPRESSIONS  1. Left ventricular ejection fraction, by estimation, is 60 to 65%. The left ventricle has normal function. The left ventricle has no regional wall motion abnormalities. Left ventricular diastolic parameters were normal.  2. Right ventricular systolic function is normal. The right ventricular size is normal.  3. The mitral valve is normal in structure. No evidence of mitral valve regurgitation. No evidence of mitral stenosis.  4. The aortic valve is normal in structure. Aortic valve regurgitation is not visualized. No aortic stenosis is present.  5. The inferior vena cava is normal in size with greater than 50% respiratory variability, suggesting right atrial pressure of 3 mmHg.  6. Agitated saline contrast bubble study was negative, with no evidence of any interatrial shunt. FINDINGS  Left Ventricle: Left ventricular ejection fraction, by estimation, is 60 to 65%. The left ventricle has normal function. The left ventricle has no regional wall motion abnormalities. The left ventricular internal cavity size was normal in size. There is  no left ventricular hypertrophy. Left ventricular diastolic parameters were normal. Right  Ventricle: The right ventricular size is normal. No increase in right ventricular wall thickness. Right ventricular systolic function is normal. Left Atrium: Left atrial size was normal in size. Right Atrium: Right atrial size was normal in size. Pericardium: There is no evidence of pericardial effusion. Mitral Valve: The mitral valve is normal in structure. No evidence of mitral valve regurgitation. No evidence of mitral valve stenosis. Tricuspid Valve: The tricuspid valve is normal in structure. Tricuspid valve regurgitation is not demonstrated. No evidence of tricuspid stenosis. Aortic Valve: The aortic valve is  normal in structure. Aortic valve regurgitation is not visualized. No aortic stenosis is present. Aortic valve mean gradient measures 3.0 mmHg. Aortic valve peak gradient measures 5.6 mmHg. Aortic valve area, by VTI measures 2.79 cm. Pulmonic Valve: The pulmonic valve was normal in structure. Pulmonic valve regurgitation is not visualized. No evidence of pulmonic stenosis. Aorta: The aortic root is normal in size and structure. Venous: The inferior vena cava is normal in size with greater than 50% respiratory variability, suggesting right atrial pressure of 3 mmHg. IAS/Shunts: No atrial level shunt detected by color flow Doppler. Agitated saline contrast was given intravenously to evaluate for intracardiac shunting. Agitated saline contrast bubble study was negative, with no evidence of any interatrial shunt.  LEFT VENTRICLE PLAX 2D LVIDd:         4.20 cm   Diastology LVIDs:         3.00 cm   LV e' medial:    8.38 cm/s LV PW:         0.90 cm   LV E/e' medial:  6.3 LV IVS:        0.90 cm   LV e' lateral:   11.00 cm/s LVOT diam:     2.00 cm   LV E/e' lateral: 4.8 LV SV:         55 LV SV Index:   22 LVOT Area:     3.14 cm  RIGHT VENTRICLE RV Basal diam:  3.20 cm LEFT ATRIUM             Index        RIGHT ATRIUM           Index LA diam:        3.40 cm 1.35 cm/m   RA Area:     15.90 cm LA Vol (A2C):   54.2 ml 21.48 ml/m  RA Volume:   40.50 ml  16.05 ml/m LA Vol (A4C):   32.5 ml 12.88 ml/m LA Biplane Vol: 44.1 ml 17.48 ml/m  AORTIC VALVE AV Area (Vmax):    2.96 cm AV Area (Vmean):   2.75 cm AV Area (VTI):     2.79 cm AV Vmax:           118.00 cm/s AV Vmean:          83.300 cm/s AV VTI:            0.197 m AV Peak Grad:      5.6 mmHg AV Mean Grad:      3.0 mmHg LVOT Vmax:         111.00 cm/s LVOT Vmean:        72.900 cm/s LVOT VTI:          0.175 m LVOT/AV VTI ratio: 0.89  AORTA Ao Root diam: 3.10 cm Ao Asc diam:  2.80 cm MITRAL VALVE MV Area (PHT): 4.71 cm    SHUNTS MV Decel Time: 161 msec    Systemic VTI:  0.18 m MV E velocity: 52.70 cm/s  Systemic Diam: 2.00 cm MV A velocity: 58.70 cm/s MV E/A ratio:  0.90 Mihai Croitoru MD Electronically signed by Thurmon Fair MD Signature Date/Time: 09/24/2021/2:42:15 PM    Final    CT HEAD CODE STROKE WO CONTRAST  Result Date: 09/23/2021 CLINICAL DATA:  Code stroke. Acute neuro deficit. Left-sided weakness EXAM: CT HEAD WITHOUT CONTRAST TECHNIQUE: Contiguous axial images were obtained from the base of the skull through the vertex without intravenous contrast. COMPARISON:  CT head 05/27/2017 FINDINGS: Brain: No evidence of acute infarction, hemorrhage, hydrocephalus, extra-axial collection or mass lesion/mass effect. Vascular: Negative for hyperdense vessel Skull: Negative Sinuses/Orbits: Paranasal sinuses clear.  Negative orbit Other: None ASPECTS ([place]berta Stroke Program Early CT Score) - Ganglionic level infarction (caudate, lentiform nuclei, internal capsule, insula, M1-M3 cortex): 7 - Supraganglionic infarction (M4-M6 cortex): 3 Total score (0-10 with 10 being normal): 10 IMPRESSION: 1. Negative CT head 2. ASPECTS is 10 3. Code stroke imaging results were communicated on 09/23/2021 at 8:14 pm to provider Derry Lory via text page Electronically Signed   By: Marlan Palau M.D.   On: 09/23/2021 20:20   VAS US CAROTID (at [facility] and [facility] only)  Result Date: 09/24/2021 Carotid Arterial Duplex Study Patient Name:  Dylan Guerrero  Date of Exam:   09/24/2021 Medical Rec #: [id number]       Accession #:    [id number] Date of Birth: 1975-12-04       Patient Gender: M Patient Age:   27 years Exam Location:  [facility] Procedure:      VAS US CAROTID Referring Phys: CHING TU --------------------------------------------------------------------------------  Indications:       TIA and Weakness. Risk Factors:      Hypertension. Limitations        Today's exam was limited due to the body habitus of the                    patient. Comparison Study:  No prior studies. Performing  Technologist: Jean Rosenthal RDMS, RVT  Examination Guidelines: A complete evaluation includes B-mode imaging, spectral Doppler, color Doppler, and power Doppler as needed of all accessible portions of each vessel. Bilateral testing is considered an integral part of a complete examination. Limited examinations for reoccurring indications may be performed as noted.  Right Carotid Findings: +----------+--------+--------+--------+------------------+------------------+             PSV cm/s EDV cm/s Stenosis Plaque Description Comments            +----------+--------+--------+--------+------------------+------------------+  CCA Prox   114      19                                                       +----------+--------+--------+--------+------------------+------------------+  CCA Distal 58       16                                                       +----------+--------+--------+--------+------------------+------------------+  ICA Prox   79       22  intimal thickening  +----------+--------+--------+--------+------------------+------------------+  ICA Distal 75       30                                                       +----------+--------+--------+--------+------------------+------------------+  ECA        78       15                                                       +----------+--------+--------+--------+------------------+------------------+ +----------+--------+-------+----------------+-------------------+             PSV cm/s EDV cms Describe         Arm Pressure (mmHG)  +----------+--------+-------+----------------+-------------------+  Subclavian 181              Multiphasic, WNL                      +----------+--------+-------+----------------+-------------------+ +---------+--------+--+--------+-+---------+  Vertebral PSV cm/s 19 EDV cm/s 7 Antegrade  +---------+--------+--+--------+-+---------+  Left Carotid Findings:  +----------+--------+--------+--------+------------------+------------------+             PSV cm/s EDV cm/s Stenosis Plaque Description Comments            +----------+--------+--------+--------+------------------+------------------+  CCA Prox   111      18                                                       +----------+--------+--------+--------+------------------+------------------+  CCA Distal 72       20                                                       +----------+--------+--------+--------+------------------+------------------+  ICA Prox   43       18                                   intimal thickening  +----------+--------+--------+--------+------------------+------------------+  ICA Distal 77       33                                                       +----------+--------+--------+--------+------------------+------------------+  ECA        81       20                                                       +----------+--------+--------+--------+------------------+------------------+ +----------+--------+--------+----------------+-------------------+             PSV cm/s  EDV cm/s Describe         Arm Pressure (mmHG)  +----------+--------+--------+----------------+-------------------+  Subclavian 125               Multiphasic, WNL                      +----------+--------+--------+----------------+-------------------+ +---------+--------+--+--------+--+---------+  Vertebral PSV cm/s 44 EDV cm/s 14 Antegrade  +---------+--------+--+--------+--+---------+   Summary: Right Carotid: The extracranial vessels were near-normal with only minimal wall                thickening or plaque. Left Carotid: The extracranial vessels were near-normal with only minimal wall               thickening or plaque. Vertebrals:  Bilateral vertebral arteries demonstrate antegrade flow. Subclavians: Normal flow hemodynamics were seen in bilateral subclavian              arteries. *See table(s) above for measurements and  observations.     Preliminary     Microbiology: Recent Results (from the past 240 hour(s))  Resp Panel by RT-PCR (Flu A&B, Covid) Nasopharyngeal Swab     Status: Abnormal   Collection Time: 09/23/21 11:43 PM   Specimen: Nasopharyngeal Swab; Nasopharyngeal(NP) swabs in vial transport medium  Result Value Ref Range Status   SARS Coronavirus 2 by RT PCR NEGATIVE NEGATIVE Final    Comment: (NOTE) SARS-CoV-2 target nucleic acids are NOT DETECTED.  The SARS-CoV-2 RNA is generally detectable in upper respiratory specimens during the acute phase of infection. The lowest concentration of SARS-CoV-2 viral copies this assay can detect is 138 copies/mL. A negative result does not preclude SARS-Cov-2 infection and should not be used as the sole basis for treatment or other patient management decisions. A negative result may occur with  improper specimen collection/handling, submission of specimen other than nasopharyngeal swab, presence of viral mutation(s) within the areas targeted by this assay, and inadequate number of viral copies(<138 copies/mL). A negative result must be combined with clinical observations, patient history, and epidemiological information. The expected result is Negative.  Fact Sheet for Patients:  BloggerCourse.com  Fact Sheet for Healthcare Providers:  SeriousBroker.it  This test is no t yet approved or cleared by the [place] FDA and  has been authorized for detection and/or diagnosis of SARS-CoV-2 by FDA under an Emergency Use Authorization (EUA). This EUA will remain  in effect (meaning this test can be used) for the duration of the COVID-19 declaration under Section 564(b)(1) of the Act, 21 U.S.C.section 360bbb-3(b)(1), unless the authorization is terminated  or revoked sooner.       Influenza A by PCR POSITIVE (A) NEGATIVE Final   Influenza B by PCR NEGATIVE NEGATIVE Final    Comment: (NOTE) The Xpert  Xpress SARS-CoV-2/FLU/RSV plus assay is intended as an aid in the diagnosis of influenza from Nasopharyngeal swab specimens and should not be used as a sole basis for treatment. Nasal washings and aspirates are unacceptable for Xpert Xpress SARS-CoV-2/FLU/RSV testing.  Fact Sheet for Patients: BloggerCourse.com  Fact Sheet for Healthcare Providers: SeriousBroker.it  This test is not yet approved or cleared by the [place] FDA and has been authorized for detection and/or diagnosis of SARS-CoV-2 by FDA under an Emergency Use Authorization (EUA). This EUA will remain in effect (meaning this test can be used) for the duration of the COVID-19 declaration under Section 564(b)(1) of the Act, 21 U.S.C. section 360bbb-3(b)(1), unless the authorization is terminated or revoked.  Performed at [facility]  [facility], [address], [place], [place] [address]      Labs: Basic Metabolic Panel: Recent Labs  Lab 09/23/21 1958 09/24/21 1642  NA 131*  --   K 3.2* 4.0  CL 92*  --   CO2 28  --   GLUCOSE 98  --   BUN 11  --   CREATININE 1.34*  --   CALCIUM 9.0  --    Liver Function Tests: Recent Labs  Lab 09/23/21 1958  AST 27  ALT 24  ALKPHOS 97  BILITOT 0.6  PROT 7.4  ALBUMIN 4.0     CBG: Recent Labs  Lab 09/23/21 1959  GLUCAP 108*       Signed:  Meredeth Ide MD.  [facility] 09/25/2021, 10:33 AM

## 2021-09-24 NOTE — Progress Notes (Signed)
SLP Cancellation Note  Patient Details Name: Dylan Guerrero MRN: [id number] DOB: 1976/07/24   Cancelled treatment:       Reason Eval/Treat Not Completed: SLP screened, no needs identified, will sign off  Pts MRI negative, passed [facility]. No persistent reported deficits for SLP to address Stasha Naraine, Riley Nearing 09/24/2021, 7:55 AM

## 2021-09-24 NOTE — Procedures (Signed)
Patient Name: Dylan Guerrero  MRN: [id number]  Epilepsy Attending: Charlsie Quest  Referring Physician/Provider: Dr Benita Gutter Date: 09/24/2021 Duration: 27.58 mins  Patient history: 45 year old male with left-sided weakness and paresthesias as well as brief episode of loss of awareness.  EEG to evaluate for seizure.  Level of alertness: Awake, drowsy  AEDs during EEG study: None  Technical aspects: This EEG study was done with scalp electrodes positioned according to the 10-20 International system of electrode placement. Electrical activity was acquired at a sampling rate of 500Hz  and reviewed with a high frequency filter of 70Hz  and a low frequency filter of 1Hz . EEG data were recorded continuously and digitally stored.   Description: The posterior dominant rhythm consists of 8-9 Hz activity of moderate voltage (25-35 uV) seen predominantly in posterior head regions, symmetric and reactive to eye opening and eye closing. Drowsiness was characterized by attenuation of the posterior background rhythm. Physiologic photic driving was not seen during photic stimulation.  Hyperventilation was not performed.     IMPRESSION: This study is within normal limits. No seizures or epileptiform discharges were seen throughout the recording.  Dylan Guerrero 

## 2021-09-24 NOTE — Progress Notes (Signed)
STROKE TEAM PROGRESS NOTE   INTERVAL HISTORY Patient is resting in bed comfortably. He is hemodynamically stable and is able to give a clear and coherent history of the event leading up to his hospitalization. He states that he noticed "halos" in his vision in the morning of 09/23/2021. He then had a strong headache and numbness started at his face and then down his right arm. MRI showed no stroke. He does have a history of migraines, last migraine in November 2022.  He states his main complaint now is headache and is numbness and visual symptoms have resolved.  MRI scan of the brain is negative for acute stroke MR angiogram of the brain shows no large vessel stenosis or occlusion.  LDL cholesterol 48 mg percent.  Hemoglobin A1c is 5.9.  EEG is normal  Vitals:   09/24/21 0800 09/24/21 0930 09/24/21 1000 09/24/21 1030  BP: 130/86 108/70 120/67 112/65  Pulse: 96 (!) 106 (!) 105 (!) 105  Resp: (!) 23 (!) 26 (!) 25 (!) 26  Temp:      TempSrc:      SpO2: 97% 96% 94% 96%  Weight:      Height:       CBC: No results for input(s): WBC, NEUTROABS, HGB, HCT, MCV, PLT in the last 168 hours. Basic Metabolic Panel:  Recent Labs  Lab 09/23/21 1958  NA 131*  K 3.2*  CL 92*  CO2 28  GLUCOSE 98  BUN 11  CREATININE 1.34*  CALCIUM 9.0   Lipid Panel:  Recent Labs  Lab 09/24/21 0245  CHOL 124  TRIG 104  HDL 55  CHOLHDL 2.3  VLDL 21  LDLCALC 48   HgbA1c:  Recent Labs  Lab 09/24/21 0245  HGBA1C 5.9*   Urine Drug Screen:  Recent Labs  Lab 09/24/21 0800  LABOPIA POSITIVE*  COCAINSCRNUR NONE DETECTED  LABBENZ NONE DETECTED  AMPHETMU NONE DETECTED  THCU NONE DETECTED  LABBARB NONE DETECTED    Alcohol Level No results for input(s): ETH in the last 168 hours.  IMAGING past 24 hours MR ANGIO HEAD WO CONTRAST  Result Date: 09/24/2021 CLINICAL DATA:  Initial evaluation for acute neuro deficit, stroke/TIA. Left-sided headache and weakness. EXAM: MRI HEAD WITHOUT CONTRAST MRA HEAD  WITHOUT CONTRAST TECHNIQUE: Multiplanar, multi-echo pulse sequences of the brain and surrounding structures were acquired without intravenous contrast. Angiographic images of the Circle of Willis were acquired using MRA technique without intravenous contrast. COMPARISON:  Prior CT from 09/23/2021 FINDINGS: MRI HEAD FINDINGS Brain: Examination technically limited by extensive susceptibility artifact from dental hardware. Cerebral volume within normal limits. No visible focal parenchymal signal abnormality. No significant cerebral white matter disease. No visible foci of restricted diffusion to suggest acute or subacute ischemia. Gray-white matter differentiation maintained. No encephalomalacia to suggest chronic cortical infarction. No visible foci of susceptibility artifact to suggest acute or chronic intracranial hemorrhage, although evaluation is somewhat limited given extensive artifact from dental hardware. No mass lesion, midline shift or mass effect. Ventricles normal size without hydrocephalus. No extra-axial fluid collection. Pituitary gland suprasellar region within normal limits. Midline structures intact. Vascular: Major intracranial vascular flow voids are maintained. Skull and upper cervical spine: Craniocervical junction within normal limits. Bone marrow signal intensity grossly within normal limits. No scalp soft tissue abnormality. Sinuses/Orbits: Globes and orbital soft tissues grossly within normal limits. Visualized paranasal sinuses are largely clear. No mastoid effusion. Other: None. MRA HEAD FINDINGS Anterior circulation: Visualized distal cervical segments of the internal carotid arteries are patent with  antegrade flow. Petrous, cavernous, and supraclinoid segments patent without stenosis or other abnormality. A1 segments patent bilaterally. Normal anterior communicating artery complex. Probable azygous ACA noted. ACA patent to its distal aspect without stenosis. Posterior circulation:  Visualized V4 segments patent to the vertebrobasilar junction without stenosis. Right PICA origin patent and normal. Left PICA origin not visualized. Basilar patent to its distal aspect without stenosis. Superior cerebellar arteries patent bilaterally. Left PCA primarily supplied via the basilar, although a small left posterior communicating arteries noted. Right PCA supplied via a hypoplastic right P1 segment and robust right posterior communicating artery. Both PCAs well perfused to their distal aspects without stenosis. Anatomic variants: Azygous ACA. No intracranial aneurysm or other vascular malformation. IMPRESSION: MRI HEAD IMPRESSION: 1. Somewhat limited exam due to extensive susceptibility artifact from dental hardware. 2. Normal brain MRI. No acute intracranial infarct or other abnormality identified. MRA HEAD IMPRESSION: 1. Normal intracranial MRA. No large vessel occlusion, hemodynamically significant stenosis, or other acute vascular abnormality. 2. Azygous ACA. Electronically Signed   By: Rise Mu M.D.   On: 09/24/2021 03:38   MR BRAIN WO CONTRAST  Result Date: 09/24/2021 CLINICAL DATA:  Initial evaluation for acute neuro deficit, stroke/TIA. Left-sided headache and weakness. EXAM: MRI HEAD WITHOUT CONTRAST MRA HEAD WITHOUT CONTRAST TECHNIQUE: Multiplanar, multi-echo pulse sequences of the brain and surrounding structures were acquired without intravenous contrast. Angiographic images of the Circle of Willis were acquired using MRA technique without intravenous contrast. COMPARISON:  Prior CT from 09/23/2021 FINDINGS: MRI HEAD FINDINGS Brain: Examination technically limited by extensive susceptibility artifact from dental hardware. Cerebral volume within normal limits. No visible focal parenchymal signal abnormality. No significant cerebral white matter disease. No visible foci of restricted diffusion to suggest acute or subacute ischemia. Gray-white matter differentiation maintained.  No encephalomalacia to suggest chronic cortical infarction. No visible foci of susceptibility artifact to suggest acute or chronic intracranial hemorrhage, although evaluation is somewhat limited given extensive artifact from dental hardware. No mass lesion, midline shift or mass effect. Ventricles normal size without hydrocephalus. No extra-axial fluid collection. Pituitary gland suprasellar region within normal limits. Midline structures intact. Vascular: Major intracranial vascular flow voids are maintained. Skull and upper cervical spine: Craniocervical junction within normal limits. Bone marrow signal intensity grossly within normal limits. No scalp soft tissue abnormality. Sinuses/Orbits: Globes and orbital soft tissues grossly within normal limits. Visualized paranasal sinuses are largely clear. No mastoid effusion. Other: None. MRA HEAD FINDINGS Anterior circulation: Visualized distal cervical segments of the internal carotid arteries are patent with antegrade flow. Petrous, cavernous, and supraclinoid segments patent without stenosis or other abnormality. A1 segments patent bilaterally. Normal anterior communicating artery complex. Probable azygous ACA noted. ACA patent to its distal aspect without stenosis. Posterior circulation: Visualized V4 segments patent to the vertebrobasilar junction without stenosis. Right PICA origin patent and normal. Left PICA origin not visualized. Basilar patent to its distal aspect without stenosis. Superior cerebellar arteries patent bilaterally. Left PCA primarily supplied via the basilar, although a small left posterior communicating arteries noted. Right PCA supplied via a hypoplastic right P1 segment and robust right posterior communicating artery. Both PCAs well perfused to their distal aspects without stenosis. Anatomic variants: Azygous ACA. No intracranial aneurysm or other vascular malformation. IMPRESSION: MRI HEAD IMPRESSION: 1. Somewhat limited exam due to  extensive susceptibility artifact from dental hardware. 2. Normal brain MRI. No acute intracranial infarct or other abnormality identified. MRA HEAD IMPRESSION: 1. Normal intracranial MRA. No large vessel occlusion, hemodynamically significant stenosis, or other acute vascular  abnormality. 2. Azygous ACA. Electronically Signed   By: Rise Mu M.D.   On: 09/24/2021 03:38   EEG adult  Result Date: 09/24/2021 Charlsie Quest, MD     09/24/2021  8:30 AM Patient Name: Dylan Guerrero MRN: [id number] Epilepsy Attending: Charlsie Quest Referring Physician/Provider: Dr Benita Gutter Date: 09/24/2021 Duration: 27.58 mins Patient history: 45 year old male with left-sided weakness and paresthesias as well as brief episode of loss of awareness.  EEG to evaluate for seizure. Level of alertness: Awake, drowsy AEDs during EEG study: None Technical aspects: This EEG study was done with scalp electrodes positioned according to the 10-20 International system of electrode placement. Electrical activity was acquired at a sampling rate of  and reviewed with a high frequency filter of  and a low frequency filter of . EEG data were recorded continuously and digitally stored. Description: The posterior dominant rhythm consists of 8-9 Hz activity of moderate voltage (25-35 uV) seen predominantly in posterior head regions, symmetric and reactive to eye opening and eye closing. Drowsiness was characterized by attenuation of the posterior background rhythm. Physiologic photic driving was not seen during photic stimulation.  Hyperventilation was not performed.   IMPRESSION: This study is within normal limits. No seizures or epileptiform discharges were seen throughout the recording. Charlsie Quest   CT HEAD CODE STROKE WO CONTRAST  Result Date: 09/23/2021 CLINICAL DATA:  Code stroke. Acute neuro deficit. Left-sided weakness EXAM: CT HEAD WITHOUT CONTRAST TECHNIQUE: Contiguous axial images were obtained from the  base of the skull through the vertex without intravenous contrast. COMPARISON:  CT head 05/27/2017 FINDINGS: Brain: No evidence of acute infarction, hemorrhage, hydrocephalus, extra-axial collection or mass lesion/mass effect. Vascular: Negative for hyperdense vessel Skull: Negative Sinuses/Orbits: Paranasal sinuses clear.  Negative orbit Other: None ASPECTS ([place] Stroke Program Early CT Score) - Ganglionic level infarction (caudate, lentiform nuclei, internal capsule, insula, M1-M3 cortex): 7 - Supraganglionic infarction (M4-M6 cortex): 3 Total score (0-10 with 10 being normal): 10 IMPRESSION: 1. Negative CT head 2. ASPECTS is 10 3. Code stroke imaging results were communicated on 09/23/2021 at 8:14 pm to provider Derry Lory via text page Electronically Signed   By: Marlan Palau M.D.   On: 09/23/2021 20:20   VAS US CAROTID (at [facility] and [facility] only)  Result Date: 09/24/2021 Carotid Arterial Duplex Study Patient Name:  PERNELL LENOIR  Date of Exam:   09/24/2021 Medical Rec #: [id number]       Accession #:    [id number] Date of Birth: 09-Jul-1976       Patient Gender: M Patient Age:   60 years Exam Location:  [facility] Procedure:      VAS US CAROTID Referring Phys: CHING TU --------------------------------------------------------------------------------  Indications:       TIA and Weakness. Risk Factors:      Hypertension. Limitations        Today's exam was limited due to the body habitus of the                    patient. Comparison Study:  No prior studies. Performing Technologist: Jean Rosenthal RDMS, RVT  Examination Guidelines: A complete evaluation includes B-mode imaging, spectral Doppler, color Doppler, and power Doppler as needed of all accessible portions of each vessel. Bilateral testing is considered an integral part of a complete examination. Limited examinations for reoccurring indications may be performed as noted.  Right Carotid Findings:  +----------+--------+--------+--------+------------------+------------------+  PSV cm/s EDV cm/s Stenosis Plaque Description Comments            +----------+--------+--------+--------+------------------+------------------+  CCA Prox   114      19                                                       +----------+--------+--------+--------+------------------+------------------+  CCA Distal 58       16                                                       +----------+--------+--------+--------+------------------+------------------+  ICA Prox   79       22                                   intimal thickening  +----------+--------+--------+--------+------------------+------------------+  ICA Distal 75       30                                                       +----------+--------+--------+--------+------------------+------------------+  ECA        78       15                                                       +----------+--------+--------+--------+------------------+------------------+ +----------+--------+-------+----------------+-------------------+             PSV cm/s EDV cms Describe         Arm Pressure (mmHG)  +----------+--------+-------+----------------+-------------------+  Subclavian 181              Multiphasic, WNL                      +----------+--------+-------+----------------+-------------------+ +---------+--------+--+--------+-+---------+  Vertebral PSV cm/s 19 EDV cm/s 7 Antegrade  +---------+--------+--+--------+-+---------+  Left Carotid Findings: +----------+--------+--------+--------+------------------+------------------+             PSV cm/s EDV cm/s Stenosis Plaque Description Comments            +----------+--------+--------+--------+------------------+------------------+  CCA Prox   111      18                                                       +----------+--------+--------+--------+------------------+------------------+  CCA Distal 72       20                                                        +----------+--------+--------+--------+------------------+------------------+  ICA Prox  43       18                                   intimal thickening  +----------+--------+--------+--------+------------------+------------------+  ICA Distal 77       33                                                       +----------+--------+--------+--------+------------------+------------------+  ECA        81       20                                                       +----------+--------+--------+--------+------------------+------------------+ +----------+--------+--------+----------------+-------------------+             PSV cm/s EDV cm/s Describe         Arm Pressure (mmHG)  +----------+--------+--------+----------------+-------------------+  Subclavian 125               Multiphasic, WNL                      +----------+--------+--------+----------------+-------------------+ +---------+--------+--+--------+--+---------+  Vertebral PSV cm/s 44 EDV cm/s 14 Antegrade  +---------+--------+--+--------+--+---------+   Summary: Right Carotid: The extracranial vessels were near-normal with only minimal wall                thickening or plaque. Left Carotid: The extracranial vessels were near-normal with only minimal wall               thickening or plaque. Vertebrals:  Bilateral vertebral arteries demonstrate antegrade flow. Subclavians: Normal flow hemodynamics were seen in bilateral subclavian              arteries. *See table(s) above for measurements and observations.     Preliminary     PHYSICAL EXAM Obese middle-aged African-American male not in distress. . Afebrile. Head is nontraumatic. Neck is supple without bruit.    Cardiac exam no murmur or gallop. Lungs are clear to auscultation. Distal pulses are well felt.  Neurological Exam ;  Awake  Alert oriented x 3. Normal speech and language.eye movements full without nystagmus.fundi were not visualized. Vision acuity and fields appear  normal. Hearing is normal. Palatal movements are normal. Face symmetric. Tongue midline. Normal strength, tone, reflexes and coordination. Normal sensation. Gait deferred.  ASSESSMENT/PLAN Dylan Guerrero is a 45 y.o. male with history of GERD, HTN, HLD, migraine, OSA presenting following a a brief episode of loss of awareness for a few seconds followed by sharp left temple headache and then left sided weakness and numbness.   Transient left-sided numbness and visual dysfunction in the setting of headaches with negative neuroimaging likely complicated migraine (no stroke) Code Stroke CT head No acute abnormality. ASPECTS 10.    MRI  Unremarkable  MRA  Unremarkable  Carotid Doppler  B ICA 1-39% stenosis, VAs antegrade  2D Echo EF 60-65%. No source of embolus, neg shunt LDL 48 HgbA1c 5.9 Toradol 30 and zofran x 1  Add topamax 25 bid Ok for discharge from stroke standpoint Stroke team will sign off  Hypertension Long-term BP  goal normotensive  Hospital day # 0  I have personally obtained history,examined this patient, reviewed notes, independently viewed imaging studies, participated in medical decision making and plan of care.ROS completed by me personally and pertinent positives fully documented  I have made any additions or clarifications directly to the above note.  Patient presented with transient left-sided numbness and visual dysfunction along with headaches likely a complicated migraine episode with negative neurovascular studies.  Recommend treat acute headache with Toradol and continue home medication regimen of Nurtec and Avogy injections and follow-up with neurologist Dr. Marjory Lies.  Greater than 50% time during this 35-minute visit were spent on counseling and coordination of care and discussion patient and care team and answering questions.  Discussed with Dr. Sharl Ma.  Stroke team will sign off.  Kindly call for questions Delia Heady, MD Medical Director [facility] Pager: [phone] 09/24/2021 9:43 PM    To contact Stroke Continuity provider, please refer to [email or web address]. After hours, contact General Neurology

## 2021-09-24 NOTE — Progress Notes (Signed)
EEG complete - results pending 

## 2021-09-24 NOTE — Progress Notes (Signed)
°  Echocardiogram 2D Echocardiogram has been performed.  Dylan Guerrero F 09/24/2021, 11:37 AM

## 2021-09-24 NOTE — H&P (Signed)
History and Physical    Dylan Guerrero:[id number] DOB: 19-Jun-1976 DOA: 09/23/2021  PCP: Rometta Emery, MD  Patient coming from: Home  I have personally briefly reviewed patient's old medical records in [facility]  Chief Complaint: left sided weakness, headache, LOC  HPI: Dylan Guerrero is a 45 y.o. male with medical history significant for migraine, hypertension, anxiety, PTSD, asthma, sleep apnea, and GERD who presents with an episode of loss of consciousness with left-sided weakness.  Patient lives in [place] but receives all of his medical care in [place].  He is here to visit his brother for a get away due to some increasing family stress.  He was watching TV earlier today when brother reports that he suddenly "froze" for about 5 seconds.  He then came to and had pressure like left-sided headache with left-sided weakness and paresthesia.Also seeing floaters and aura.  He has chronic migraine but usually it is on the right side.  He reports about 3 weeks of increasing bilateral upper extremity and abdominal tremor that his primary physician thinks could be due to anxiety.  He reports he is tremor at rest and has also woken him up from his sleep. He is on Nurtec and monthly IV Ajovy injecton and has not had any changes in his medication. He denies tobacco use.  Has occasional wine use.  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  ED Course: He initially presented as a code stroke but no TNKase was given due to resolving symptoms, no tPA due low suspicion for LVO.  CT head negative.   He was otherwise afebrile, tachycardic with heart rate 120s, tachypneic and normotensive on room air. CBC pending.  Sodium of 131, K of 3.2, creatinine of 1.34, BG of 98  Neurology has evaluated bedside and recommends full stroke and seizure workup although suspects symptoms more hemiplegic migraine vs TIA.    Review of  Systems: Constitutional: No Weight Change, No Fever ENT/Mouth: No sore throat, No Rhinorrhea Eyes:+Vision Changes Cardiovascular: No Chest Pain, no SOB Respiratory: No Cough,  Gastrointestinal: No Nausea, No Vomiting Genitourinary: no Urinary Incontinence Musculoskeletal: No Arthralgias, No Myalgias Skin: No Skin Lesions, No Pruritus, Neuro: + Weakness, +Numbness,  + Loss of Consciousness, No Syncope Psych: No Anxiety/Panic, No Depression, no decrease appetite Heme/Lymph: No Bruising, No Bleeding  Past Medical History:  Diagnosis Date   Abdominal pain    Allergy    Anxiety    Arthritis    Carpal tunnel syndrome    Depression    GERD (gastroesophageal reflux disease)    Headache(784.0)    Hernia    Hypertension    Hypokalemia    Infection of the inner ear    Migraine    Sinus infection    Sleep apnea    Spider bite     Past Surgical History:  Procedure Laterality Date   APPENDECTOMY  1984   HERNIA REPAIR  01/30/2011   Right Inguinal Hernia repair with mesh   HERNIA REPAIR  01/30/2011   Umbilical Hernia repair with meash     reports that he has never smoked. He has never used smokeless tobacco. He reports that he does not drink alcohol and does not use drugs. Social History  Allergies  Allergen Reactions   Ibuprofen Other (See Comments)    Acid reflux   Ketoprofen Other (See Comments)    Muscle and stomach cramps    Topiramate Other (See  Comments)    Abdominal cramps and constipation    Adhesive [Tape] Rash    Medical tape    Family History  Problem Relation Age of Onset   Scleroderma Mother    Depression Father    Anxiety disorder Father    Diabetes Other    Hypertension Other    Dementia Paternal Grandfather    ADD / ADHD Other      Prior to Admission medications   Medication Sig Start Date End Date Taking? Authorizing Provider  albuterol (PROVENTIL HFA;VENTOLIN HFA) 108 (90 Base) MCG/ACT inhaler Inhale 2 puffs into the lungs every 6 (six)  hours as needed for wheezing or shortness of breath.    [provider]  amLODipine (NORVASC) 10 MG tablet Take 10 mg by mouth daily.    [provider]  bisoprolol (ZEBETA) 5 MG tablet TAKE 1 TABLET BY MOUTH EVERY DAY 06/20/18   Nyoka Cowden, MD  colchicine 0.6 MG tablet colchicine 0.6 mg tablet    Rometta Emery, MD  DULoxetine (CYMBALTA) 60 MG capsule Take 1 capsule (60 mg total) by mouth daily. 05/21/21 06/20/21  Arfeen, Phillips Grout, MD  fluticasone (FLONASE) 50 MCG/ACT nasal spray Place 1 spray into both nostrils daily as needed for allergies or rhinitis.     [provider]  Fremanezumab-vfrm (AJOVY) 225 MG/1.5ML SOAJ Inject 225 mg into the skin every 30 (thirty) days. 11/25/20   Penumalli, Glenford Bayley, MD  Glycerin-Hypromellose-PEG 400 0.2-0.2-1 % SOLN Place 1-2 drops into both eyes 2 (two) times daily.    [provider]  hydrochlorothiazide (HYDRODIURIL) 25 MG tablet Take 25 mg by mouth daily.    [provider]  hydrocortisone cream 1 % Apply 1 application topically 2 (two) times daily as needed for itching (RASH). Patient not taking: Reported on 11/25/2020    [provider]  hydrOXYzine (ATARAX/VISTARIL) 25 MG tablet Take 1 tablet (25 mg total) by mouth 3 (three) times daily. 05/21/21   Arfeen, Phillips Grout, MD  linaclotide Karlene Einstein) 145 MCG CAPS capsule Take 145 mcg by mouth daily. Patient not taking: Reported on 11/25/2020    [provider]  loratadine (CLARITIN) 10 MG tablet Take 10 mg by mouth daily.    [provider]  meclizine (ANTIVERT) 25 MG tablet Take 25 mg by mouth 2 (two) times daily.    [provider]  omeprazole (PRILOSEC) 40 MG capsule TAKE 1 CAPSULE BY MOUTH EVERY DAY Patient taking differently: Take 40 mg by mouth daily. 05/16/18   Nyoka Cowden, MD  Rimegepant Sulfate (NURTEC) 75 MG TBDP Take 75 mg by mouth daily as needed. 11/25/20   Penumalli, Glenford Bayley, MD  traZODone (DESYREL) 100 MG tablet Take 1  tablet (100 mg total) by mouth at bedtime. 05/21/21   Arfeen, Phillips Grout, MD  triamcinolone cream (KENALOG) 0.1 % Apply 1 application topically as needed (for rashes).  04/01/18   [provider]  UNABLE TO FIND Med Name: albuterol .021%, (25 x 3 mL) solution for nebulizer    [provider]    Physical Exam: Vitals:   09/23/21 2215 09/23/21 2230 09/23/21 2330 09/24/21 0100  BP: 133/86 (!) 156/87 117/82 122/82  Pulse: (!) 111 (!) 123  (!) 103  Resp: (!) 31 (!) 25 (!) 23 (!) 22  Temp:    99.1 F (37.3 C)  TempSrc:    Temporal  SpO2: 100% 97%  97%  Weight:      Height:  Constitutional: NAD, calm, comfortable, middle-age male lying flat in bed Vitals:   09/23/21 2215 09/23/21 2230 09/23/21 2330 09/24/21 0100  BP: 133/86 (!) 156/87 117/82 122/82  Pulse: (!) 111 (!) 123  (!) 103  Resp: (!) 31 (!) 25 (!) 23 (!) 22  Temp:    99.1 F (37.3 C)  TempSrc:    Temporal  SpO2: 100% 97%  97%  Weight:      Height:       Eyes: PERRL, lids and conjunctivae normal ENMT: Mucous membranes are moist.  Neck: normal, supple, Respiratory: clear to auscultation bilaterally, no wheezing, no crackles. Normal respiratory effort.  Cardiovascular: Regular rate and rhythm, no murmurs / rubs / gallops. No extremity edema.  Abdomen: no tenderness, no masses palpated.  Bowel sounds positive.  Musculoskeletal: no clubbing / cyanosis. No joint deformity upper and lower extremities. Good ROM, no contractures. Normal muscle tone.  Skin: no rashes, lesions, ulcers. No induration Neurologic: CN 2-12 grossly intact.  Decreased sensation to left side of the face, upper extremity and lower extremity.  Strength 4 out of 5 to the lower extremity.   Psychiatric: Normal judgment and insight. Alert and oriented x 3.  Tearful mood when plan for admission was discussed.     Labs on Admission: I have personally reviewed following labs and imaging studies  CBC: No results for input(s): WBC, NEUTROABS,  HGB, HCT, MCV, PLT in the last 168 hours. Basic Metabolic Panel: Recent Labs  Lab 09/23/21 1958  NA 131*  K 3.2*  CL 92*  CO2 28  GLUCOSE 98  BUN 11  CREATININE 1.34*  CALCIUM 9.0   GFR: Estimated Creatinine Clearance: 99.5 mL/min (A) (by C-G formula based on SCr of 1.34 mg/dL (H)). Liver Function Tests: Recent Labs  Lab 09/23/21 1958  AST 27  ALT 24  ALKPHOS 97  BILITOT 0.6  PROT 7.4  ALBUMIN 4.0   No results for input(s): LIPASE, AMYLASE in the last 168 hours. No results for input(s): AMMONIA in the last 168 hours. Coagulation Profile: Recent Labs  Lab 09/23/21 1958  INR 1.1   Cardiac Enzymes: No results for input(s): CKTOTAL, CKMB, CKMBINDEX, TROPONINI in the last 168 hours. BNP (last 3 results) No results for input(s): PROBNP in the last 8760 hours. HbA1C: No results for input(s): HGBA1C in the last 72 hours. CBG: Recent Labs  Lab 09/23/21 1959  GLUCAP 108*   Lipid Profile: No results for input(s): CHOL, HDL, LDLCALC, TRIG, CHOLHDL, LDLDIRECT in the last 72 hours. Thyroid Function Tests: No results for input(s): TSH, T4TOTAL, FREET4, T3FREE, THYROIDAB in the last 72 hours. Anemia Panel: No results for input(s): VITAMINB12, FOLATE, FERRITIN, TIBC, IRON, RETICCTPCT in the last 72 hours. Urine analysis:    Component Value Date/Time   COLORURINE STRAW (A) 05/10/2018 1402   APPEARANCEUR CLEAR 05/10/2018 1402   LABSPEC 1.004 (L) 05/10/2018 1402   PHURINE 8.0 05/10/2018 1402   GLUCOSEU NEGATIVE 05/10/2018 1402   HGBUR NEGATIVE 05/10/2018 1402   BILIRUBINUR NEGATIVE 05/10/2018 1402   KETONESUR NEGATIVE 05/10/2018 1402   PROTEINUR NEGATIVE 05/10/2018 1402   UROBILINOGEN 0.2 10/21/2009 0926   NITRITE NEGATIVE 05/10/2018 1402   LEUKOCYTESUR NEGATIVE 05/10/2018 1402    Radiological Exams on Admission: CT HEAD CODE STROKE WO CONTRAST  Result Date: 09/23/2021 CLINICAL DATA:  Code stroke. Acute neuro deficit. Left-sided weakness EXAM: CT HEAD WITHOUT  CONTRAST TECHNIQUE: Contiguous axial images were obtained from the base of the skull through the vertex without intravenous contrast. COMPARISON:  CT head 05/27/2017 FINDINGS: Brain: No evidence of acute infarction, hemorrhage, hydrocephalus, extra-axial collection or mass lesion/mass effect. Vascular: Negative for hyperdense vessel Skull: Negative Sinuses/Orbits: Paranasal sinuses clear.  Negative orbit Other: None ASPECTS (Alberta Stroke Program Early CT Score) - Ganglionic level infarction (caudate, lentiform nuclei, internal capsule, insula, M1-M3 cortex): 7 - Supraganglionic infarction (M4-M6 cortex): 3 Total score (0-10 with 10 being normal): 10 IMPRESSION: 1. Negative CT head 2. ASPECTS is 10 3. Code stroke imaging results were communicated on 09/23/2021 at 8:14 pm to provider Derry Lory via text page Electronically Signed   By: Marlan Palau M.D.   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 -obtain MRI brain without contrast  - MRA Angio and US Carotid doppler -obtain echo  - start daily aspirin -start statin if LDL >70  -Obtain A1c and lipids -PT/OT/SLT -Frequent neuro checks and keep on telemetry -Allow for permissive hypertension with blood pressure treatment as needed only if systolic goes above 220 -Obtain UDS -Obtain EEG   Hx of migraine  Work-up of TIA versus hemiplegic migraine as above Continue Nurtec as needed   HTN Allow for permissive hypertension during work-up  Morbid obesity BMI of 34   DVT prophylaxis:.Lovenox Code Status: Full Family Communication: Plan discussed with patient at bedside  disposition Plan: Home with observation Consults called: Neurology Admission status: Observation   Ninoska Goswick T Gurjot Brisco DO [facility]   If 7PM-7AM, please contact  night-coverage [email or web address]   09/24/2021, 1:21 AM

## 2021-09-24 NOTE — ED Notes (Signed)
Pt going to MRI

## 2021-09-24 NOTE — Evaluation (Signed)
Occupational Therapy Evaluation Patient Details Name: Dylan Guerrero MRN: [id number] DOB: [birth year] Today's Date: 09/24/2021   History of Present Illness Pt is a 45 y/o male admitted 12/20 after episode of loss of consciousness, headache and L sided weakness. CT, MRI negative. PMH includes: anxiety, depression, HTN, migraine.   Clinical Impression   PTA patient independent. Admitted for above and presenting near baseline modified independence level using RW for ADLs, transfers and mobility.  Patient reports mild tingling in L hand fingertips but functional.  Pt agreeable with recommendations of no further OT needs required acutely or after dc home.  OT will sign off.       Recommendations for follow up therapy are one component of a multi-disciplinary discharge planning process, led by the attending physician.  Recommendations may be updated based on patient status, additional functional criteria and insurance authorization.   Follow Up Recommendations  No OT follow up    Assistance Recommended at Discharge None  Functional Status Assessment     Equipment Recommendations  None recommended by OT    Recommendations for Other Services       Precautions / Restrictions Precautions Precautions: Fall Restrictions Weight Bearing Restrictions: No      Mobility Bed Mobility Overal bed mobility: Modified Independent                  Transfers Overall transfer level: Modified independent                        Balance Overall balance assessment: No apparent balance deficits (not formally assessed)                                         ADL either performed or assessed with clinical judgement   ADL Overall ADL's : Modified independent                                     Functional mobility during ADLs: Modified independent General ADL Comments: using RW     Vision Baseline Vision/History: 1 Wears glasses Ability to See  in Adequate Light: 0 Adequate Patient Visual Report: No change from baseline Vision Assessment?: No apparent visual deficits     Perception     Praxis      Pertinent Vitals/Pain Pain Assessment: 0-10 Pain Score: 5  Pain Location: headache Pain Descriptors / Indicators: Headache Pain Intervention(s): Monitored during session     Hand Dominance Right   Extremity/Trunk Assessment Upper Extremity Assessment Upper Extremity Assessment: Overall WFL for tasks assessed (mild L sided numbness in fingers but WFL)   Lower Extremity Assessment Lower Extremity Assessment: Defer to PT evaluation       Communication Communication Communication: No difficulties   Cognition Arousal/Alertness: Awake/alert Behavior During Therapy: WFL for tasks assessed/performed Overall Cognitive Status: Within Functional Limits for tasks assessed                                       General Comments  HR up to 122 during activity, VSS    Exercises     Shoulder Instructions      Home Living Family/patient expects to be discharged to:: Private residence Living Arrangements: Parent Available Help at  Discharge: Family Type of Home: House Home Access: Ramped entrance;Stairs to enter Entrance Stairs-Number of Steps: 4 in back Entrance Stairs-Rails: Right Home Layout: One level     Bathroom Shower/Tub: Producer, television/film/video: Standard     Home Equipment: Grab bars - tub/shower;Shower Counsellor (2 wheels);Rollator (4 wheels);Cane - single point   Additional Comments: helps dad due to he is on oxygen; lives in [place]- pt was visiting family      Prior Functioning/Environment Prior Level of Function : Independent/Modified Independent             Mobility Comments: either cane or rollator ADLs Comments: cooks/cleans, works from home        OT Problem List: Impaired sensation      OT Treatment/Interventions:      OT Goals(Current goals can  be found in the care plan section) Acute Rehab OT Goals Patient Stated Goal: home OT Goal Formulation: With patient  OT Frequency:     Barriers to D/C:            Co-evaluation PT/OT/SLP Co-Evaluation/Treatment: Yes Reason for Co-Treatment: For patient/therapist safety   OT goals addressed during session: ADL's and self-care      AM-PAC OT "6 Clicks" Daily Activity     Outcome Measure Help from another person eating meals?: None Help from another person taking care of personal grooming?: None Help from another person toileting, which includes using toliet, bedpan, or urinal?: None Help from another person bathing (including washing, rinsing, drying)?: None Help from another person to put on and taking off regular upper body clothing?: None Help from another person to put on and taking off regular lower body clothing?: None 6 Click Score: 24   End of Session Equipment Utilized During Treatment: Rolling walker (2 wheels) Nurse Communication: Mobility status  Activity Tolerance: Patient tolerated treatment well Patient left: in bed;with call bell/phone within reach  OT Visit Diagnosis: Other abnormalities of gait and mobility (R26.89);Other symptoms and signs involving the nervous system (R29.898)                Time: 6378-5885 OT Time Calculation (min): 21 min Charges:  OT General Charges $OT Visit: 1 Visit OT Evaluation $OT Eval Low Complexity: 1 Low  Barry Brunner, OT Acute Rehabilitation Services Pager [phone] Office [phone]   Chancy Milroy 09/24/2021, 1:03 PM

## 2021-09-24 NOTE — Evaluation (Signed)
Physical Therapy Evaluation Patient Details Name: Dylan Guerrero MRN: [id number] DOB: Nov 28, 1975 Today's Date: 09/24/2021  History of Present Illness  Pt is a 45 y/o male admitted 12/20 after episode of loss of consciousness, headache and L sided weakness. CT, MRI negative. PMH includes: anxiety, depression, HTN, migraine.  Clinical Impression  Patient able to demonstrate mobility at his functional baseline.  Reports previously active with outpatient PT where he lives.  He is stable for d/c home and no further DME needs identified.  PT will sign off as pt can follow up at outpatient PT at d/c, however he may need medical clearance to return.      Recommendations for follow up therapy are one component of a multi-disciplinary discharge planning process, led by the attending physician.  Recommendations may be updated based on patient status, additional functional criteria and insurance authorization.  Follow Up Recommendations Outpatient PT (already active with [facility] in [place])    Assistance Recommended at Discharge PRN  Functional Status Assessment Patient has not had a recent decline in their functional status  Equipment Recommendations  None recommended by PT    Recommendations for Other Services       Precautions / Restrictions Precautions Precautions: Fall Precaution Comments: uses rollator at baseline Restrictions Weight Bearing Restrictions: No      Mobility  Bed Mobility Overal bed mobility: Modified Independent                  Transfers Overall transfer level: Modified independent                      Ambulation/Gait Ambulation/Gait assistance: Modified independent (Device/Increase time) Gait Distance (Feet): 200 Feet Assistive device: Rolling walker (2 wheels) Gait Pattern/deviations: Step-through pattern;WFL(Within Functional Limits)       General Gait Details: mobilizing with walker no LOB  Stairs            Wheelchair  Mobility    Modified Rankin (Stroke Patients Only)       Balance Overall balance assessment: No apparent balance deficits (not formally assessed)                                           Pertinent Vitals/Pain Pain Assessment: 0-10 Pain Score: 5  Pain Location: headache Pain Descriptors / Indicators: Headache Pain Intervention(s): Monitored during session    Home Living Family/patient expects to be discharged to:: Private residence Living Arrangements: Parent Available Help at Discharge: Family Type of Home: House Home Access: Ramped entrance;Stairs to enter Entrance Stairs-Rails: Right Entrance Stairs-Number of Steps: 4 in back   Home Layout: One level Home Equipment: Grab bars - tub/shower;Shower Counsellor (2 wheels);Rollator (4 wheels);Cane - single point Additional Comments: helps dad due to he is on oxygen; lives in [place]- pt was visiting family    Prior Function Prior Level of Function : Independent/Modified Independent             Mobility Comments: either cane or rollator ADLs Comments: cooks/cleans, works from home     Hand Dominance   Dominant Hand: Right    Extremity/Trunk Assessment   Upper Extremity Assessment Upper Extremity Assessment: Overall WFL for tasks assessed (mild L sided numbness in fingers but WFL)    Lower Extremity Assessment Lower Extremity Assessment: Overall WFL for tasks assessed       Communication   Communication: No  difficulties  Cognition Arousal/Alertness: Awake/alert Behavior During Therapy: WFL for tasks assessed/performed Overall Cognitive Status: Within Functional Limits for tasks assessed                                          General Comments General comments (skin integrity, edema, etc.): HR up to 122 with ambulation, denies symptoms, BP measurement supine and sitting without evidence of orthostatic hypotension    Exercises     Assessment/Plan    PT  Assessment Patient does not need any further PT services  PT Problem List         PT Treatment Interventions      PT Goals (Current goals can be found in the Care Plan section)  Acute Rehab PT Goals PT Goal Formulation: All assessment and education complete, DC therapy    Frequency     Barriers to discharge        Co-evaluation PT/OT/SLP Co-Evaluation/Treatment: Yes Reason for Co-Treatment: For patient/therapist safety PT goals addressed during session: Mobility/safety with mobility OT goals addressed during session: ADL's and self-care       AM-PAC PT "6 Clicks" Mobility  Outcome Measure Help needed turning from your back to your side while in a flat bed without using bedrails?: None Help needed moving from lying on your back to sitting on the side of a flat bed without using bedrails?: None Help needed moving to and from a bed to a chair (including a wheelchair)?: None Help needed standing up from a chair using your arms (e.g., wheelchair or bedside chair)?: None Help needed to walk in hospital room?: None Help needed climbing 3-5 steps with a railing? : None 6 Click Score: 24    End of Session   Activity Tolerance: Patient tolerated treatment well Patient left: in bed (seated EOB)   PT Visit Diagnosis: Other abnormalities of gait and mobility (R26.89)    Time: 9371-6967 PT Time Calculation (min) (ACUTE ONLY): 23 min   Charges:   PT Evaluation $PT Eval Low Complexity: 1 Low          Dylan Guerrero, PT Acute Rehabilitation Services Pager:[phone] Office:[phone] 09/24/2021   Dylan Guerrero 09/24/2021, 1:45 PM

## 2021-09-24 NOTE — ED Notes (Signed)
Patient transported to MRI 

## 2021-09-25 MED ORDER — OSELTAMIVIR PHOSPHATE 75 MG PO CAPS: 75.0000 mg | ORAL_CAPSULE | Freq: Two times a day (BID) | ORAL | 0 refills | Status: DC

## 2021-09-25 NOTE — Plan of Care (Signed)
Problem: Education: Goal: Knowledge of General Education information will improve Description: Including pain rating scale, medication(s)/side effects and non-pharmacologic comfort measures Outcome: Completed/Met   Problem: Health Behavior/Discharge Planning: Goal: Ability to manage health-related needs will improve Outcome: Completed/Met   Problem: Clinical Measurements: Goal: Ability to maintain clinical measurements within normal limits will improve Outcome: Completed/Met Goal: Will remain free from infection Outcome: Completed/Met Goal: Diagnostic test results will improve Outcome: Completed/Met Goal: Respiratory complications will improve Outcome: Completed/Met Goal: Cardiovascular complication will be avoided Outcome: Completed/Met   Problem: Activity: Goal: Risk for activity intolerance will decrease Outcome: Completed/Met   Problem: Nutrition: Goal: Adequate nutrition will be maintained Outcome: Completed/Met   Problem: Coping: Goal: Level of anxiety will decrease Outcome: Completed/Met   Problem: Elimination: Goal: Will not experience complications related to bowel motility Outcome: Completed/Met Goal: Will not experience complications related to urinary retention Outcome: Completed/Met   Problem: Pain Managment: Goal: General experience of comfort will improve Outcome: Completed/Met   Problem: Safety: Goal: Ability to remain free from injury will improve Outcome: Completed/Met   Problem: Skin Integrity: Goal: Risk for impaired skin integrity will decrease Outcome: Completed/Met   Problem: Education: Goal: Knowledge of disease or condition will improve Outcome: Completed/Met Goal: Knowledge of secondary prevention will improve (SELECT ALL) Outcome: Completed/Met Goal: Knowledge of patient specific risk factors will improve (INDIVIDUALIZE FOR PATIENT) Outcome: Completed/Met Goal: Individualized Educational Video(s) Outcome: Completed/Met    Problem: Coping: Goal: Will verbalize positive feelings about self Outcome: Completed/Met Goal: Will identify appropriate support needs Outcome: Completed/Met   Problem: Health Behavior/Discharge Planning: Goal: Ability to manage health-related needs will improve Outcome: Completed/Met   Problem: Self-Care: Goal: Ability to participate in self-care as condition permits will improve Outcome: Completed/Met   Problem: Ischemic Stroke/TIA Tissue Perfusion: Goal: Complications of ischemic stroke/TIA will be minimized Outcome: Completed/Met   Discharge instructions reviewed with patient.  These included, but were not limited to, the following:  discharge medications, when to call the MD, f/u appointments, review of hospital course, educational materials, etc.  Comprehension of information ascertained via use of "teach-back" technique.

## 2021-09-25 NOTE — Plan of Care (Signed)
?  Problem: Education: ?Goal: Knowledge of General Education information will improve ?Description: Including pain rating scale, medication(s)/side effects and non-pharmacologic comfort measures ?Outcome: Progressing ?  ?Problem: Health Behavior/Discharge Planning: ?Goal: Ability to manage health-related needs will improve ?Outcome: Progressing ?  ?Problem: Clinical Measurements: ?Goal: Ability to maintain clinical measurements within normal limits will improve ?Outcome: Progressing ?Goal: Will remain free from infection ?Outcome: Progressing ?Goal: Diagnostic test results will improve ?Outcome: Progressing ?Goal: Respiratory complications will improve ?Outcome: Progressing ?Goal: Cardiovascular complication will be avoided ?Outcome: Progressing ?  ?Problem: Activity: ?Goal: Risk for activity intolerance will decrease ?Outcome: Progressing ?  ?Problem: Nutrition: ?Goal: Adequate nutrition will be maintained ?Outcome: Progressing ?  ?Problem: Coping: ?Goal: Level of anxiety will decrease ?Outcome: Progressing ?  ?Problem: Elimination: ?Goal: Will not experience complications related to bowel motility ?Outcome: Progressing ?Goal: Will not experience complications related to urinary retention ?Outcome: Progressing ?  ?Problem: Pain Managment: ?Goal: General experience of comfort will improve ?Outcome: Progressing ?  ?Problem: Safety: ?Goal: Ability to remain free from injury will improve ?Outcome: Progressing ?  ?Problem: Skin Integrity: ?Goal: Risk for impaired skin integrity will decrease ?Outcome: Progressing ?  ?Problem: Education: ?Goal: Knowledge of disease or condition will improve ?Outcome: Progressing ?Goal: Knowledge of secondary prevention will improve (SELECT ALL) ?Outcome: Progressing ?Goal: Knowledge of patient specific risk factors will improve (INDIVIDUALIZE FOR PATIENT) ?Outcome: Progressing ?Goal: Individualized Educational Video(s) ?Outcome: Progressing ?  ?Problem: Coping: ?Goal: Will verbalize  positive feelings about self ?Outcome: Progressing ?Goal: Will identify appropriate support needs ?Outcome: Progressing ?  ?Problem: Health Behavior/Discharge Planning: ?Goal: Ability to manage health-related needs will improve ?Outcome: Progressing ?  ?Problem: Self-Care: ?Goal: Ability to participate in self-care as condition permits will improve ?Outcome: Progressing ?  ?Problem: Ischemic Stroke/TIA Tissue Perfusion: ?Goal: Complications of ischemic stroke/TIA will be minimized ?Outcome: Progressing ?  ?

## 2021-09-25 NOTE — TOC Transition Note (Signed)
Transition of Care [facility]) - CM/SW Discharge Note   Patient Details  Name: Dylan Guerrero MRN: [id number] Date of Birth: 04-Jun-1976  Transition of Care [facility]) CM/SW Contact:  Kermit Balo, RN Phone Number: 09/25/2021, 11:06 AM   Clinical Narrative:    Patient is from [place], [place] up here visiting family. Pt denies any issues with his home medications or transportation.  Pt has been going to [facility] in [place] and wants to continue with their services. CM will fax [facility] resumption orders.  Pt has transport home today.   Final next level of care: OP Rehab Barriers to Discharge: No Barriers Identified   Patient Goals and CMS Choice     Choice offered to / list presented to : Patient  Discharge Placement                       Discharge Plan and Services                                     Social Determinants of Health (SDOH) Interventions     Readmission Risk Interventions No flowsheet data found.

## 2021-10-09 ENCOUNTER — Other Ambulatory Visit: Payer: Self-pay

## 2021-10-09 ENCOUNTER — Encounter (HOSPITAL_COMMUNITY): Payer: Self-pay | Admitting: Psychiatry

## 2021-10-09 ENCOUNTER — Ambulatory Visit (HOSPITAL_BASED_OUTPATIENT_CLINIC_OR_DEPARTMENT_OTHER): Payer: 59 | Admitting: Psychiatry

## 2021-10-09 DIAGNOSIS — F411 Generalized anxiety disorder: Secondary | ICD-10-CM | POA: Diagnosis not present

## 2021-10-09 DIAGNOSIS — F4310 Post-traumatic stress disorder, unspecified: Secondary | ICD-10-CM | POA: Diagnosis not present

## 2021-10-09 MED ORDER — HYDROXYZINE HCL 25 MG PO TABS: 25.0000 mg | ORAL_TABLET | Freq: Three times a day (TID) | ORAL | 2 refills | Status: DC

## 2021-10-09 MED ORDER — DULOXETINE HCL 60 MG PO CPEP: 60.0000 mg | ORAL_CAPSULE | Freq: Every day | ORAL | 2 refills | Status: DC

## 2021-10-09 MED ORDER — TRAZODONE HCL 100 MG PO TABS: 100.0000 mg | ORAL_TABLET | Freq: Every day | ORAL | 2 refills | Status: DC
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
## 2021-10-29 ENCOUNTER — Ambulatory Visit: Payer: PRIVATE HEALTH INSURANCE | Admitting: Neurology

## 2021-11-03 ENCOUNTER — Ambulatory Visit: Payer: PRIVATE HEALTH INSURANCE | Admitting: Neurology

## 2021-11-05 ENCOUNTER — Ambulatory Visit: Payer: PRIVATE HEALTH INSURANCE | Admitting: Diagnostic Neuroimaging

## 2021-11-19 ENCOUNTER — Ambulatory Visit: Payer: PRIVATE HEALTH INSURANCE | Admitting: Diagnostic Neuroimaging

## 2021-11-26 ENCOUNTER — Ambulatory Visit: Payer: PRIVATE HEALTH INSURANCE | Admitting: Neurology

## 2021-11-26 ENCOUNTER — Ambulatory Visit (HOSPITAL_COMMUNITY): Payer: 59 | Admitting: Clinical

## 2021-12-08 ENCOUNTER — Ambulatory Visit (HOSPITAL_COMMUNITY): Payer: 59 | Admitting: Clinical

## 2021-12-25 ENCOUNTER — Ambulatory Visit: Payer: PRIVATE HEALTH INSURANCE | Admitting: Neurology

## 2021-12-31 ENCOUNTER — Telehealth (HOSPITAL_COMMUNITY): Payer: Self-pay | Admitting: Clinical

## 2022-01-05 ENCOUNTER — Ambulatory Visit: Payer: 59 | Admitting: Diagnostic Neuroimaging

## 2022-01-05 ENCOUNTER — Encounter: Payer: Self-pay | Admitting: Diagnostic Neuroimaging

## 2022-01-05 VITALS — BP 122/84 | HR 88 | Ht 74.0 in | Wt 268.0 lb

## 2022-01-05 DIAGNOSIS — G43109 Migraine with aura, not intractable, without status migrainosus: Secondary | ICD-10-CM | POA: Diagnosis not present

## 2022-01-05 MED ORDER — AJOVY 225 MG/1.5ML ~~LOC~~ SOAJ: 225.0000 mg | SUBCUTANEOUS | 3 refills | Status: DC

## 2022-01-05 MED ORDER — NURTEC 75 MG PO TBDP: 75.0000 mg | ORAL_TABLET | Freq: Every day | ORAL | 6 refills | Status: DC | PRN
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
## 2022-01-06 ENCOUNTER — Other Ambulatory Visit: Payer: Self-pay | Admitting: Diagnostic Neuroimaging

## 2022-01-07 ENCOUNTER — Encounter: Payer: Self-pay | Admitting: *Deleted

## 2022-01-07 ENCOUNTER — Ambulatory Visit (HOSPITAL_COMMUNITY): Payer: 59 | Admitting: Clinical

## 2022-01-07 ENCOUNTER — Encounter (HOSPITAL_COMMUNITY): Payer: Self-pay

## 2022-01-07 ENCOUNTER — Telehealth: Payer: Self-pay | Admitting: *Deleted

## 2022-01-07 MED ORDER — RIZATRIPTAN BENZOATE 10 MG PO TBDP: 10.0000 mg | ORAL_TABLET | ORAL | 11 refills | Status: DC | PRN

## 2022-01-07 NOTE — Telephone Encounter (Signed)
The request for coverage for NURTEC TAB 75MG  ODT, use as directed (10 per month), is denied. This decision is based on health plan criteria for NURTEC TAB 75MG  ODT. This medicine is covered only if: ?You have tried or cannot use at least FIVE from the following: Almotriptan, Eletriptan, Frovatriptan, ?Naratriptan, Rizatriptan ODT, Sumatriptan, Zolmitriptan ODT. ?Will send to MD.  ?

## 2022-01-07 NOTE — Telephone Encounter (Addendum)
[employer] PA, key [id number], [id number]. Your information has been sent to [employer]. [employer] is reviewing your PA request. Typically an electronic response will be received within 24-72 hours. ? ? ?

## 2022-01-07 NOTE — Telephone Encounter (Signed)
Sent my chart to advise. ?

## 2022-01-07 NOTE — Telephone Encounter (Signed)
Meds ordered this encounter  ?Medications  ? rizatriptan (MAXALT-MLT) 10 MG disintegrating tablet  ?  Sig: Take 1 tablet (10 mg total) by mouth as needed for migraine. May repeat in 2 hours if needed  ?  Dispense:  9 tablet  ?  Refill:  11  ? ? ?Suanne Marker, MD 01/07/2022, 4:12 PM ?Certified in Neurology, Neurophysiology and Neuroimaging ? ?[facility] ?[address], [address] ?[place], [place] [address] ?[phone] ? ?

## 2022-01-07 NOTE — Addendum Note (Signed)
Addended by: Andrey Spearman R on: 01/07/2022 04:12 PM ? ? Modules accepted: Orders ? ?

## 2022-01-08 ENCOUNTER — Ambulatory Visit (HOSPITAL_COMMUNITY): Payer: 59 | Admitting: Psychiatry

## 2022-01-15 ENCOUNTER — Ambulatory Visit (HOSPITAL_COMMUNITY): Payer: 59 | Admitting: Psychiatry

## 2022-01-17 ENCOUNTER — Other Ambulatory Visit (HOSPITAL_COMMUNITY): Payer: Self-pay | Admitting: Psychiatry

## 2022-01-17 DIAGNOSIS — F4310 Post-traumatic stress disorder, unspecified: Secondary | ICD-10-CM

## 2022-01-17 DIAGNOSIS — F411 Generalized anxiety disorder: Secondary | ICD-10-CM

## 2022-01-18 ENCOUNTER — Other Ambulatory Visit (HOSPITAL_COMMUNITY): Payer: Self-pay | Admitting: Psychiatry

## 2022-01-18 DIAGNOSIS — F4310 Post-traumatic stress disorder, unspecified: Secondary | ICD-10-CM

## 2022-01-18 DIAGNOSIS — F411 Generalized anxiety disorder: Secondary | ICD-10-CM

## 2022-01-22 ENCOUNTER — Ambulatory Visit (HOSPITAL_COMMUNITY): Payer: 59 | Admitting: Licensed Clinical Social Worker

## 2022-01-22 ENCOUNTER — Other Ambulatory Visit (HOSPITAL_COMMUNITY): Payer: Self-pay | Admitting: *Deleted

## 2022-01-22 DIAGNOSIS — F4310 Post-traumatic stress disorder, unspecified: Secondary | ICD-10-CM

## 2022-01-22 DIAGNOSIS — F411 Generalized anxiety disorder: Secondary | ICD-10-CM

## 2022-01-22 MED ORDER — DULOXETINE HCL 60 MG PO CPEP: 60.0000 mg | ORAL_CAPSULE | Freq: Every day | ORAL | 0 refills | Status: DC

## 2022-01-23 ENCOUNTER — Other Ambulatory Visit (HOSPITAL_COMMUNITY): Payer: Self-pay | Admitting: Psychiatry

## 2022-01-23 DIAGNOSIS — F411 Generalized anxiety disorder: Secondary | ICD-10-CM

## 2022-01-23 DIAGNOSIS — F4310 Post-traumatic stress disorder, unspecified: Secondary | ICD-10-CM

## 2022-01-29 ENCOUNTER — Encounter (HOSPITAL_COMMUNITY): Payer: Self-pay | Admitting: Psychiatry

## 2022-01-29 ENCOUNTER — Ambulatory Visit (HOSPITAL_COMMUNITY): Payer: 59 | Admitting: Psychiatry

## 2022-01-29 DIAGNOSIS — F411 Generalized anxiety disorder: Secondary | ICD-10-CM | POA: Diagnosis not present

## 2022-01-29 DIAGNOSIS — F4310 Post-traumatic stress disorder, unspecified: Secondary | ICD-10-CM

## 2022-01-29 MED ORDER — TRAZODONE HCL 100 MG PO TABS: 100.0000 mg | ORAL_TABLET | Freq: Every day | ORAL | 0 refills | Status: DC

## 2022-01-29 MED ORDER — HYDROXYZINE HCL 25 MG PO TABS: 25.0000 mg | ORAL_TABLET | Freq: Three times a day (TID) | ORAL | 2 refills | Status: DC

## 2022-01-29 MED ORDER — DULOXETINE HCL 60 MG PO CPEP: 60.0000 mg | ORAL_CAPSULE | Freq: Every day | ORAL | 0 refills | Status: DC
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
## 2022-02-16 ENCOUNTER — Ambulatory Visit: Payer: PRIVATE HEALTH INSURANCE | Admitting: Neurology

## 2022-04-13 ENCOUNTER — Telehealth: Payer: Self-pay | Admitting: *Deleted

## 2022-04-13 NOTE — Telephone Encounter (Signed)
Received email documents attached. [employer] is reviewing your PA request. Typically an electronic response will be received within 24-72 hours

## 2022-04-13 NOTE — Telephone Encounter (Signed)
[employer] approved through 04/14/2023 . Approval letter faxed to pharmacy.

## 2022-04-13 NOTE — Telephone Encounter (Signed)
[employer] PA, Key: [id number], [id number]. faxed office notes to be attached.

## 2022-04-23 ENCOUNTER — Ambulatory Visit: Payer: 59 | Admitting: Neurology

## 2022-04-23 ENCOUNTER — Encounter: Payer: Self-pay | Admitting: Neurology

## 2022-04-23 ENCOUNTER — Telehealth: Payer: Self-pay | Admitting: Neurology

## 2022-04-23 NOTE — Telephone Encounter (Signed)
Patient canceled multiple appts in sleep clinic and NS today in sleep clinic.  He has not been seen in sleep clinic in over 3 years, he would have to be re-referred by either primary care or primary neurologist for new sleep consultation.  Please do not schedule as a follow-up.

## 2022-05-07 ENCOUNTER — Telehealth (HOSPITAL_COMMUNITY): Payer: 59 | Admitting: Psychiatry

## 2022-05-28 ENCOUNTER — Other Ambulatory Visit (HOSPITAL_COMMUNITY): Payer: Self-pay | Admitting: *Deleted

## 2022-05-28 DIAGNOSIS — F4310 Post-traumatic stress disorder, unspecified: Secondary | ICD-10-CM

## 2022-05-28 DIAGNOSIS — F411 Generalized anxiety disorder: Secondary | ICD-10-CM

## 2022-05-28 MED ORDER — DULOXETINE HCL 60 MG PO CPEP: 60.0000 mg | ORAL_CAPSULE | Freq: Every day | ORAL | 0 refills | Status: DC

## 2022-06-18 ENCOUNTER — Ambulatory Visit (HOSPITAL_COMMUNITY): Payer: 59 | Admitting: Psychiatry

## 2022-06-18 ENCOUNTER — Encounter (HOSPITAL_COMMUNITY): Payer: Self-pay | Admitting: Psychiatry

## 2022-06-18 DIAGNOSIS — F4310 Post-traumatic stress disorder, unspecified: Secondary | ICD-10-CM | POA: Diagnosis not present

## 2022-06-18 DIAGNOSIS — F411 Generalized anxiety disorder: Secondary | ICD-10-CM

## 2022-06-18 MED ORDER — HYDROXYZINE HCL 25 MG PO TABS: 25.0000 mg | ORAL_TABLET | Freq: Three times a day (TID) | ORAL | 2 refills | Status: DC

## 2022-06-18 MED ORDER — DULOXETINE HCL 60 MG PO CPEP: 60.0000 mg | ORAL_CAPSULE | Freq: Every day | ORAL | 0 refills | Status: DC

## 2022-06-18 MED ORDER — TRAZODONE HCL 100 MG PO TABS: 100.0000 mg | ORAL_TABLET | Freq: Every day | ORAL | 0 refills | Status: DC
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
## 2022-06-22 ENCOUNTER — Other Ambulatory Visit (HOSPITAL_COMMUNITY): Payer: Self-pay | Admitting: *Deleted

## 2022-06-22 DIAGNOSIS — F4310 Post-traumatic stress disorder, unspecified: Secondary | ICD-10-CM

## 2022-06-22 DIAGNOSIS — F411 Generalized anxiety disorder: Secondary | ICD-10-CM

## 2022-06-22 MED ORDER — DULOXETINE HCL 60 MG PO CPEP: 60.0000 mg | ORAL_CAPSULE | Freq: Every day | ORAL | 2 refills | Status: DC

## 2022-09-17 ENCOUNTER — Ambulatory Visit (HOSPITAL_COMMUNITY): Payer: 59 | Admitting: Psychiatry

## 2022-10-07 ENCOUNTER — Telehealth (HOSPITAL_COMMUNITY): Payer: Self-pay

## 2022-10-07 DIAGNOSIS — F4310 Post-traumatic stress disorder, unspecified: Secondary | ICD-10-CM

## 2022-10-07 DIAGNOSIS — F411 Generalized anxiety disorder: Secondary | ICD-10-CM

## 2022-10-07 MED ORDER — DULOXETINE HCL 60 MG PO CPEP: 60.0000 mg | ORAL_CAPSULE | Freq: Every day | ORAL | 0 refills | Status: DC

## 2022-10-07 NOTE — Telephone Encounter (Signed)
Received fax from pharmacy requesting a refill for the following medications  Last visit 06/18/22 Next visit 10/15/22     Disp Refills Start End   DULoxetine (CYMBALTA) 60 MG capsule 30 capsule 2 06/22/2022 09/20/2022   Sig - Route: Take 1 capsule (60 mg total) by mouth daily. - Oral   Sent to pharmacy as: DULoxetine (CYMBALTA) 60 MG capsule   Notes to Pharmacy: Pt needs to keep appointment for future refills.   E-Prescribing Status: Receipt confirmed by pharmacy (06/22/2022 10:42 AM EDT)      [facility] [place], [address] Phone: [phone]  Fax: [phone]

## 2022-10-07 NOTE — Telephone Encounter (Signed)
Send 30 days supply. Please inform the patient.

## 2022-10-15 ENCOUNTER — Encounter (HOSPITAL_COMMUNITY): Payer: Self-pay | Admitting: Psychiatry

## 2022-10-15 ENCOUNTER — Ambulatory Visit (HOSPITAL_COMMUNITY): Payer: 59 | Admitting: Psychiatry

## 2022-10-15 DIAGNOSIS — F4310 Post-traumatic stress disorder, unspecified: Secondary | ICD-10-CM | POA: Diagnosis not present

## 2022-10-15 DIAGNOSIS — F411 Generalized anxiety disorder: Secondary | ICD-10-CM | POA: Diagnosis not present

## 2022-10-15 MED ORDER — HYDROXYZINE HCL 25 MG PO TABS: 25.0000 mg | ORAL_TABLET | Freq: Three times a day (TID) | ORAL | 2 refills | Status: DC

## 2022-10-15 MED ORDER — TRAZODONE HCL 100 MG PO TABS: 100.0000 mg | ORAL_TABLET | Freq: Every day | ORAL | 0 refills | Status: DC

## 2022-10-15 MED ORDER — DULOXETINE HCL 60 MG PO CPEP: 60.0000 mg | ORAL_CAPSULE | Freq: Every day | ORAL | 2 refills | Status: DC
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
## 2023-01-11 ENCOUNTER — Telehealth: Payer: 59 | Admitting: Diagnostic Neuroimaging

## 2023-01-11 ENCOUNTER — Encounter: Payer: Self-pay | Admitting: Diagnostic Neuroimaging

## 2023-01-14 ENCOUNTER — Ambulatory Visit (HOSPITAL_COMMUNITY): Payer: 59 | Admitting: Psychiatry

## 2023-02-08 ENCOUNTER — Other Ambulatory Visit (HOSPITAL_COMMUNITY): Payer: Self-pay | Admitting: *Deleted

## 2023-02-08 DIAGNOSIS — F4310 Post-traumatic stress disorder, unspecified: Secondary | ICD-10-CM

## 2023-02-08 DIAGNOSIS — F411 Generalized anxiety disorder: Secondary | ICD-10-CM

## 2023-02-08 MED ORDER — DULOXETINE HCL 60 MG PO CPEP: 60.0000 mg | ORAL_CAPSULE | Freq: Every day | ORAL | 0 refills | Status: DC

## 2023-03-04 ENCOUNTER — Ambulatory Visit (HOSPITAL_BASED_OUTPATIENT_CLINIC_OR_DEPARTMENT_OTHER): Payer: 59 | Admitting: Psychiatry

## 2023-03-04 ENCOUNTER — Encounter (HOSPITAL_COMMUNITY): Payer: Self-pay | Admitting: Psychiatry

## 2023-03-04 VITALS — BP 125/87 | HR 90 | Ht 75.0 in | Wt 266.0 lb

## 2023-03-04 DIAGNOSIS — F411 Generalized anxiety disorder: Secondary | ICD-10-CM

## 2023-03-04 DIAGNOSIS — F4310 Post-traumatic stress disorder, unspecified: Secondary | ICD-10-CM

## 2023-03-04 MED ORDER — TRAZODONE HCL 100 MG PO TABS: 100.0000 mg | ORAL_TABLET | Freq: Every day | ORAL | 0 refills | Status: DC

## 2023-03-04 MED ORDER — DULOXETINE HCL 60 MG PO CPEP: 60.0000 mg | ORAL_CAPSULE | Freq: Every day | ORAL | 2 refills | Status: DC

## 2023-03-04 MED ORDER — HYDROXYZINE HCL 25 MG PO TABS: 25.0000 mg | ORAL_TABLET | Freq: Three times a day (TID) | ORAL | 2 refills | Status: DC
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
## 2023-03-18 ENCOUNTER — Other Ambulatory Visit: Payer: Self-pay | Admitting: Diagnostic Neuroimaging

## 2023-05-10 DIAGNOSIS — Z79899 Other long term (current) drug therapy: Secondary | ICD-10-CM | POA: Diagnosis not present

## 2023-05-10 DIAGNOSIS — R918 Other nonspecific abnormal finding of lung field: Secondary | ICD-10-CM | POA: Diagnosis not present

## 2023-05-10 DIAGNOSIS — R0602 Shortness of breath: Secondary | ICD-10-CM | POA: Diagnosis not present

## 2023-05-10 DIAGNOSIS — R911 Solitary pulmonary nodule: Secondary | ICD-10-CM | POA: Diagnosis not present

## 2023-05-10 DIAGNOSIS — I10 Essential (primary) hypertension: Secondary | ICD-10-CM | POA: Diagnosis not present

## 2023-05-10 DIAGNOSIS — R0989 Other specified symptoms and signs involving the circulatory and respiratory systems: Secondary | ICD-10-CM | POA: Diagnosis not present

## 2023-05-10 DIAGNOSIS — J45909 Unspecified asthma, uncomplicated: Secondary | ICD-10-CM | POA: Diagnosis not present

## 2023-05-10 DIAGNOSIS — J4520 Mild intermittent asthma, uncomplicated: Secondary | ICD-10-CM | POA: Diagnosis not present

## 2023-05-18 DIAGNOSIS — R918 Other nonspecific abnormal finding of lung field: Secondary | ICD-10-CM | POA: Diagnosis not present

## 2023-05-18 DIAGNOSIS — I10 Essential (primary) hypertension: Secondary | ICD-10-CM | POA: Diagnosis not present

## 2023-05-18 DIAGNOSIS — R079 Chest pain, unspecified: Secondary | ICD-10-CM | POA: Diagnosis not present

## 2023-05-18 DIAGNOSIS — J45909 Unspecified asthma, uncomplicated: Secondary | ICD-10-CM | POA: Diagnosis not present

## 2023-06-01 ENCOUNTER — Other Ambulatory Visit (HOSPITAL_COMMUNITY): Payer: Self-pay | Admitting: Psychiatry

## 2023-06-01 DIAGNOSIS — F411 Generalized anxiety disorder: Secondary | ICD-10-CM

## 2023-06-01 DIAGNOSIS — F4310 Post-traumatic stress disorder, unspecified: Secondary | ICD-10-CM

## 2023-06-02 ENCOUNTER — Other Ambulatory Visit (HOSPITAL_COMMUNITY): Payer: Self-pay | Admitting: Psychiatry

## 2023-06-02 DIAGNOSIS — F411 Generalized anxiety disorder: Secondary | ICD-10-CM

## 2023-06-02 DIAGNOSIS — F4310 Post-traumatic stress disorder, unspecified: Secondary | ICD-10-CM

## 2023-06-04 ENCOUNTER — Other Ambulatory Visit (HOSPITAL_COMMUNITY): Payer: Self-pay

## 2023-06-04 DIAGNOSIS — F411 Generalized anxiety disorder: Secondary | ICD-10-CM

## 2023-06-04 DIAGNOSIS — F4310 Post-traumatic stress disorder, unspecified: Secondary | ICD-10-CM

## 2023-06-04 MED ORDER — DULOXETINE HCL 60 MG PO CPEP: 60.0000 mg | ORAL_CAPSULE | Freq: Every day | ORAL | 0 refills | Status: DC

## 2023-06-05 ENCOUNTER — Other Ambulatory Visit (HOSPITAL_COMMUNITY): Payer: Self-pay | Admitting: Psychiatry

## 2023-06-05 DIAGNOSIS — F411 Generalized anxiety disorder: Secondary | ICD-10-CM

## 2023-06-05 DIAGNOSIS — F4310 Post-traumatic stress disorder, unspecified: Secondary | ICD-10-CM

## 2023-06-10 ENCOUNTER — Ambulatory Visit (HOSPITAL_BASED_OUTPATIENT_CLINIC_OR_DEPARTMENT_OTHER): Payer: 59 | Admitting: Psychiatry

## 2023-06-10 ENCOUNTER — Other Ambulatory Visit: Payer: Self-pay

## 2023-06-10 ENCOUNTER — Encounter (HOSPITAL_COMMUNITY): Payer: Self-pay | Admitting: Psychiatry

## 2023-06-10 VITALS — BP 144/86 | HR 100 | Ht 74.0 in | Wt 263.0 lb

## 2023-06-10 DIAGNOSIS — F4310 Post-traumatic stress disorder, unspecified: Secondary | ICD-10-CM

## 2023-06-10 DIAGNOSIS — F411 Generalized anxiety disorder: Secondary | ICD-10-CM

## 2023-06-10 MED ORDER — TRAZODONE HCL 100 MG PO TABS
100.0000 mg | ORAL_TABLET | Freq: Every day | ORAL | 0 refills | Status: DC
Start: 2023-06-10 — End: 2023-09-09

## 2023-06-10 MED ORDER — HYDROXYZINE HCL 25 MG PO TABS: 25.0000 mg | ORAL_TABLET | Freq: Three times a day (TID) | ORAL | 2 refills | Status: DC

## 2023-06-10 MED ORDER — DULOXETINE HCL 60 MG PO CPEP
60.0000 mg | ORAL_CAPSULE | Freq: Every day | ORAL | 0 refills | Status: DC
Start: 2023-06-10 — End: 2023-09-09
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
## 2023-07-08 ENCOUNTER — Ambulatory Visit (HOSPITAL_COMMUNITY): Payer: 59 | Admitting: Psychiatry

## 2023-08-11 DIAGNOSIS — R0602 Shortness of breath: Secondary | ICD-10-CM | POA: Diagnosis not present

## 2023-08-30 DIAGNOSIS — R9389 Abnormal findings on diagnostic imaging of other specified body structures: Secondary | ICD-10-CM | POA: Diagnosis not present

## 2023-08-30 DIAGNOSIS — G4733 Obstructive sleep apnea (adult) (pediatric): Secondary | ICD-10-CM | POA: Diagnosis not present

## 2023-09-06 ENCOUNTER — Other Ambulatory Visit (HOSPITAL_COMMUNITY): Payer: Self-pay

## 2023-09-09 ENCOUNTER — Telehealth (HOSPITAL_COMMUNITY): Payer: 59 | Admitting: Psychiatry

## 2023-09-09 ENCOUNTER — Encounter (HOSPITAL_COMMUNITY): Payer: Self-pay | Admitting: Psychiatry

## 2023-09-09 VITALS — Wt 268.0 lb

## 2023-09-09 DIAGNOSIS — F4310 Post-traumatic stress disorder, unspecified: Secondary | ICD-10-CM

## 2023-09-09 DIAGNOSIS — F411 Generalized anxiety disorder: Secondary | ICD-10-CM

## 2023-09-09 MED ORDER — TRAZODONE HCL 100 MG PO TABS: 100.0000 mg | ORAL_TABLET | Freq: Every day | ORAL | 0 refills | Status: DC

## 2023-09-09 MED ORDER — DULOXETINE HCL 60 MG PO CPEP: 60.0000 mg | ORAL_CAPSULE | Freq: Every day | ORAL | 0 refills | Status: DC

## 2023-09-09 MED ORDER — HYDROXYZINE HCL 25 MG PO TABS
25.0000 mg | ORAL_TABLET | Freq: Three times a day (TID) | ORAL | 2 refills | Status: DC
Start: 2023-09-09 — End: 2023-12-09
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
## 2023-11-05 DIAGNOSIS — R918 Other nonspecific abnormal finding of lung field: Secondary | ICD-10-CM | POA: Diagnosis not present

## 2023-11-05 DIAGNOSIS — R599 Enlarged lymph nodes, unspecified: Secondary | ICD-10-CM | POA: Diagnosis not present

## 2023-11-10 DIAGNOSIS — R9389 Abnormal findings on diagnostic imaging of other specified body structures: Secondary | ICD-10-CM | POA: Diagnosis not present

## 2023-12-03 ENCOUNTER — Other Ambulatory Visit (HOSPITAL_COMMUNITY): Payer: Self-pay | Admitting: Psychiatry

## 2023-12-03 DIAGNOSIS — F4310 Post-traumatic stress disorder, unspecified: Secondary | ICD-10-CM

## 2023-12-03 DIAGNOSIS — F411 Generalized anxiety disorder: Secondary | ICD-10-CM

## 2023-12-09 ENCOUNTER — Telehealth (HOSPITAL_COMMUNITY): Payer: 59 | Admitting: Psychiatry

## 2023-12-09 ENCOUNTER — Encounter (HOSPITAL_COMMUNITY): Payer: Self-pay | Admitting: Psychiatry

## 2023-12-09 VITALS — Wt 268.0 lb

## 2023-12-09 DIAGNOSIS — N182 Chronic kidney disease, stage 2 (mild): Secondary | ICD-10-CM | POA: Diagnosis not present

## 2023-12-09 DIAGNOSIS — K219 Gastro-esophageal reflux disease without esophagitis: Secondary | ICD-10-CM | POA: Diagnosis not present

## 2023-12-09 DIAGNOSIS — R7309 Other abnormal glucose: Secondary | ICD-10-CM | POA: Diagnosis not present

## 2023-12-09 DIAGNOSIS — F4310 Post-traumatic stress disorder, unspecified: Secondary | ICD-10-CM | POA: Diagnosis not present

## 2023-12-09 DIAGNOSIS — F411 Generalized anxiety disorder: Secondary | ICD-10-CM | POA: Diagnosis not present

## 2023-12-09 DIAGNOSIS — R32 Unspecified urinary incontinence: Secondary | ICD-10-CM | POA: Diagnosis not present

## 2023-12-09 DIAGNOSIS — R911 Solitary pulmonary nodule: Secondary | ICD-10-CM | POA: Diagnosis not present

## 2023-12-09 DIAGNOSIS — E876 Hypokalemia: Secondary | ICD-10-CM | POA: Diagnosis not present

## 2023-12-09 MED ORDER — TRAZODONE HCL 100 MG PO TABS
100.0000 mg | ORAL_TABLET | Freq: Every day | ORAL | 0 refills | Status: DC
Start: 2023-12-09 — End: 2024-03-08

## 2023-12-09 MED ORDER — DULOXETINE HCL 60 MG PO CPEP: 60.0000 mg | ORAL_CAPSULE | Freq: Every day | ORAL | 0 refills | Status: DC

## 2023-12-09 MED ORDER — HYDROXYZINE HCL 25 MG PO TABS: 25.0000 mg | ORAL_TABLET | Freq: Three times a day (TID) | ORAL | 2 refills | Status: DC
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
## 2023-12-16 DIAGNOSIS — K760 Fatty (change of) liver, not elsewhere classified: Secondary | ICD-10-CM | POA: Diagnosis not present

## 2023-12-16 DIAGNOSIS — R160 Hepatomegaly, not elsewhere classified: Secondary | ICD-10-CM | POA: Diagnosis not present

## 2024-03-08 ENCOUNTER — Encounter (HOSPITAL_COMMUNITY): Payer: Self-pay | Admitting: Psychiatry

## 2024-03-08 ENCOUNTER — Telehealth (HOSPITAL_BASED_OUTPATIENT_CLINIC_OR_DEPARTMENT_OTHER): Admitting: Psychiatry

## 2024-03-08 VITALS — Wt 268.0 lb

## 2024-03-08 DIAGNOSIS — F4310 Post-traumatic stress disorder, unspecified: Secondary | ICD-10-CM | POA: Diagnosis not present

## 2024-03-08 DIAGNOSIS — F322 Major depressive disorder, single episode, severe without psychotic features: Secondary | ICD-10-CM

## 2024-03-08 DIAGNOSIS — F411 Generalized anxiety disorder: Secondary | ICD-10-CM

## 2024-03-08 MED ORDER — TRAZODONE HCL 100 MG PO TABS: 100.0000 mg | ORAL_TABLET | Freq: Every day | ORAL | 0 refills | Status: DC

## 2024-03-08 MED ORDER — HYDROXYZINE HCL 25 MG PO TABS: 25.0000 mg | ORAL_TABLET | Freq: Three times a day (TID) | ORAL | 2 refills | Status: DC

## 2024-03-08 MED ORDER — DULOXETINE HCL 60 MG PO CPEP: 60.0000 mg | ORAL_CAPSULE | Freq: Every day | ORAL | 0 refills | Status: DC
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
## 2024-06-07 ENCOUNTER — Encounter (HOSPITAL_COMMUNITY): Payer: Self-pay | Admitting: Psychiatry

## 2024-06-07 ENCOUNTER — Telehealth (HOSPITAL_BASED_OUTPATIENT_CLINIC_OR_DEPARTMENT_OTHER): Admitting: Psychiatry

## 2024-06-07 VITALS — Wt 273.0 lb

## 2024-06-07 DIAGNOSIS — F5105 Insomnia due to other mental disorder: Secondary | ICD-10-CM

## 2024-06-07 DIAGNOSIS — F322 Major depressive disorder, single episode, severe without psychotic features: Secondary | ICD-10-CM

## 2024-06-07 DIAGNOSIS — F99 Mental disorder, not otherwise specified: Secondary | ICD-10-CM | POA: Diagnosis not present

## 2024-06-07 DIAGNOSIS — F411 Generalized anxiety disorder: Secondary | ICD-10-CM

## 2024-06-07 DIAGNOSIS — F4310 Post-traumatic stress disorder, unspecified: Secondary | ICD-10-CM

## 2024-06-07 MED ORDER — HYDROXYZINE HCL 25 MG PO TABS: 25.0000 mg | ORAL_TABLET | Freq: Three times a day (TID) | ORAL | 2 refills | Status: DC

## 2024-06-07 MED ORDER — DULOXETINE HCL 60 MG PO CPEP: 60.0000 mg | ORAL_CAPSULE | Freq: Every day | ORAL | 0 refills | Status: DC

## 2024-06-07 MED ORDER — TRAZODONE HCL 100 MG PO TABS: 100.0000 mg | ORAL_TABLET | Freq: Every day | ORAL | 0 refills | Status: DC
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
## 2024-06-22 DIAGNOSIS — Z13220 Encounter for screening for lipoid disorders: Secondary | ICD-10-CM | POA: Diagnosis not present

## 2024-06-22 DIAGNOSIS — R7303 Prediabetes: Secondary | ICD-10-CM | POA: Diagnosis not present

## 2024-06-29 ENCOUNTER — Ambulatory Visit (HOSPITAL_COMMUNITY): Admitting: Licensed Clinical Social Worker

## 2024-07-11 ENCOUNTER — Ambulatory Visit (HOSPITAL_COMMUNITY): Admitting: Licensed Clinical Social Worker

## 2024-08-08 ENCOUNTER — Ambulatory Visit (HOSPITAL_COMMUNITY): Admitting: Licensed Clinical Social Worker

## 2024-08-12 DIAGNOSIS — S6392XA Sprain of unspecified part of left wrist and hand, initial encounter: Secondary | ICD-10-CM | POA: Diagnosis not present

## 2024-08-17 DIAGNOSIS — M79642 Pain in left hand: Secondary | ICD-10-CM | POA: Diagnosis not present

## 2024-08-17 DIAGNOSIS — M654 Radial styloid tenosynovitis [de Quervain]: Secondary | ICD-10-CM | POA: Diagnosis not present

## 2024-08-29 ENCOUNTER — Encounter (HOSPITAL_COMMUNITY): Payer: Self-pay

## 2024-08-29 ENCOUNTER — Encounter (HOSPITAL_COMMUNITY): Payer: Self-pay | Admitting: Licensed Clinical Social Worker

## 2024-08-29 ENCOUNTER — Ambulatory Visit (INDEPENDENT_AMBULATORY_CARE_PROVIDER_SITE_OTHER): Admitting: Licensed Clinical Social Worker

## 2024-08-29 DIAGNOSIS — F330 Major depressive disorder, recurrent, mild: Secondary | ICD-10-CM

## 2024-08-29 DIAGNOSIS — F411 Generalized anxiety disorder: Secondary | ICD-10-CM | POA: Diagnosis not present
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
## 2024-09-06 ENCOUNTER — Encounter (HOSPITAL_COMMUNITY): Payer: Self-pay

## 2024-09-06 ENCOUNTER — Telehealth (HOSPITAL_COMMUNITY): Payer: Self-pay | Admitting: Psychiatry

## 2024-09-06 DIAGNOSIS — Z91199 Patient's noncompliance with other medical treatment and regimen due to unspecified reason: Secondary | ICD-10-CM

## 2024-09-06 NOTE — Progress Notes (Signed)
 Patient is not on video platform.  Called at [phone] and left a voicemail to reschedule appointment.

## 2024-09-13 ENCOUNTER — Ambulatory Visit (HOSPITAL_COMMUNITY): Admitting: Licensed Clinical Social Worker

## 2024-09-13 DIAGNOSIS — M25532 Pain in left wrist: Secondary | ICD-10-CM | POA: Diagnosis not present

## 2024-09-13 DIAGNOSIS — M654 Radial styloid tenosynovitis [de Quervain]: Secondary | ICD-10-CM | POA: Diagnosis not present

## 2024-09-14 ENCOUNTER — Other Ambulatory Visit (HOSPITAL_COMMUNITY): Payer: Self-pay

## 2024-09-14 DIAGNOSIS — F4310 Post-traumatic stress disorder, unspecified: Secondary | ICD-10-CM

## 2024-09-14 DIAGNOSIS — F322 Major depressive disorder, single episode, severe without psychotic features: Secondary | ICD-10-CM

## 2024-09-14 DIAGNOSIS — F411 Generalized anxiety disorder: Secondary | ICD-10-CM

## 2024-09-14 MED ORDER — DULOXETINE HCL 60 MG PO CPEP: 60.0000 mg | ORAL_CAPSULE | Freq: Every day | ORAL | 0 refills | Status: DC

## 2024-09-21 ENCOUNTER — Ambulatory Visit (HOSPITAL_COMMUNITY): Admitting: Licensed Clinical Social Worker

## 2024-10-12 ENCOUNTER — Ambulatory Visit (HOSPITAL_COMMUNITY): Admitting: Psychiatry

## 2024-10-16 ENCOUNTER — Telehealth (HOSPITAL_COMMUNITY): Admitting: Psychiatry

## 2024-10-16 ENCOUNTER — Encounter (HOSPITAL_COMMUNITY): Payer: Self-pay | Admitting: Psychiatry

## 2024-10-16 VITALS — Wt 260.0 lb

## 2024-10-16 DIAGNOSIS — F322 Major depressive disorder, single episode, severe without psychotic features: Secondary | ICD-10-CM | POA: Diagnosis not present

## 2024-10-16 DIAGNOSIS — F411 Generalized anxiety disorder: Secondary | ICD-10-CM

## 2024-10-16 DIAGNOSIS — F4310 Post-traumatic stress disorder, unspecified: Secondary | ICD-10-CM

## 2024-10-16 MED ORDER — DULOXETINE HCL 60 MG PO CPEP: 60.0000 mg | ORAL_CAPSULE | Freq: Every day | ORAL | 0 refills | Status: AC

## 2024-10-16 MED ORDER — TRAZODONE HCL 100 MG PO TABS: 100.0000 mg | ORAL_TABLET | Freq: Every day | ORAL | 0 refills | Status: AC

## 2024-10-16 MED ORDER — HYDROXYZINE HCL 25 MG PO TABS: 25.0000 mg | ORAL_TABLET | Freq: Two times a day (BID) | ORAL | 2 refills | Status: AC
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
## 2024-10-17 ENCOUNTER — Ambulatory Visit (INDEPENDENT_AMBULATORY_CARE_PROVIDER_SITE_OTHER): Admitting: Licensed Clinical Social Worker

## 2024-10-17 ENCOUNTER — Encounter (HOSPITAL_COMMUNITY): Payer: Self-pay | Admitting: Licensed Clinical Social Worker

## 2024-10-17 DIAGNOSIS — F411 Generalized anxiety disorder: Secondary | ICD-10-CM

## 2024-10-17 NOTE — Progress Notes (Signed)
" ° °  THERAPIST PROGRESS NOTE  Session Time: 1:30pm-2:25pm  Participation Level: Active  Behavioral Response: Well GroomedAlertEuthymic  Type of Therapy: Individual Therapy  Treatment Goals addressed:   Active     Anxiety     LTG: Olyn will score less than 5 on the Generalized Anxiety Disorder 7 Scale (GAD-7)      Start:  08/29/24    Expected End:  08/28/25         STG: Lorrene will practice problem solving skills 3 times per week for the next 4 weeks.      Start:  08/29/24    Expected End:  08/28/25            ProgressTowards Goals: Progressing  Interventions: CBT  Summary: KEJUAN BEKKER is a 49 y.o. male who presents with GAD.   Suicidal/Homicidal: Nowithout intent/plan  Therapist Response: Page engaged well in individual in person session with clinician. Clinician utilized CBT to process thoughts, feelings, and interactions. Clinician processed work with dad as he declines into dementia. Clinician provided feedback about getting a CNA to care for dad while Holton is at work or if he wants to get out of town. Clinician encouraged Rashawn [facility] meets criteria for increased [facility].  Clinician explored Kavonte's anxiety levels and noted that he maintains an alarm on his phone that will let him know if the doors to the house open or if anything happens in the home. Clinician reflected that this is quite disruptive to his routine and his work. Clinician discussed coping skills and identified that [place], sits [place] swings, and people watches. Clinician identified the importance of maintaining a supportive group of friends. Jaquavius reports he continues to struggle to sleep for more than 3-4 hours per night.   Plan: Return again in 4 weeks.  Diagnosis: Generalized anxiety disorder  Collaboration of Care: Psychiatrist AEB reviewed note from Dr. Arfeen.   Patient/Guardian was advised Release of Information must be  obtained prior to any record release in order to collaborate their care with an outside provider. Patient/Guardian was advised if they have not already done so to contact the registration department to sign all necessary forms in order for us  to release information regarding their care.   Consent: Patient/Guardian gives verbal consent for treatment and assignment of benefits for services provided during this visit. Patient/Guardian expressed understanding and agreed to proceed.   Harlene JONELLE Rosser, LCSW 10/17/2024  "

## 2024-10-30 ENCOUNTER — Telehealth: Payer: Self-pay

## 2024-10-30 DIAGNOSIS — F4310 Post-traumatic stress disorder, unspecified: Secondary | ICD-10-CM

## 2024-10-30 NOTE — Progress Notes (Signed)
 Complex Care Management Note  Care Guide Note 10/30/2024 Name: ASHFORD CLOUSE MRN: [id number] DOB: 28-May-1976  Lorrene LITTIE Public is a 49 y.o. year old male who sees Sim, Emery LITTIE, MD for primary care. I reached out to Lorrene LITTIE Public by phone today to offer complex care management services.  Mr. Cephas was given information about Complex Care Management services today including:   The Complex Care Management services include support from the care team which includes your Nurse Care Manager, Clinical Social Worker, or Pharmacist.  The Complex Care Management team is here to help remove barriers to the health concerns and goals most important to you. Complex Care Management services are voluntary, and the patient may decline or stop services at any time by request to their care team member.   Complex Care Management Consent Status: Patient did not agree to participate in complex care management services at this time.  Follow up plan:  Patient will follow up with PCP.  Encounter Outcome:  Patient Refused  Dreama Agent [facility], [facility] Assistant Direct Dial: [phone]  Fax: [phone]

## 2024-11-14 ENCOUNTER — Ambulatory Visit (HOSPITAL_COMMUNITY): Admitting: Licensed Clinical Social Worker

## 2025-01-15 ENCOUNTER — Telehealth (HOSPITAL_COMMUNITY): Admitting: Psychiatry
